# Patient Record
Sex: Male | Born: 1935
Health system: Southern US, Community
[De-identification: ages and names within clinical notes are randomized; demographics above are authoritative.]

## PROBLEM LIST (undated history)

## (undated) DIAGNOSIS — C801 Malignant (primary) neoplasm, unspecified: Secondary | ICD-10-CM

## (undated) DIAGNOSIS — E785 Hyperlipidemia, unspecified: Secondary | ICD-10-CM

## (undated) DIAGNOSIS — K4090 Unilateral inguinal hernia, without obstruction or gangrene, not specified as recurrent: Secondary | ICD-10-CM

## (undated) DIAGNOSIS — IMO0001 Reserved for inherently not codable concepts without codable children: Secondary | ICD-10-CM

## (undated) DIAGNOSIS — C61 Malignant neoplasm of prostate: Secondary | ICD-10-CM

## (undated) DIAGNOSIS — M1990 Unspecified osteoarthritis, unspecified site: Secondary | ICD-10-CM

## (undated) DIAGNOSIS — G9340 Encephalopathy, unspecified: Secondary | ICD-10-CM

## (undated) DIAGNOSIS — D649 Anemia, unspecified: Secondary | ICD-10-CM

## (undated) DIAGNOSIS — I10 Essential (primary) hypertension: Secondary | ICD-10-CM

## (undated) HISTORY — DX: Malignant (primary) neoplasm, unspecified: C80.1

## (undated) HISTORY — DX: Encephalopathy, unspecified: G93.40

## (undated) HISTORY — DX: Hyperlipidemia, unspecified: E78.5

## (undated) HISTORY — DX: Unilateral inguinal hernia, without obstruction or gangrene, not specified as recurrent: K40.90

## (undated) HISTORY — PX: PROSTATE SURGERY: SHX751

## (undated) HISTORY — PX: HERNIA REPAIR: SHX51

## (undated) HISTORY — DX: Anemia, unspecified: D64.9

---

## 1999-10-14 DIAGNOSIS — C801 Malignant (primary) neoplasm, unspecified: Secondary | ICD-10-CM

## 1999-10-14 HISTORY — DX: Malignant (primary) neoplasm, unspecified: C80.1

## 1999-10-14 HISTORY — PX: COLON SURGERY: SHX602

## 2008-05-01 ENCOUNTER — Encounter: Admission: RE | Admit: 2008-05-01 | Discharge: 2008-07-04 | Payer: Self-pay | Admitting: Family Medicine

## 2010-11-26 ENCOUNTER — Inpatient Hospital Stay (HOSPITAL_COMMUNITY)
Admission: EM | Admit: 2010-11-26 | Discharge: 2010-11-29 | DRG: 378 | Disposition: A | Discharge status: Home / Self Care | Payer: Medicare Other | Attending: Internal Medicine | Admitting: Internal Medicine

## 2010-11-26 ENCOUNTER — Emergency Department (HOSPITAL_COMMUNITY): Payer: Medicare Other

## 2010-11-26 DIAGNOSIS — E119 Type 2 diabetes mellitus without complications: Secondary | ICD-10-CM | POA: Diagnosis present

## 2010-11-26 DIAGNOSIS — Z9049 Acquired absence of other specified parts of digestive tract: Secondary | ICD-10-CM

## 2010-11-26 DIAGNOSIS — E876 Hypokalemia: Secondary | ICD-10-CM | POA: Diagnosis not present

## 2010-11-26 DIAGNOSIS — D62 Acute posthemorrhagic anemia: Secondary | ICD-10-CM | POA: Diagnosis present

## 2010-11-26 DIAGNOSIS — D126 Benign neoplasm of colon, unspecified: Secondary | ICD-10-CM | POA: Diagnosis present

## 2010-11-26 DIAGNOSIS — R55 Syncope and collapse: Secondary | ICD-10-CM | POA: Diagnosis present

## 2010-11-26 DIAGNOSIS — K644 Residual hemorrhoidal skin tags: Secondary | ICD-10-CM | POA: Diagnosis present

## 2010-11-26 DIAGNOSIS — Z79899 Other long term (current) drug therapy: Secondary | ICD-10-CM

## 2010-11-26 DIAGNOSIS — Z7982 Long term (current) use of aspirin: Secondary | ICD-10-CM

## 2010-11-26 DIAGNOSIS — Z8546 Personal history of malignant neoplasm of prostate: Secondary | ICD-10-CM

## 2010-11-26 DIAGNOSIS — K5731 Diverticulosis of large intestine without perforation or abscess with bleeding: Principal | ICD-10-CM | POA: Diagnosis present

## 2010-11-26 DIAGNOSIS — I10 Essential (primary) hypertension: Secondary | ICD-10-CM | POA: Diagnosis present

## 2010-11-26 DIAGNOSIS — K648 Other hemorrhoids: Secondary | ICD-10-CM | POA: Diagnosis present

## 2010-11-26 DIAGNOSIS — D72829 Elevated white blood cell count, unspecified: Secondary | ICD-10-CM | POA: Diagnosis present

## 2010-11-26 DIAGNOSIS — E860 Dehydration: Secondary | ICD-10-CM | POA: Diagnosis present

## 2010-11-26 DIAGNOSIS — Z85038 Personal history of other malignant neoplasm of large intestine: Secondary | ICD-10-CM

## 2010-11-26 LAB — APTT: aPTT: 29 seconds (ref 24–37)

## 2010-11-26 LAB — BASIC METABOLIC PANEL
BUN: 15 mg/dL (ref 6–23)
CO2: 28 mEq/L (ref 19–32)
Calcium: 10.2 mg/dL (ref 8.4–10.5)
Chloride: 108 mEq/L (ref 96–112)
Creatinine, Ser: 1.16 mg/dL (ref 0.4–1.5)
GFR calc Af Amer: >60 mL/min (ref >60)
GFR calc non Af Amer: >60 mL/min (ref >60)
Glucose, Bld: 252 mg/dL — ABNORMAL HIGH (ref 70–99)
Potassium: 3.5 mEq/L (ref 3.5–5.1)
Sodium: 141 mEq/L (ref 135–145)

## 2010-11-26 LAB — CK TOTAL AND CKMB (NOT AT ARMC)
CK, MB: 2.1 ng/mL (ref 0.3–4.0)
Relative Index: INVALID (ref 0.0–2.5)
Total CK: 75 U/L (ref 7–232)

## 2010-11-26 LAB — DIFFERENTIAL
Basophils Absolute: 0 10*3/uL (ref 0.0–0.1)
Basophils Relative: 0 % (ref 0–1)
Eosinophils Absolute: 0.2 10*3/uL (ref 0.0–0.7)
Eosinophils Relative: 1 % (ref 0–5)
Lymphocytes Relative: 24 % (ref 12–46)
Lymphs Abs: 3 10*3/uL (ref 0.7–4.0)
Monocytes Absolute: 0.4 10*3/uL (ref 0.1–1.0)
Monocytes Relative: 4 % (ref 3–12)
Neutro Abs: 8.7 10*3/uL — ABNORMAL HIGH (ref 1.7–7.7)
Neutrophils Relative %: 71 % (ref 43–77)

## 2010-11-26 LAB — CBC
HCT: 35.4 % — ABNORMAL LOW (ref 39.0–52.0)
Hemoglobin: 11.9 g/dL — ABNORMAL LOW (ref 13.0–17.0)
MCH: 30.4 pg (ref 26.0–34.0)
MCHC: 33.6 g/dL (ref 30.0–36.0)
MCV: 90.5 fL (ref 78.0–100.0)
Platelets: 210 10*3/uL (ref 150–400)
RBC: 3.91 MIL/uL — ABNORMAL LOW (ref 4.22–5.81)
RDW: 12.4 % (ref 11.5–15.5)
WBC: 12.3 10*3/uL — ABNORMAL HIGH (ref 4.0–10.5)

## 2010-11-26 LAB — TROPONIN I: Troponin I: 0.01 ng/mL (ref 0.00–0.06)

## 2010-11-26 LAB — CEA: CEA: 1.2 ng/mL (ref 0.0–5.0)

## 2010-11-26 LAB — GLUCOSE, CAPILLARY: Glucose-Capillary: 141 mg/dL — ABNORMAL HIGH (ref 70–99)

## 2010-11-26 LAB — PROTIME-INR
INR: 1.18 (ref 0.00–1.49)
Prothrombin Time: 15.2 seconds (ref 11.6–15.2)

## 2010-11-26 LAB — ABO/RH: ABO/RH(D): A POS

## 2010-11-27 DIAGNOSIS — R55 Syncope and collapse: Secondary | ICD-10-CM

## 2010-11-27 DIAGNOSIS — I517 Cardiomegaly: Secondary | ICD-10-CM

## 2010-11-27 LAB — HEMOGLOBIN A1C
Hgb A1c MFr Bld: 5.9 % — ABNORMAL HIGH (ref <5.7)
Mean Plasma Glucose: 123 mg/dL — ABNORMAL HIGH (ref <117)

## 2010-11-27 LAB — COMPREHENSIVE METABOLIC PANEL
ALT: 15 U/L (ref 0–53)
AST: 19 U/L (ref 0–37)
Albumin: 2.9 g/dL — ABNORMAL LOW (ref 3.5–5.2)
Alkaline Phosphatase: 38 U/L — ABNORMAL LOW (ref 39–117)
BUN: 12 mg/dL (ref 6–23)
CO2: 25 mEq/L (ref 19–32)
Calcium: 9.3 mg/dL (ref 8.4–10.5)
Chloride: 112 mEq/L (ref 96–112)
Creatinine, Ser: 0.94 mg/dL (ref 0.4–1.5)
GFR calc Af Amer: >60 mL/min (ref >60)
GFR calc non Af Amer: >60 mL/min (ref >60)
Glucose, Bld: 146 mg/dL — ABNORMAL HIGH (ref 70–99)
Potassium: 3.5 mEq/L (ref 3.5–5.1)
Sodium: 140 mEq/L (ref 135–145)
Total Bilirubin: 0.5 mg/dL (ref 0.3–1.2)
Total Protein: 5.4 g/dL — ABNORMAL LOW (ref 6.0–8.3)

## 2010-11-27 LAB — DIFFERENTIAL
Basophils Absolute: 0 10*3/uL (ref 0.0–0.1)
Basophils Relative: 0 % (ref 0–1)
Eosinophils Absolute: 0.1 10*3/uL (ref 0.0–0.7)
Eosinophils Relative: 2 % (ref 0–5)
Lymphocytes Relative: 25 % (ref 12–46)
Lymphs Abs: 1.7 10*3/uL (ref 0.7–4.0)
Monocytes Absolute: 0.5 10*3/uL (ref 0.1–1.0)
Monocytes Relative: 7 % (ref 3–12)
Neutro Abs: 4.5 10*3/uL (ref 1.7–7.7)
Neutrophils Relative %: 66 % (ref 43–77)

## 2010-11-27 LAB — CARDIAC PANEL(CRET KIN+CKTOT+MB+TROPI)
CK, MB: 1.9 ng/mL (ref 0.3–4.0)
CK, MB: 1.5 ng/mL (ref 0.3–4.0)
Relative Index: INVALID (ref 0.0–2.5)
Relative Index: INVALID (ref 0.0–2.5)
Total CK: 70 U/L (ref 7–232)
Total CK: 67 U/L (ref 7–232)
Troponin I: 0.01 ng/mL (ref 0.00–0.06)
Troponin I: 0.01 ng/mL (ref 0.00–0.06)

## 2010-11-27 LAB — GLUCOSE, CAPILLARY
Glucose-Capillary: 139 mg/dL — ABNORMAL HIGH (ref 70–99)
Glucose-Capillary: 149 mg/dL — ABNORMAL HIGH (ref 70–99)
Glucose-Capillary: 165 mg/dL — ABNORMAL HIGH (ref 70–99)
Glucose-Capillary: 111 mg/dL — ABNORMAL HIGH (ref 70–99)

## 2010-11-27 LAB — CBC
HCT: 27.6 % — ABNORMAL LOW (ref 39.0–52.0)
Hemoglobin: 9.3 g/dL — ABNORMAL LOW (ref 13.0–17.0)
MCH: 30.4 pg (ref 26.0–34.0)
MCHC: 33.7 g/dL (ref 30.0–36.0)
MCV: 90.2 fL (ref 78.0–100.0)
Platelets: 145 10*3/uL — ABNORMAL LOW (ref 150–400)
RBC: 3.06 MIL/uL — ABNORMAL LOW (ref 4.22–5.81)
RDW: 12.6 % (ref 11.5–15.5)
WBC: 6.8 10*3/uL (ref 4.0–10.5)

## 2010-11-27 LAB — MAGNESIUM: Magnesium: 1.7 mg/dL (ref 1.5–2.5)

## 2010-11-28 ENCOUNTER — Other Ambulatory Visit: Payer: Self-pay | Admitting: Gastroenterology

## 2010-11-28 LAB — GLUCOSE, CAPILLARY
Glucose-Capillary: 135 mg/dL — ABNORMAL HIGH (ref 70–99)
Glucose-Capillary: 130 mg/dL — ABNORMAL HIGH (ref 70–99)
Glucose-Capillary: 109 mg/dL — ABNORMAL HIGH (ref 70–99)
Glucose-Capillary: 151 mg/dL — ABNORMAL HIGH (ref 70–99)

## 2010-11-28 LAB — BASIC METABOLIC PANEL
BUN: 6 mg/dL (ref 6–23)
CO2: 25 mEq/L (ref 19–32)
Calcium: 9.1 mg/dL (ref 8.4–10.5)
Chloride: 113 mEq/L — ABNORMAL HIGH (ref 96–112)
Creatinine, Ser: 1 mg/dL (ref 0.4–1.5)
GFR calc Af Amer: >60 mL/min (ref >60)
GFR calc non Af Amer: >60 mL/min (ref >60)
Glucose, Bld: 141 mg/dL — ABNORMAL HIGH (ref 70–99)
Potassium: 3.4 mEq/L — ABNORMAL LOW (ref 3.5–5.1)
Sodium: 144 mEq/L (ref 135–145)

## 2010-11-28 LAB — CBC
HCT: 26.8 % — ABNORMAL LOW (ref 39.0–52.0)
Hemoglobin: 9 g/dL — ABNORMAL LOW (ref 13.0–17.0)
MCH: 30.2 pg (ref 26.0–34.0)
MCHC: 33.6 g/dL (ref 30.0–36.0)
MCV: 89.9 fL (ref 78.0–100.0)
Platelets: 144 10*3/uL — ABNORMAL LOW (ref 150–400)
RBC: 2.98 MIL/uL — ABNORMAL LOW (ref 4.22–5.81)
RDW: 12.5 % (ref 11.5–15.5)
WBC: 5.9 10*3/uL (ref 4.0–10.5)

## 2010-11-29 LAB — CROSSMATCH
ABO/RH(D): A POS
Antibody Screen: NEGATIVE
Unit division: 0
Unit division: 0

## 2010-11-29 LAB — CBC
HCT: 31.4 % — ABNORMAL LOW (ref 39.0–52.0)
Hemoglobin: 10.5 g/dL — ABNORMAL LOW (ref 13.0–17.0)
MCH: 30.2 pg (ref 26.0–34.0)
MCHC: 33.4 g/dL (ref 30.0–36.0)
MCV: 90.2 fL (ref 78.0–100.0)
Platelets: 150 10*3/uL (ref 150–400)
RBC: 3.48 MIL/uL — ABNORMAL LOW (ref 4.22–5.81)
RDW: 13 % (ref 11.5–15.5)
WBC: 5.9 10*3/uL (ref 4.0–10.5)

## 2010-11-29 LAB — GLUCOSE, CAPILLARY: Glucose-Capillary: 135 mg/dL — ABNORMAL HIGH (ref 70–99)

## 2011-11-03 DIAGNOSIS — E119 Type 2 diabetes mellitus without complications: Secondary | ICD-10-CM | POA: Diagnosis not present

## 2011-11-03 DIAGNOSIS — E785 Hyperlipidemia, unspecified: Secondary | ICD-10-CM | POA: Diagnosis not present

## 2011-11-03 DIAGNOSIS — I10 Essential (primary) hypertension: Secondary | ICD-10-CM | POA: Diagnosis not present

## 2012-01-30 ENCOUNTER — Ambulatory Visit (INDEPENDENT_AMBULATORY_CARE_PROVIDER_SITE_OTHER): Payer: Medicare Other | Admitting: Emergency Medicine

## 2012-01-30 VITALS — BP 132/75 | HR 62 | Temp 98.4°F | Resp 16 | Ht 72.5 in | Wt 186.2 lb

## 2012-01-30 DIAGNOSIS — E119 Type 2 diabetes mellitus without complications: Secondary | ICD-10-CM

## 2012-01-30 DIAGNOSIS — E785 Hyperlipidemia, unspecified: Secondary | ICD-10-CM

## 2012-01-30 DIAGNOSIS — I10 Essential (primary) hypertension: Secondary | ICD-10-CM | POA: Diagnosis not present

## 2012-01-30 DIAGNOSIS — E0590 Thyrotoxicosis, unspecified without thyrotoxic crisis or storm: Secondary | ICD-10-CM | POA: Insufficient documentation

## 2012-01-30 DIAGNOSIS — L700 Acne vulgaris: Secondary | ICD-10-CM

## 2012-01-30 DIAGNOSIS — K469 Unspecified abdominal hernia without obstruction or gangrene: Secondary | ICD-10-CM | POA: Diagnosis not present

## 2012-01-30 DIAGNOSIS — E118 Type 2 diabetes mellitus with unspecified complications: Secondary | ICD-10-CM | POA: Insufficient documentation

## 2012-01-30 LAB — COMPREHENSIVE METABOLIC PANEL
ALT: 11 U/L (ref 0–53)
AST: 17 U/L (ref 0–37)
Albumin: 4.3 g/dL (ref 3.5–5.2)
Alkaline Phosphatase: 54 U/L (ref 39–117)
BUN: 17 mg/dL (ref 6–23)
CO2: 28 mEq/L (ref 19–32)
Calcium: 10.3 mg/dL (ref 8.4–10.5)
Chloride: 102 mEq/L (ref 96–112)
Creat: 1.27 mg/dL (ref 0.50–1.35)
Glucose, Bld: 146 mg/dL — ABNORMAL HIGH (ref 70–99)
Potassium: 4.1 mEq/L (ref 3.5–5.3)
Sodium: 138 mEq/L (ref 135–145)
Total Bilirubin: 0.7 mg/dL (ref 0.3–1.2)
Total Protein: 7.5 g/dL (ref 6.0–8.3)

## 2012-01-30 LAB — POCT GLYCOSYLATED HEMOGLOBIN (HGB A1C): Hemoglobin A1C: 7.3

## 2012-01-30 LAB — POCT CBC
Granulocyte percent: 65.9 %G (ref 37–80)
HCT, POC: 42.2 % — AB (ref 43.5–53.7)
Hemoglobin: 13.6 g/dL — AB (ref 14.1–18.1)
Lymph, poc: 1.9 (ref 0.6–3.4)
MCH, POC: 27.6 pg (ref 27–31.2)
MCHC: 32.2 g/dL (ref 31.8–35.4)
MCV: 85.6 fL (ref 80–97)
MID (cbc): 0.4 (ref 0–0.9)
MPV: 9.2 fL (ref 0–99.8)
POC Granulocyte: 4.4 (ref 2–6.9)
POC LYMPH PERCENT: 28.5 %L (ref 10–50)
POC MID %: 5.6 %M (ref 0–12)
Platelet Count, POC: 234 10*3/uL (ref 142–424)
RBC: 4.93 M/uL (ref 4.69–6.13)
RDW, POC: 14.7 %
WBC: 6.7 10*3/uL (ref 4.6–10.2)

## 2012-01-30 LAB — LIPID PANEL
Cholesterol: 152 mg/dL (ref 0–200)
HDL: 49 mg/dL (ref >39)
LDL Cholesterol: 85 mg/dL (ref 0–99)
Total CHOL/HDL Ratio: 3.1 Ratio
Triglycerides: 90 mg/dL (ref <150)
VLDL: 18 mg/dL (ref 0–40)

## 2012-01-30 LAB — GLUCOSE, POCT (MANUAL RESULT ENTRY): POC Glucose: 148

## 2012-01-30 MED ORDER — AMLODIPINE BESYLATE 10 MG PO TABS: 10.0000 mg | ORAL_TABLET | Freq: Every day | ORAL | Status: DC

## 2012-01-30 MED ORDER — ATORVASTATIN CALCIUM 10 MG PO TABS: 10.0000 mg | ORAL_TABLET | Freq: Every day | ORAL | Status: DC

## 2012-01-30 MED ORDER — METFORMIN HCL 500 MG PO TABS: 500.0000 mg | ORAL_TABLET | Freq: Two times a day (BID) | ORAL | Status: DC

## 2012-01-30 MED ORDER — METHIMAZOLE 10 MG PO TABS: 10.0000 mg | ORAL_TABLET | ORAL | Status: DC

## 2012-01-30 MED ORDER — LISINOPRIL-HYDROCHLOROTHIAZIDE 20-12.5 MG PO TABS: 1.0000 | ORAL_TABLET | Freq: Every day | ORAL | Status: DC

## 2012-01-30 NOTE — Progress Notes (Signed)
  Subjective:    Patient ID: Theodore Frank, male    DOB: 1936-09-08, 76 y.o.   MRN: [id number]  HPI patient has a history high cholesterol high blood pressure hyperlipidemia and hypothyroidism. He is a patient [facility] is however has been unable to get his medications since [facility] closed in today for recheck of his blood pressure and refill of his medications.    Review of Systems he states he feels well he is not having chest pain shortness of breath or any complaints at the present time.     Objective:   Physical Exam Physical exam reveals an alert gentleman who is in no distress. His neck is supple thyroid is not enlarged. Chest is clear to auscultation and percussion. Heart regular rate no murmurs. Abdomen soft no tenderness. There is a probable right inguinal hernia present. There is a large comedone over his back . He does have a midline scar lower abdomen where he says he's had surgery for colon cancer.       Assessment & Plan:  Meds refills for one year. I placed him on a 10 mg dose of Tapazole until I get his records. I did do thyroid studies. He is a history of colon: Cancer and is followed by Dr. Loreta Ave.

## 2012-02-23 DIAGNOSIS — L723 Sebaceous cyst: Secondary | ICD-10-CM | POA: Diagnosis not present

## 2012-02-25 ENCOUNTER — Encounter (INDEPENDENT_AMBULATORY_CARE_PROVIDER_SITE_OTHER): Payer: Self-pay | Admitting: General Surgery

## 2012-02-25 ENCOUNTER — Telehealth (INDEPENDENT_AMBULATORY_CARE_PROVIDER_SITE_OTHER): Payer: Self-pay | Admitting: General Surgery

## 2012-02-25 ENCOUNTER — Ambulatory Visit (INDEPENDENT_AMBULATORY_CARE_PROVIDER_SITE_OTHER): Payer: Medicare Other | Admitting: General Surgery

## 2012-02-25 VITALS — BP 110/78 | HR 72 | Temp 97.7°F | Resp 18 | Ht 74.0 in | Wt 180.5 lb

## 2012-02-25 DIAGNOSIS — K4090 Unilateral inguinal hernia, without obstruction or gangrene, not specified as recurrent: Secondary | ICD-10-CM | POA: Diagnosis not present

## 2012-02-25 HISTORY — DX: Unilateral inguinal hernia, without obstruction or gangrene, not specified as recurrent: K40.90

## 2012-02-25 NOTE — Progress Notes (Signed)
Patient ID: Theodore Frank, male   DOB: 12/29/1935, 76 y.o.   MRN: [id number]  Chief Complaint  Patient presents with  . Inguinal Hernia    HPI Vijay Lederman is a 76 y.o. male.   [facility] little over a year ago patient developed some pain in his right groin. He was evaluated and found to have a small right inguinal hernia. He saw a physician regarding repair at that time. It was felt to be small. The surgery was scheduled. More recently, he developed an upper respiratory infection which involve a lot of coughing. The hernia became larger. He was seen by Dr. Daub and referred for consultation regarding right inguinal hernia.Localized pain but no change in bowel or bladder habits.  Past Medical History  Diagnosis Date  . Cancer     colon  . Diabetes mellitus   . Hyperlipidemia     No past surgical history on file.  No family history on file.  Social History History  Substance Use Topics  . Smoking status: Former Smoker    Quit date: 10/13/1965  . Smokeless tobacco: Not on file  . Alcohol Use: No    No Known Allergies  Current Outpatient Prescriptions  Medication Sig Dispense Refill  . amLODipine (NORVASC) 10 MG tablet Take 1 tablet (10 mg total) by mouth daily.  90 tablet  3  . atorvastatin (LIPITOR) 10 MG tablet Take 1 tablet (10 mg total) by mouth daily.  90 tablet  3  . lisinopril-hydrochlorothiazide (PRINZIDE,ZESTORETIC) 20-12.5 MG per tablet Take 1 tablet by mouth daily.  90 tablet  3  . metFORMIN (GLUCOPHAGE) 500 MG tablet Take 1 tablet (500 mg total) by mouth 2 (two) times daily with a meal.  180 tablet  3  . methimazole (TAPAZOLE) 10 MG tablet Take 1 tablet (10 mg total) by mouth 1 day or 1 dose.  90 tablet  3    Review of Systems Review of Systems  Constitutional: Negative for fever, chills and unexpected weight change.  HENT: Negative for hearing loss, congestion, sore throat, trouble swallowing and voice change.   Eyes: Negative for visual disturbance.  Respiratory:  Negative for cough and wheezing.   Cardiovascular: Negative for chest pain, palpitations and leg swelling.  Gastrointestinal: Negative for nausea, vomiting, abdominal pain, diarrhea, constipation, blood in stool, abdominal distention, anal bleeding and rectal pain.  Genitourinary: Negative for hematuria and difficulty urinating.       Erectile dysfunction, right inguinal hernia  Musculoskeletal: Negative for arthralgias.  Skin: Negative for rash and wound.  Neurological: Negative for seizures, syncope, weakness and headaches.  Hematological: Negative for adenopathy. Does not bruise/bleed easily.  Psychiatric/Behavioral: Negative for confusion.    Blood pressure 110/78, pulse 72, temperature 97.7 F (36.5 C), temperature source Temporal, resp. rate 18, height 6' 2" (1.88 m), weight 180 lb 8 oz (81.874 kg).  Physical Exam Physical Exam  Constitutional: He is oriented to person, place, and time. He appears well-developed and well-nourished. No distress.  HENT:  Head: Normocephalic and atraumatic.  Eyes: EOM are normal. Pupils are equal, round, and reactive to light. No scleral icterus.  Neck: Normal range of motion. Neck supple. No tracheal deviation present.  Cardiovascular: Normal rate, regular rhythm and normal heart sounds.   No murmur heard. Pulmonary/Chest: Effort normal and breath sounds normal. No stridor. No respiratory distress. He has no wheezes. He has no rales.  Abdominal: Soft. Bowel sounds are normal. He exhibits no distension. There is no tenderness. There is no rebound and   no guarding.       Inguinal exam reveals small right inguinal hernia which reduces easily but recurs spontaneously, left inguinal exam reveals no hernia, Testes are both descended and nonedematous  Genitourinary: Penis normal.  Musculoskeletal: Normal range of motion. He exhibits no edema.  Neurological: He is alert and oriented to person, place, and time. Coordination normal.  Skin: Skin is warm and  dry.    Data Reviewed Office notes  Assessment    Symptomatic right inguinal hernia    Plan    I have offered right inguinal hernia repair with mesh. Procedure, risks, benefits were discussed in detail with the patient. He was given literature on hernias and the procedure for repair. He is aware he will need to hold off on heavy lifting for 6 weeks after surgery. I answered his questions. He is agreeable.       Mahathi Pokorney E 02/25/2012, 9:48 AM    

## 2012-02-26 ENCOUNTER — Encounter (INDEPENDENT_AMBULATORY_CARE_PROVIDER_SITE_OTHER): Payer: Self-pay

## 2012-03-02 ENCOUNTER — Encounter (HOSPITAL_BASED_OUTPATIENT_CLINIC_OR_DEPARTMENT_OTHER)
Admission: RE | Admit: 2012-03-02 | Discharge: 2012-03-02 | Disposition: A | Discharge status: Home / Self Care | Payer: Medicare Other | Source: Ambulatory Visit | Attending: General Surgery | Admitting: General Surgery

## 2012-03-02 ENCOUNTER — Encounter (HOSPITAL_BASED_OUTPATIENT_CLINIC_OR_DEPARTMENT_OTHER): Payer: Self-pay | Admitting: *Deleted

## 2012-03-02 DIAGNOSIS — E119 Type 2 diabetes mellitus without complications: Secondary | ICD-10-CM | POA: Diagnosis not present

## 2012-03-02 DIAGNOSIS — E0590 Thyrotoxicosis, unspecified without thyrotoxic crisis or storm: Secondary | ICD-10-CM | POA: Diagnosis not present

## 2012-03-02 DIAGNOSIS — Z01810 Encounter for preprocedural cardiovascular examination: Secondary | ICD-10-CM | POA: Diagnosis not present

## 2012-03-02 DIAGNOSIS — I10 Essential (primary) hypertension: Secondary | ICD-10-CM | POA: Diagnosis not present

## 2012-03-02 DIAGNOSIS — E785 Hyperlipidemia, unspecified: Secondary | ICD-10-CM | POA: Diagnosis not present

## 2012-03-02 DIAGNOSIS — K4090 Unilateral inguinal hernia, without obstruction or gangrene, not specified as recurrent: Secondary | ICD-10-CM | POA: Diagnosis not present

## 2012-03-02 DIAGNOSIS — Z85038 Personal history of other malignant neoplasm of large intestine: Secondary | ICD-10-CM | POA: Diagnosis not present

## 2012-03-02 LAB — BASIC METABOLIC PANEL
BUN: 21 mg/dL (ref 6–23)
CO2: 27 mEq/L (ref 19–32)
Calcium: 10.8 mg/dL — ABNORMAL HIGH (ref 8.4–10.5)
Chloride: 103 mEq/L (ref 96–112)
Creatinine, Ser: 1.15 mg/dL (ref 0.50–1.35)
GFR calc Af Amer: 71 mL/min — ABNORMAL LOW (ref >90)
GFR calc non Af Amer: 61 mL/min — ABNORMAL LOW (ref >90)
Glucose, Bld: 133 mg/dL — ABNORMAL HIGH (ref 70–99)
Potassium: 3.7 mEq/L (ref 3.5–5.1)
Sodium: 140 mEq/L (ref 135–145)

## 2012-03-02 NOTE — Progress Notes (Signed)
Pt here ekg and bmet done

## 2012-03-03 NOTE — Progress Notes (Signed)
Dr. Gelene Mink reviewed EKG yesterday--- OK for surgery

## 2012-03-05 ENCOUNTER — Encounter (HOSPITAL_BASED_OUTPATIENT_CLINIC_OR_DEPARTMENT_OTHER): Payer: Self-pay | Admitting: Anesthesiology

## 2012-03-05 ENCOUNTER — Ambulatory Visit (HOSPITAL_BASED_OUTPATIENT_CLINIC_OR_DEPARTMENT_OTHER)
Admission: RE | Admit: 2012-03-05 | Discharge: 2012-03-05 | Disposition: A | Discharge status: Home / Self Care | Payer: Medicare Other | Source: Ambulatory Visit | Attending: General Surgery | Admitting: General Surgery

## 2012-03-05 ENCOUNTER — Ambulatory Visit (HOSPITAL_BASED_OUTPATIENT_CLINIC_OR_DEPARTMENT_OTHER): Payer: Medicare Other | Admitting: Anesthesiology

## 2012-03-05 ENCOUNTER — Encounter (HOSPITAL_BASED_OUTPATIENT_CLINIC_OR_DEPARTMENT_OTHER)
Admission: RE | Disposition: A | Discharge status: Home / Self Care | Payer: Self-pay | Source: Ambulatory Visit | Attending: General Surgery

## 2012-03-05 ENCOUNTER — Encounter (HOSPITAL_BASED_OUTPATIENT_CLINIC_OR_DEPARTMENT_OTHER): Payer: Self-pay | Admitting: *Deleted

## 2012-03-05 DIAGNOSIS — G8918 Other acute postprocedural pain: Secondary | ICD-10-CM | POA: Diagnosis not present

## 2012-03-05 DIAGNOSIS — I10 Essential (primary) hypertension: Secondary | ICD-10-CM | POA: Diagnosis not present

## 2012-03-05 DIAGNOSIS — Z85038 Personal history of other malignant neoplasm of large intestine: Secondary | ICD-10-CM | POA: Insufficient documentation

## 2012-03-05 DIAGNOSIS — E119 Type 2 diabetes mellitus without complications: Secondary | ICD-10-CM | POA: Diagnosis not present

## 2012-03-05 DIAGNOSIS — Z01810 Encounter for preprocedural cardiovascular examination: Secondary | ICD-10-CM | POA: Insufficient documentation

## 2012-03-05 DIAGNOSIS — K4090 Unilateral inguinal hernia, without obstruction or gangrene, not specified as recurrent: Secondary | ICD-10-CM

## 2012-03-05 DIAGNOSIS — E0590 Thyrotoxicosis, unspecified without thyrotoxic crisis or storm: Secondary | ICD-10-CM | POA: Insufficient documentation

## 2012-03-05 DIAGNOSIS — R109 Unspecified abdominal pain: Secondary | ICD-10-CM | POA: Diagnosis not present

## 2012-03-05 DIAGNOSIS — E785 Hyperlipidemia, unspecified: Secondary | ICD-10-CM | POA: Insufficient documentation

## 2012-03-05 HISTORY — DX: Malignant neoplasm of prostate: C61

## 2012-03-05 HISTORY — DX: Essential (primary) hypertension: I10

## 2012-03-05 HISTORY — PX: INGUINAL HERNIA REPAIR: SHX194

## 2012-03-05 LAB — GLUCOSE, CAPILLARY
Glucose-Capillary: 155 mg/dL — ABNORMAL HIGH (ref 70–99)
Glucose-Capillary: 141 mg/dL — ABNORMAL HIGH (ref 70–99)

## 2012-03-05 LAB — POCT HEMOGLOBIN-HEMACUE: Hemoglobin: 11.7 g/dL — ABNORMAL LOW (ref 13.0–17.0)

## 2012-03-05 SURGERY — REPAIR, HERNIA, INGUINAL, ADULT
Anesthesia: General | Site: Groin | Laterality: Right | Wound class: Clean

## 2012-03-05 MED ORDER — EPHEDRINE SULFATE 50 MG/ML IJ SOLN
INTRAMUSCULAR | Status: DC | PRN
  Administered 2012-03-05: 10 mg via INTRAVENOUS

## 2012-03-05 MED ORDER — OXYCODONE-ACETAMINOPHEN 5-325 MG PO TABS: 1.0000 | ORAL_TABLET | ORAL | Status: AC | PRN

## 2012-03-05 MED ORDER — CEFAZOLIN SODIUM-DEXTROSE 2-3 GM-% IV SOLR
2.0000 g | INTRAVENOUS | Status: AC
  Administered 2012-03-05: 2 g via INTRAVENOUS

## 2012-03-05 MED ORDER — CHLORHEXIDINE GLUCONATE 4 % EX LIQD: 1.0000 "application " | Freq: Once | CUTANEOUS | Status: DC

## 2012-03-05 MED ORDER — 0.9 % SODIUM CHLORIDE (POUR BTL) OPTIME
TOPICAL | Status: DC | PRN
  Administered 2012-03-05: 200 mL

## 2012-03-05 MED ORDER — ONDANSETRON HCL 4 MG/2ML IJ SOLN
INTRAMUSCULAR | Status: DC | PRN
  Administered 2012-03-05: 4 mg via INTRAVENOUS

## 2012-03-05 MED ORDER — DEXAMETHASONE SODIUM PHOSPHATE 4 MG/ML IJ SOLN
INTRAMUSCULAR | Status: DC | PRN
  Administered 2012-03-05: 10 mg via INTRAVENOUS

## 2012-03-05 MED ORDER — LACTATED RINGERS IV SOLN
INTRAVENOUS | Status: DC
  Administered 2012-03-05 (×3): via INTRAVENOUS

## 2012-03-05 MED ORDER — FENTANYL CITRATE 0.05 MG/ML IJ SOLN
50.0000 ug | INTRAMUSCULAR | Status: DC | PRN
  Administered 2012-03-05: 50 ug via INTRAVENOUS

## 2012-03-05 MED ORDER — MIDAZOLAM HCL 2 MG/2ML IJ SOLN
0.5000 mg | INTRAMUSCULAR | Status: DC | PRN
  Administered 2012-03-05: 2 mg via INTRAVENOUS

## 2012-03-05 MED ORDER — LIDOCAINE HCL (CARDIAC) 20 MG/ML IV SOLN
INTRAVENOUS | Status: DC | PRN
  Administered 2012-03-05: 50 mg via INTRAVENOUS

## 2012-03-05 MED ORDER — ACETAMINOPHEN 10 MG/ML IV SOLN
1000.0000 mg | Freq: Once | INTRAVENOUS | Status: AC
  Administered 2012-03-05: 1000 mg via INTRAVENOUS

## 2012-03-05 MED ORDER — PROPOFOL 10 MG/ML IV EMUL
INTRAVENOUS | Status: DC | PRN
  Administered 2012-03-05: 200 mg via INTRAVENOUS

## 2012-03-05 MED ORDER — KETOROLAC TROMETHAMINE 30 MG/ML IJ SOLN
INTRAMUSCULAR | Status: DC | PRN
  Administered 2012-03-05: 30 mg via INTRAVENOUS

## 2012-03-05 MED ORDER — BUPIVACAINE-EPINEPHRINE 0.5% -1:200000 IJ SOLN
INTRAMUSCULAR | Status: DC | PRN
  Administered 2012-03-05: 10 mL

## 2012-03-05 MED ORDER — BUPIVACAINE HCL (PF) 0.5 % IJ SOLN
INTRAMUSCULAR | Status: DC | PRN
  Administered 2012-03-05: 20 mL

## 2012-03-05 SURGICAL SUPPLY — 47 items
BLADE HEX COATED 2.75 (ELECTRODE) ×2 IMPLANT
BLADE SURG 10 STRL SS (BLADE) IMPLANT
BLADE SURG 15 STRL LF DISP TIS (BLADE) ×1 IMPLANT
BLADE SURG 15 STRL SS (BLADE) ×1
BLADE SURG ROTATE 9660 (MISCELLANEOUS) ×2 IMPLANT
CANISTER SUCTION 1200CC (MISCELLANEOUS) ×2 IMPLANT
CHLORAPREP W/TINT 26ML (MISCELLANEOUS) ×2 IMPLANT
CLOTH BEACON ORANGE TIMEOUT ST (SAFETY) ×2 IMPLANT
COVER MAYO STAND STRL (DRAPES) ×2 IMPLANT
COVER TABLE BACK 60X90 (DRAPES) ×2 IMPLANT
DECANTER SPIKE VIAL GLASS SM (MISCELLANEOUS) ×2 IMPLANT
DERMABOND ADVANCED (GAUZE/BANDAGES/DRESSINGS) ×1
DERMABOND ADVANCED .7 DNX12 (GAUZE/BANDAGES/DRESSINGS) ×1 IMPLANT
DRAIN PENROSE 1/2X12 LTX STRL (WOUND CARE) ×2 IMPLANT
DRAPE PED LAPAROTOMY (DRAPES) ×2 IMPLANT
DRAPE UTILITY XL STRL (DRAPES) ×2 IMPLANT
ELECT REM PT RETURN 9FT ADLT (ELECTROSURGICAL) ×2
ELECTRODE REM PT RTRN 9FT ADLT (ELECTROSURGICAL) ×1 IMPLANT
GLOVE BIO SURGEON STRL SZ 6.5 (GLOVE) ×4 IMPLANT
GLOVE BIO SURGEON STRL SZ8 (GLOVE) ×2 IMPLANT
GLOVE BIOGEL PI IND STRL 8 (GLOVE) ×1 IMPLANT
GLOVE BIOGEL PI INDICATOR 8 (GLOVE) ×1
GLOVE INDICATOR 7.0 STRL GRN (GLOVE) ×4 IMPLANT
GOWN PREVENTION PLUS XLARGE (GOWN DISPOSABLE) ×2 IMPLANT
GOWN PREVENTION PLUS XXLARGE (GOWN DISPOSABLE) ×2 IMPLANT
MESH HERNIA 3X6 (Mesh General) ×2 IMPLANT
NEEDLE HYPO 25X1 1.5 SAFETY (NEEDLE) ×2 IMPLANT
NS IRRIG 1000ML POUR BTL (IV SOLUTION) IMPLANT
PACK BASIN DAY SURGERY FS (CUSTOM PROCEDURE TRAY) ×2 IMPLANT
PENCIL BUTTON HOLSTER BLD 10FT (ELECTRODE) ×2 IMPLANT
SLEEVE SCD COMPRESS KNEE MED (MISCELLANEOUS) ×2 IMPLANT
SPONGE LAP 18X18 X RAY DECT (DISPOSABLE) IMPLANT
SPONGE LAP 4X18 X RAY DECT (DISPOSABLE) ×2 IMPLANT
SUT MON AB 4-0 PC3 18 (SUTURE) ×2 IMPLANT
SUT PROLENE 0 CT 2 (SUTURE) ×6 IMPLANT
SUT VIC AB 2-0 SH 27 (SUTURE) ×2
SUT VIC AB 2-0 SH 27XBRD (SUTURE) ×2 IMPLANT
SUT VIC AB 3-0 SH 27 (SUTURE) ×1
SUT VIC AB 3-0 SH 27X BRD (SUTURE) ×1 IMPLANT
SUT VICRYL AB 2 0 TIE (SUTURE) IMPLANT
SUT VICRYL AB 2 0 TIES (SUTURE)
SYR CONTROL 10ML LL (SYRINGE) ×2 IMPLANT
TOWEL OR 17X24 6PK STRL BLUE (TOWEL DISPOSABLE) ×2 IMPLANT
TOWEL OR NON WOVEN STRL DISP B (DISPOSABLE) ×2 IMPLANT
TUBE CONNECTING 20X1/4 (TUBING) ×2 IMPLANT
WATER STERILE IRR 1000ML POUR (IV SOLUTION) IMPLANT
YANKAUER SUCT BULB TIP NO VENT (SUCTIONS) ×2 IMPLANT

## 2012-03-05 NOTE — Transfer of Care (Signed)
Immediate Anesthesia Transfer of Care Note  Patient: Theodore Frank  Procedure(s) Performed: Procedure(s) (LRB): HERNIA REPAIR INGUINAL ADULT (Right) INSERTION OF MESH (Right)  Patient Location: PACU  Anesthesia Type: GA combined with regional for post-op pain  Level of Consciousness: sedated  Airway & Oxygen Therapy: Patient Spontanous Breathing and Patient connected to face mask oxygen  Post-op Assessment: Report given to PACU RN and Post -op Vital signs reviewed and stable  Post vital signs: Reviewed and stable  Complications: No apparent anesthesia complications

## 2012-03-05 NOTE — Discharge Instructions (Addendum)
Post Anesthesia Home Care Instructions  Activity: Get plenty of rest for the remainder of the day. A responsible adult should stay with you for 24 hours following the procedure.  For the next 24 hours, DO NOT: -Drive a car -Advertising copywriter -Drink alcoholic beverages -Take any medication unless instructed by your physician -Make any legal decisions or sign important papers.  Meals: Start with liquid foods such as gelatin or soup. Progress to regular foods as tolerated. Avoid greasy, spicy, heavy foods. If nausea and/or vomiting occur, drink only clear liquids until the nausea and/or vomiting subsides. Call your physician if vomiting continues.  Special Instructions/Symptoms: Your throat may feel dry or sore from the anesthesia or the breathing tube placed in your throat during surgery. If this causes discomfort, gargle with warm salt water. The discomfort should disappear within 24 hours.  Hernia Repair Care After These instructions give you information on caring for yourself after your procedure. Your doctor may also give you more specific instructions. Call your doctor if you have any problems or questions after your procedure. HOME CARE   You may have changes in your poops (bowel movements).   You may have loose or watery poop (diarrhea).   You may be not able to poop.   Your bowels will slowly get back to normal.   Do not eat any food that makes you sick to your stomach (nauseous). Eat small meals 4 to 6 times a day instead of 3 large ones.   Do not drink pop. It will give you gas.   Do not drink alcohol.   Do not lift anything heavier than 10 pounds. This is about the weight of a gallon of milk.   Do not do anything that makes you very tired for at least 6 weeks.   Do not get your wound wet for 2 days.   You may take a sponge bath during this time.   After 2 days you may take a shower. Gently pat your surgical cut (incision) dry with a towel. Do not rub it.   For  men: You may have been given an athletic supporter (scrotal support) before you left the hospital. It holds your scrotum and testicles closer to your body so there is no strain on your wound. Wear the supporter until your doctor tells you that you do not need it anymore.  GET HELP RIGHT AWAY IF:  You have watery poop, or cannot poop for more than 3 days.   You feel sick to your stomach or throw up (vomit) more than 2 or 3 times.   You have temperature by mouth above 102 F (38.9 C).   You see redness or puffiness (swelling) around your wound.   You see yellowish white fluid (pus) coming from your wound.   You see a bulge or bump in your lower belly (abdomen) or near your groin.   You develop a rash, trouble breathing, or any other symptoms from medicines taken.  MAKE SURE YOU:  Understand these instructions.   Will watch your condition.   Will get help right away if your are not doing well or get worse.  Document Released: 09/11/2008 Document Revised: 09/18/2011 Document Reviewed: 09/11/2008 [facility] Patient Information 2012 [place], [place].Hernia Repair Care After These instructions give you information on caring for yourself after your procedure. Your doctor may also give you more specific instructions. Call your doctor if you have any problems or questions after your procedure. HOME CARE   You may have  changes in your poops (bowel movements).   You may have loose or watery poop (diarrhea).   You may be not able to poop.   Your bowels will slowly get back to normal.   Do not eat any food that makes you sick to your stomach (nauseous). Eat small meals 4 to 6 times a day instead of 3 large ones.   Do not drink pop. It will give you gas.   Do not drink alcohol.   Do not lift anything heavier than 10 pounds. This is about the weight of a gallon of milk.   Do not do anything that makes you very tired for at least 6 weeks.   Do not get your wound wet for 2 days.   You  may take a sponge bath during this time.   After 2 days you may take a shower. Gently pat your surgical cut (incision) dry with a towel. Do not rub it.   For men: You may have been given an athletic supporter (scrotal support) before you left the hospital. It holds your scrotum and testicles closer to your body so there is no strain on your wound. Wear the supporter until your doctor tells you that you do not need it anymore.  GET HELP RIGHT AWAY IF:  You have watery poop, or cannot poop for more than 3 days.   You feel sick to your stomach or throw up (vomit) more than 2 or 3 times.   You have temperature by mouth above 102 F (38.9 C).   You see redness or puffiness (swelling) around your wound.   You see yellowish white fluid (pus) coming from your wound.   You see a bulge or bump in your lower belly (abdomen) or near your groin.   You develop a rash, trouble breathing, or any other symptoms from medicines taken.  MAKE SURE YOU:  Understand these instructions.   Will watch your condition.   Will get help right away if your are not doing well or get worse.  Document Released: 09/11/2008 Document Revised: 09/18/2011 Document Reviewed: 09/11/2008 [facility] Patient Information 2012 [place], [place].Hernia, Surgical Repair A hernia occurs when an internal organ pushes out through a weak spot in the belly (abdominal) wall muscles. Hernias commonly occur in the groin and around the navel. Hernias often can be pushed back into place (reduced). Most hernias tend to get worse over time. Problems occur when abdominal contents get stuck in the opening (incarcerated hernia). The blood supply gets cut off (strangulated hernia). This is an emergency and needs surgery. Otherwise, hernia repair can be an elective procedure. This means you can schedule this at your convenience when an emergency is not present. Because complications can occur, if you decide to repair the hernia, it is best to do it soon.  When it becomes an emergency procedure, there is increased risk of complications after surgery. CAUSES   Heavy lifting.   Obesity.   Prolonged coughing.   Straining to move your bowels.   Hernias can also occur through a cut (incision) by a surgeonafter an abdominal operation.  HOME CARE INSTRUCTIONS Before the repair:  Bed rest is not required. You may continue your normal activities, but avoid heavy lifting (more than 10 pounds) or straining. Cough gently. If you are a smoker, it is best to stop. Even the best hernia repair can break down with the continual strain of coughing.   Do not wear anything tight over your hernia. Do not try  to keep it in with an outside bandage or truss. These can damage abdominal contents if they are trapped in the hernia sac.   Eat a normal diet. Avoid constipation. Straining over long periods of time to have a bowel movement will increase hernia size. It also can breakdown repairs. If you cannot do this with diet alone, laxatives or stool softeners may be used.  PRIOR TO SURGERY, SEEK IMMEDIATE MEDICAL CARE IF: You have problems (symptoms) of a trapped (incarcerated) hernia. Symptoms include:  An oral temperature above 102 F (38.9 C) develops, or as your caregiver suggests.   Increasing abdominal pain.   Feeling sick to your stomach(nausea) and vomiting.   You stop passing gas or stool.   The hernia is stuck outside the abdomen, looks discolored, feels hard, or is tender.   You have any changes in your bowel habits or in the hernia that is unusual for you.  LET YOUR CAREGIVERS KNOW ABOUT THE FOLLOWING:  Allergies.   Medications taken including herbs, eye drops, over the counter medications, and creams.   Use of steroids (by mouth or creams).   Family or personal history of problems with anesthetics or Novocaine.   Possibility of pregnancy, if this applies.   Personal history of blood clots (thrombophlebitis).   Family or personal  history of bleeding or blood problems.   Previous surgery.   Other health problems.  BEFORE THE PROCEDURE You should be present 1 hour prior to your procedure, or as directed by your caregiver.  AFTER THE PROCEDURE After surgery, you will be taken to the recovery area. A nurse will watch and check your progress there. Once you are awake, stable, and taking fluids well, you will be allowed to go home as long as there are no problems. Once home, an ice pack (wrapped in a light towel) applied to your operative site may help with discomfort. It may also keep the swelling down. Do not lift anything heavier than 10 pounds (4.55 kilograms). Take showers not baths. Do not drive while taking narcotics. Follow instructions as suggested by your caregiver.  SEEK IMMEDIATE MEDICAL CARE IF: After surgery:  There is redness, swelling, or increasing pain in the wound.   There is pus coming from the wound.   There is drainage from a wound lasting longer than 1 day.   An unexplained oral temperature above 102 F (38.9 C) develops.   You notice a foul smell coming from the wound or dressing.   There is a breaking open of a wound (edged not staying together) after the sutures have been removed.   You notice increasing pain in the shoulders (shoulder strap areas).   You develop dizzy episodes or fainting while standing.   You develop persistent nausea or vomiting.   You develop a rash.   You have difficulty breathing.   You develop any reaction or side effects to medications given.  MAKE SURE YOU:   Understand these instructions.   Will watch your condition.   Will get help right away if you are not doing well or get worse.  Document Released: 03/25/2001 Document Revised: 09/18/2011 Document Reviewed: 02/15/2008 [facility] Patient Information 2012 [place], [place].Hernia Repair Care After These instructions give you information on caring for yourself after your procedure. Your doctor may also  give you more specific instructions. Call your doctor if you have any problems or questions after your procedure. HOME CARE   You may have changes in your poops (bowel movements).   You  may have loose or watery poop (diarrhea).   You may be not able to poop.   Your bowels will slowly get back to normal.   Do not eat any food that makes you sick to your stomach (nauseous). Eat small meals 4 to 6 times a day instead of 3 large ones.   Do not drink pop. It will give you gas.   Do not drink alcohol.   Do not lift anything heavier than 10 pounds. This is about the weight of a gallon of milk.   Do not do anything that makes you very tired for at least 6 weeks.   Do not get your wound wet for 2 days.   You may take a sponge bath during this time.   After 2 days you may take a shower. Gently pat your surgical cut (incision) dry with a towel. Do not rub it.   For men: You may have been given an athletic supporter (scrotal support) before you left the hospital. It holds your scrotum and testicles closer to your body so there is no strain on your wound. Wear the supporter until your doctor tells you that you do not need it anymore.  GET HELP RIGHT AWAY IF:  You have watery poop, or cannot poop for more than 3 days.   You feel sick to your stomach or throw up (vomit) more than 2 or 3 times.   You have temperature by mouth above 102 F (38.9 C).   You see redness or puffiness (swelling) around your wound.   You see yellowish white fluid (pus) coming from your wound.   You see a bulge or bump in your lower belly (abdomen) or near your groin.   You develop a rash, trouble breathing, or any other symptoms from medicines taken.  MAKE SURE YOU:  Understand these instructions.   Will watch your condition.   Will get help right away if your are not doing well or get worse.  Document Released: 09/11/2008 Document Revised: 09/18/2011 Document Reviewed: 09/11/2008 [facility] Patient  Information 2012 [place], [place].Hernia, Surgical Repair A hernia occurs when an internal organ pushes out through a weak spot in the belly (abdominal) wall muscles. Hernias commonly occur in the groin and around the navel. Hernias often can be pushed back into place (reduced). Most hernias tend to get worse over time. Problems occur when abdominal contents get stuck in the opening (incarcerated hernia). The blood supply gets cut off (strangulated hernia). This is an emergency and needs surgery. Otherwise, hernia repair can be an elective procedure. This means you can schedule this at your convenience when an emergency is not present. Because complications can occur, if you decide to repair the hernia, it is best to do it soon. When it becomes an emergency procedure, there is increased risk of complications after surgery. CAUSES   Heavy lifting.   Obesity.   Prolonged coughing.   Straining to move your bowels.   Hernias can also occur through a cut (incision) by a surgeonafter an abdominal operation.  HOME CARE INSTRUCTIONS Before the repair:  Bed rest is not required. You may continue your normal activities, but avoid heavy lifting (more than 10 pounds) or straining. Cough gently. If you are a smoker, it is best to stop. Even the best hernia repair can break down with the continual strain of coughing.   Do not wear anything tight over your hernia. Do not try to keep it in with an outside bandage or truss.  These can damage abdominal contents if they are trapped in the hernia sac.   Eat a normal diet. Avoid constipation. Straining over long periods of time to have a bowel movement will increase hernia size. It also can breakdown repairs. If you cannot do this with diet alone, laxatives or stool softeners may be used.  PRIOR TO SURGERY, SEEK IMMEDIATE MEDICAL CARE IF: You have problems (symptoms) of a trapped (incarcerated) hernia. Symptoms include:  An oral temperature above 102 F (38.9 C)  develops, or as your caregiver suggests.   Increasing abdominal pain.   Feeling sick to your stomach(nausea) and vomiting.   You stop passing gas or stool.   The hernia is stuck outside the abdomen, looks discolored, feels hard, or is tender.   You have any changes in your bowel habits or in the hernia that is unusual for you.  LET YOUR CAREGIVERS KNOW ABOUT THE FOLLOWING:  Allergies.   Medications taken including herbs, eye drops, over the counter medications, and creams.   Use of steroids (by mouth or creams).   Family or personal history of problems with anesthetics or Novocaine.   Possibility of pregnancy, if this applies.   Personal history of blood clots (thrombophlebitis).   Family or personal history of bleeding or blood problems.   Previous surgery.   Other health problems.  BEFORE THE PROCEDURE You should be present 1 hour prior to your procedure, or as directed by your caregiver.  AFTER THE PROCEDURE After surgery, you will be taken to the recovery area. A nurse will watch and check your progress there. Once you are awake, stable, and taking fluids well, you will be allowed to go home as long as there are no problems. Once home, an ice pack (wrapped in a light towel) applied to your operative site may help with discomfort. It may also keep the swelling down. Do not lift anything heavier than 10 pounds (4.55 kilograms). Take showers not baths. Do not drive while taking narcotics. Follow instructions as suggested by your caregiver.  SEEK IMMEDIATE MEDICAL CARE IF: After surgery:  There is redness, swelling, or increasing pain in the wound.   There is pus coming from the wound.   There is drainage from a wound lasting longer than 1 day.   An unexplained oral temperature above 102 F (38.9 C) develops.   You notice a foul smell coming from the wound or dressing.   There is a breaking open of a wound (edged not staying together) after the sutures have been  removed.   You notice increasing pain in the shoulders (shoulder strap areas).   You develop dizzy episodes or fainting while standing.   You develop persistent nausea or vomiting.   You develop a rash.   You have difficulty breathing.   You develop any reaction or side effects to medications given.  MAKE SURE YOU:   Understand these instructions.   Will watch your condition.   Will get help right away if you are not doing well or get worse.  Document Released: 03/25/2001 Document Revised: 09/18/2011 Document Reviewed: 02/15/2008 [facility] Patient Information 2012 [place], [place].Hernia, Surgical Repair A hernia occurs when an internal organ pushes out through a weak spot in the belly (abdominal) wall muscles. Hernias commonly occur in the groin and around the navel. Hernias often can be pushed back into place (reduced). Most hernias tend to get worse over time. Problems occur when abdominal contents get stuck in the opening (incarcerated hernia). The blood supply  gets cut off (strangulated hernia). This is an emergency and needs surgery. Otherwise, hernia repair can be an elective procedure. This means you can schedule this at your convenience when an emergency is not present. Because complications can occur, if you decide to repair the hernia, it is best to do it soon. When it becomes an emergency procedure, there is increased risk of complications after surgery. CAUSES   Heavy lifting.   Obesity.   Prolonged coughing.   Straining to move your bowels.   Hernias can also occur through a cut (incision) by a surgeonafter an abdominal operation.  HOME CARE INSTRUCTIONS Before the repair:  Bed rest is not required. You may continue your normal activities, but avoid heavy lifting (more than 10 pounds) or straining. Cough gently. If you are a smoker, it is best to stop. Even the best hernia repair can break down with the continual strain of coughing.   Do not wear anything tight over  your hernia. Do not try to keep it in with an outside bandage or truss. These can damage abdominal contents if they are trapped in the hernia sac.   Eat a normal diet. Avoid constipation. Straining over long periods of time to have a bowel movement will increase hernia size. It also can breakdown repairs. If you cannot do this with diet alone, laxatives or stool softeners may be used.  PRIOR TO SURGERY, SEEK IMMEDIATE MEDICAL CARE IF: You have problems (symptoms) of a trapped (incarcerated) hernia. Symptoms include:  An oral temperature above 102 F (38.9 C) develops, or as your caregiver suggests.   Increasing abdominal pain.   Feeling sick to your stomach(nausea) and vomiting.   You stop passing gas or stool.   The hernia is stuck outside the abdomen, looks discolored, feels hard, or is tender.   You have any changes in your bowel habits or in the hernia that is unusual for you.  LET YOUR CAREGIVERS KNOW ABOUT THE FOLLOWING:  Allergies.   Medications taken including herbs, eye drops, over the counter medications, and creams.   Use of steroids (by mouth or creams).   Family or personal history of problems with anesthetics or Novocaine.   Possibility of pregnancy, if this applies.   Personal history of blood clots (thrombophlebitis).   Family or personal history of bleeding or blood problems.   Previous surgery.   Other health problems.  BEFORE THE PROCEDURE You should be present 1 hour prior to your procedure, or as directed by your caregiver.  AFTER THE PROCEDURE After surgery, you will be taken to the recovery area. A nurse will watch and check your progress there. Once you are awake, stable, and taking fluids well, you will be allowed to go home as long as there are no problems. Once home, an ice pack (wrapped in a light towel) applied to your operative site may help with discomfort. It may also keep the swelling down. Do not lift anything heavier than 10 pounds (4.55  kilograms). Take showers not baths. Do not drive while taking narcotics. Follow instructions as suggested by your caregiver.  SEEK IMMEDIATE MEDICAL CARE IF: After surgery:  There is redness, swelling, or increasing pain in the wound.   There is pus coming from the wound.   There is drainage from a wound lasting longer than 1 day.   An unexplained oral temperature above 102 F (38.9 C) develops.   You notice a foul smell coming from the wound or dressing.   There is  a breaking open of a wound (edged not staying together) after the sutures have been removed.   You notice increasing pain in the shoulders (shoulder strap areas).   You develop dizzy episodes or fainting while standing.   You develop persistent nausea or vomiting.   You develop a rash.   You have difficulty breathing.   You develop any reaction or side effects to medications given.  MAKE SURE YOU:   Understand these instructions.   Will watch your condition.   Will get help right away if you are not doing well or get worse.  Document Released: 03/25/2001 Document Revised: 09/18/2011 Document Reviewed: 02/15/2008 [employer] Patient Information 2012 [employer] Post Anesthesia Home Care Instructions  Activity: Get plenty of rest for the remainder of the day. A responsible adult should stay with you for 24 hours following the procedure.  For the next 24 hours, DO NOT: -Drive a car -Advertising copywriter -Drink alcoholic beverages -Take any medication unless instructed by your physician -Make any legal decisions or sign important papers.  Meals: Start with liquid foods such as gelatin or soup. Progress to regular foods as tolerated. Avoid greasy, spicy, heavy foods. If nausea and/or vomiting occur, drink only clear liquids until the nausea and/or vomiting subsides. Call your physician if vomiting continues.  Special Instructions/Symptoms: Your throat may feel dry or sore from the anesthesia or the breathing  tube placed in your throat during surgery. If this causes discomfort, gargle with warm salt water. The discomfort should disappear within 24 hours.   Post Anesthesia Home Care Instructions  Activity: Get plenty of rest for the remainder of the day. A responsible adult should stay with you for 24 hours following the procedure.  For the next 24 hours, DO NOT: -Drive a car -Advertising copywriter -Drink alcoholic beverages -Take any medication unless instructed by your physician -Make any legal decisions or sign important papers.  Meals: Start with liquid foods such as gelatin or soup. Progress to regular foods as tolerated. Avoid greasy, spicy, heavy foods. If nausea and/or vomiting occur, drink only clear liquids until the nausea and/or vomiting subsides. Call your physician if vomiting continues.  Special Instructions/Symptoms: Your throat may feel dry or sore from the anesthesia or the breathing tube placed in your throat during surgery. If this causes discomfort, gargle with warm salt water. The discomfort should disappear within 24 hours.    Call your surgeon if you experience:   1.  Fever over 101.0. 2.  Inability to urinate. 3.  Nausea and/or vomiting. 4.  Extreme swelling or bruising at the surgical site. 5.  Continued bleeding from the incision. 6.  Increased pain, redness or drainage from the incision. 7.  Problems related to your pain medication. LC.

## 2012-03-05 NOTE — H&P (View-Only) (Signed)
Patient ID: Theodore Frank, male   DOB: [birth year], 76 y.o.   MRN: [id number]  Chief Complaint  Patient presents with  . Inguinal Hernia    HPI Theodore Frank is a 76 y.o. male.   HPIA little over a year ago patient developed some pain in his right groin. He was evaluated and found to have a small right inguinal hernia. He saw a physician regarding repair at that time. It was felt to be small. The surgery was scheduled. More recently, he developed an upper respiratory infection which involve a lot of coughing. The hernia became larger. He was seen by Dr. Cleta Alberts and referred for consultation regarding right inguinal hernia.Localized pain but no change in bowel or bladder habits.  Past Medical History  Diagnosis Date  . Cancer     colon  . Diabetes mellitus   . Hyperlipidemia     No past surgical history on file.  No family history on file.  Social History History  Substance Use Topics  . Smoking status: Former Smoker    Quit date: 10/13/1965  . Smokeless tobacco: Not on file  . Alcohol Use: No    No Known Allergies  Current Outpatient Prescriptions  Medication Sig Dispense Refill  . amLODipine (NORVASC) 10 MG tablet Take 1 tablet (10 mg total) by mouth daily.  90 tablet  3  . atorvastatin (LIPITOR) 10 MG tablet Take 1 tablet (10 mg total) by mouth daily.  90 tablet  3  . lisinopril-hydrochlorothiazide (PRINZIDE,ZESTORETIC) 20-12.5 MG per tablet Take 1 tablet by mouth daily.  90 tablet  3  . metFORMIN (GLUCOPHAGE) 500 MG tablet Take 1 tablet (500 mg total) by mouth 2 (two) times daily with a meal.  180 tablet  3  . methimazole (TAPAZOLE) 10 MG tablet Take 1 tablet (10 mg total) by mouth 1 day or 1 dose.  90 tablet  3    Review of Systems Review of Systems  Constitutional: Negative for fever, chills and unexpected weight change.  HENT: Negative for hearing loss, congestion, sore throat, trouble swallowing and voice change.   Eyes: Negative for visual disturbance.  Respiratory:  Negative for cough and wheezing.   Cardiovascular: Negative for chest pain, palpitations and leg swelling.  Gastrointestinal: Negative for nausea, vomiting, abdominal pain, diarrhea, constipation, blood in stool, abdominal distention, anal bleeding and rectal pain.  Genitourinary: Negative for hematuria and difficulty urinating.       Erectile dysfunction, right inguinal hernia  Musculoskeletal: Negative for arthralgias.  Skin: Negative for rash and wound.  Neurological: Negative for seizures, syncope, weakness and headaches.  Hematological: Negative for adenopathy. Does not bruise/bleed easily.  Psychiatric/Behavioral: Negative for confusion.    Blood pressure 110/78, pulse 72, temperature 97.7 F (36.5 C), temperature source Temporal, resp. rate 18, height 6\' 2"  (1.88 m), weight 180 lb 8 oz (81.874 kg).  Physical Exam Physical Exam  Constitutional: He is oriented to person, place, and time. He appears well-developed and well-nourished. No distress.  HENT:  Head: Normocephalic and atraumatic.  Eyes: EOM are normal. Pupils are equal, round, and reactive to light. No scleral icterus.  Neck: Normal range of motion. Neck supple. No tracheal deviation present.  Cardiovascular: Normal rate, regular rhythm and normal heart sounds.   No murmur heard. Pulmonary/Chest: Effort normal and breath sounds normal. No stridor. No respiratory distress. He has no wheezes. He has no rales.  Abdominal: Soft. Bowel sounds are normal. He exhibits no distension. There is no tenderness. There is no rebound and  no guarding.       Inguinal exam reveals small right inguinal hernia which reduces easily but recurs spontaneously, left inguinal exam reveals no hernia, Testes are both descended and nonedematous  Genitourinary: Penis normal.  Musculoskeletal: Normal range of motion. He exhibits no edema.  Neurological: He is alert and oriented to person, place, and time. Coordination normal.  Skin: Skin is warm and  dry.    Data Reviewed Office notes  Assessment    Symptomatic right inguinal hernia    Plan    I have offered right inguinal hernia repair with mesh. Procedure, risks, benefits were discussed in detail with the patient. He was given literature on hernias and the procedure for repair. He is aware he will need to hold off on heavy lifting for 6 weeks after surgery. I answered his questions. He is agreeable.       Koichi Platte E 02/25/2012, 9:48 AM

## 2012-03-05 NOTE — Op Note (Signed)
03/05/2012  10:10 AM  PATIENT:  Theodore Frank  76 y.o. male  PRE-OPERATIVE DIAGNOSIS:  Right inguinal hernia  POST-OPERATIVE DIAGNOSIS:  Right inguinal hernia  PROCEDURE:  Procedure(s): HERNIA REPAIR INGUINAL ADULT INSERTION OF MESH  SURGEON:  Surgeon(s): Liz Malady, MD  PHYSICIAN ASSISTANT:   ASSISTANTS: none   ANESTHESIA:   general and TAP  EBL:  Total I/O In: 1000 [I.V.:1000] Out: -   BLOOD ADMINISTERED:none  DRAINS: none   SPECIMEN:  No Specimen  DISPOSITION OF SPECIMEN:  N/A  COUNTS:  YES  DICTATION: .Dragon Dictation patient presents for repair of right inguinal hernia. He was identified in the preop holding area. His site was marked. He received a TAP block by anesthesia. He received intravenous antibiotics. He is brought to the operating room and general endotracheal anesthesia was a Optician, dispensing by the anesthesia staff. Right groin was prepped and draped in sterile fashion along with the left groin lower abdomen. Local anesthetic was injected along the planned line of incision. Right groin incision was made subcutaneous tissues were then dissected down through Scarpa's fascia. External oblique fascia was identified. This was divided out laterally. The division was continued down through the external ring. The superior leaflet was dissected free off the transversalis and the inferior leaflet was dissected down revealing the shelving edge of inguinal ligament. Cord structures were encircled with a Penrose drain. Hernia was revealed to be a direct hernia which was moderately large. The cord was inspected and there was no indirect component. The direct hernia was held with used with a figure-of-eight 2-0 Vicryl suture from the shelving edge of inguinal ligament up to the transversalis. This allowed formal repair with a keyhole polypropylene mesh. This was cut to custom-shaped in size. It was tacked to the tissues over the pubic tubercle medially with 0 Prolene. It was  tacked a running fashion to the shelving edge of inguinal ligament inferiorly. Superiorly the mesh was tacked to the tissues over the pubic tubercle with 0 Prolene interrupted. Further interrupted Prolenes were used to tack the mesh along the transversalis. The 2 leaflets of the mesh were rejoined behind the cord and tacked together and down to the underlying musculature with 0 Prolene. The leaflets were left long and tucked out laterally beneath the external fascia. Meticulous hemostasis was obtained. Area was irrigated. The aperture in the mesh was adjusted to admit the tip of the fifth digit next to the cord structures. Cord structures remained viable and nonedematous. External oblique fascia was closed with running 2-0 Vicryl. Scarpa's fascia was closed with interrupted 3-0 Vicryl. Skin was irrigated and closed with running 4-0 Monocryl followed by Dermabond. Sponge needle and instrument counts were all correct. Right testicle was returned to anatomic position in the scrotum at the completion of the procedure. There were no complications and the patient was taken to recovery in stable condition.  PATIENT DISPOSITION:  PACU - hemodynamically stable.   Delay start of Pharmacological VTE agent (>24hrs) due to surgical blood loss or risk of bleeding:  no  Violeta Gelinas, MD, MPH, FACS Pager: [phone]  5/24/201310:10 AM

## 2012-03-05 NOTE — Anesthesia Preprocedure Evaluation (Signed)
Anesthesia Evaluation  Patient identified by MRN, date of birth, ID band Patient awake    Reviewed: Allergy & Precautions, H&P , NPO status , Patient's Chart, lab work & pertinent test results, reviewed documented beta blocker date and time   Airway Mallampati: II TM Distance: >3 FB Neck ROM: full    Dental   Pulmonary neg pulmonary ROS,          Cardiovascular hypertension, On Medications     Neuro/Psych negative neurological ROS  negative psych ROS   GI/Hepatic negative GI ROS, Neg liver ROS,   Endo/Other  Diabetes mellitus-, Oral Hypoglycemic AgentsHyperthyroidism   Renal/GU negative Renal ROS  negative genitourinary   Musculoskeletal   Abdominal   Peds  Hematology negative hematology ROS (+)   Anesthesia Other Findings See surgeon's H&P   Reproductive/Obstetrics negative OB ROS                           Anesthesia Physical Anesthesia Plan  ASA: II  Anesthesia Plan: General   Post-op Pain Management:    Induction: Intravenous  Airway Management Planned: LMA  Additional Equipment:   Intra-op Plan:   Post-operative Plan: Extubation in OR  Informed Consent: I have reviewed the patients History and Physical, chart, labs and discussed the procedure including the risks, benefits and alternatives for the proposed anesthesia with the patient or authorized representative who has indicated his/her understanding and acceptance.   Dental Advisory Given  Plan Discussed with: CRNA and Surgeon  Anesthesia Plan Comments:         Anesthesia Quick Evaluation

## 2012-03-05 NOTE — Interval H&P Note (Signed)
History and Physical Interval Note:  03/05/2012 8:49 AM  Theodore Frank  has presented today for surgery, with the diagnosis of right inguinal hernia  The various methods of treatment have been discussed with the patient and family. After consideration of risks, benefits and other options for treatment, the patient has consented to  Procedure(s) (LRB): HERNIA REPAIR INGUINAL ADULT (Right) INSERTION OF MESH (Right) as a surgical intervention .  The patients' history has been reviewed, patient re-examined and his site was marked, no change in status, stable for surgery.  I have reviewed the patients' chart and labs.  Questions were answered to the patient's satisfaction.     Emogene Muratalla E

## 2012-03-05 NOTE — Anesthesia Postprocedure Evaluation (Signed)
Anesthesia Post Note  Patient: Theodore Frank  Procedure(s) Performed: Procedure(s) (LRB): HERNIA REPAIR INGUINAL ADULT (Right) INSERTION OF MESH (Right)  Anesthesia type: General  Patient location: PACU  Post pain: Pain level controlled  Post assessment: Patient's Cardiovascular Status Stable  Last Vitals:  Filed Vitals:   03/05/12 1100  BP: 148/79  Pulse: 51  Temp:   Resp: 15    Post vital signs: Reviewed and stable  Level of consciousness: alert  Complications: No apparent anesthesia complications

## 2012-03-05 NOTE — Progress Notes (Signed)
Denture/glasses returned

## 2012-03-05 NOTE — Progress Notes (Signed)
Assisted Dr. Frederick with right, ultrasound guided, transabdominal plane block. Side rails up, monitors on throughout procedure. See vital signs in flow sheet. Tolerated Procedure well. 

## 2012-03-05 NOTE — Anesthesia Procedure Notes (Addendum)
Anesthesia Regional Block:  TAP block  Pre-Anesthetic Checklist: ,, timeout performed, Correct Patient, Correct Site, Correct Laterality, Correct Procedure, Correct Position, site marked, Risks and benefits discussed,  Surgical consent,  Pre-op evaluation,  At surgeon's request and post-op pain management  Laterality: Right  Prep: chloraprep       Needles:  Injection technique: Single-shot  Needle Type: Echogenic Needle     Needle Length: 9cm  Needle Gauge: 21    Additional Needles:  Procedures: ultrasound guided TAP block Narrative:  Start time: 03/05/2012 8:51 AM End time: 03/05/2012 8:58 AM Injection made incrementally with aspirations every 5 mL.  Performed by: Personally  Anesthesiologist: Aldona Lento, MD  Additional Notes: Ultrasound guidance used to: id relevant anatomy, confirm needle position, local anesthetic spread, avoidance of vascular puncture. Picture saved. No complications. Block performed personally by Janetta Hora. Gelene Mink, MD    TAP block Procedure Name: LMA Insertion Date/Time: 03/05/2012 9:20 AM Performed by: Gar Gibbon Pre-anesthesia Checklist: Patient identified, Emergency Drugs available, Suction available and Patient being monitored Patient Re-evaluated:Patient Re-evaluated prior to inductionOxygen Delivery Method: Circle System Utilized Preoxygenation: Pre-oxygenation with 100% oxygen Intubation Type: IV induction Ventilation: Mask ventilation without difficulty LMA: LMA inserted LMA Size: 5.0 Number of attempts: 1 Airway Equipment and Method: bite block Placement Confirmation: positive ETCO2 Tube secured with: Tape Dental Injury: Teeth and Oropharynx as per pre-operative assessment

## 2012-03-09 ENCOUNTER — Encounter (HOSPITAL_BASED_OUTPATIENT_CLINIC_OR_DEPARTMENT_OTHER): Payer: Self-pay | Admitting: General Surgery

## 2012-04-02 ENCOUNTER — Ambulatory Visit (INDEPENDENT_AMBULATORY_CARE_PROVIDER_SITE_OTHER): Payer: Medicare Other | Admitting: General Surgery

## 2012-04-02 ENCOUNTER — Encounter (INDEPENDENT_AMBULATORY_CARE_PROVIDER_SITE_OTHER): Payer: Self-pay | Admitting: General Surgery

## 2012-04-02 VITALS — BP 120/72 | HR 72 | Temp 97.8°F | Resp 16 | Ht 74.0 in | Wt 182.1 lb

## 2012-04-02 DIAGNOSIS — Z9889 Other specified postprocedural states: Secondary | ICD-10-CM

## 2012-04-02 DIAGNOSIS — Z8719 Personal history of other diseases of the digestive system: Secondary | ICD-10-CM | POA: Insufficient documentation

## 2012-04-02 NOTE — Progress Notes (Signed)
Subjective:     Patient ID: Theodore Frank, male   DOB: 01-30-1936, 76 y.o.   MRN: [id number]  HPI The patient presents for followup of right inguinal hernia repair with mesh. He is doing well.patient never required PO pain medication.  Review of Systems     Objective:   Physical Exam Abdomen is soft and flat, right inguinal incision is well-healed. There is mild resolving edema along the incision and in the right scrotum. The hernia repair is intact.    Assessment:     Doing very well status post repair of right inguinal hernia with mesh    Plan:     Avoid heavy lifting for a total of 6 weeks after surgery. Return p.r.n.

## 2012-08-13 ENCOUNTER — Ambulatory Visit (INDEPENDENT_AMBULATORY_CARE_PROVIDER_SITE_OTHER): Payer: Medicare Other | Admitting: Emergency Medicine

## 2012-08-13 VITALS — BP 123/76 | HR 67 | Temp 98.2°F | Resp 17 | Ht 72.5 in | Wt 185.0 lb

## 2012-08-13 DIAGNOSIS — T753XXA Motion sickness, initial encounter: Secondary | ICD-10-CM

## 2012-08-13 MED ORDER — MECLIZINE HCL 50 MG PO TABS: 50.0000 mg | ORAL_TABLET | Freq: Three times a day (TID) | ORAL | Status: DC | PRN

## 2012-08-13 NOTE — Progress Notes (Signed)
[facility] [address] [place] [address] [phone]  Date:  08/13/2012   Name:  Theodore Frank   DOB:  05/26/1936   MRN:  [id number]  PCP:  Milana Obey, MD    Chief Complaint: Headache   History of Present Illness:  Theodore Frank is a 76 y.o. 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  And experienced motion sickness and had nausea and a headache and a sensation of vertigo.  Over the week, the vertigo and nausea have largely passed but he still has a low level headache in the top of his head.  Denies any head injury even minor and remote and no history of antecedent illness, fever or chills.  No cough or stool change, nausea or vomiting. No neuro of visual symptoms.  Sugar is running 136 this morning.  Patient Active Problem List  Diagnosis  . Hypertension  . Hyperthyroidism  . Diabetes mellitus  . Hyperlipidemia  . Right inguinal hernia  . S/P right inguinal hernia repair    Past Medical History  Diagnosis Date  . Diabetes mellitus   . Hyperlipidemia   . Hypertension   . Cancer     colon  . Prostate cancer     radiation    Past Surgical History  Procedure Date  . Colon surgery 2001    colon resection  . Inguinal hernia repair 03/05/2012    Procedure: HERNIA REPAIR INGUINAL ADULT;  Surgeon: Liz Malady, MD;  Location: [facility];  Service: General;  Laterality: Right;  Repair right inguinal hernia with mesh    History  Substance Use Topics  . Smoking status: Former Smoker    Quit date: 10/13/1965  . Smokeless tobacco: Not on file  . Alcohol Use: No    History reviewed. No pertinent family history.  No Known Allergies  Medication list has been reviewed and updated.  Current Outpatient Prescriptions on File Prior to Visit  Medication Sig Dispense Refill  . amLODipine (NORVASC) 10 MG tablet Take 1  tablet (10 mg total) by mouth daily.  90 tablet  3  . atorvastatin (LIPITOR) 10 MG tablet Take 1 tablet (10 mg total) by mouth daily.  90 tablet  3  . lisinopril-hydrochlorothiazide (PRINZIDE,ZESTORETIC) 20-12.5 MG per tablet Take 1 tablet by mouth daily.  90 tablet  3  . metFORMIN (GLUCOPHAGE) 500 MG tablet Take 1 tablet (500 mg total) by mouth 2 (two) times daily with a meal.  180 tablet  3  . methimazole (TAPAZOLE) 10 MG tablet Take 1 tablet (10 mg total) by mouth 1 day or 1 dose.  90 tablet  3    Review of Systems:  As per HPI, otherwise negative.    Physical Examination: Filed Vitals:   08/13/12 1112  BP: 123/76  Pulse: 67  Temp: 98.2 F (36.8 C)  Resp: 17   Filed Vitals:   08/13/12 1112  Height: 6' 0.5" (1.842 m)  Weight: 185 lb (83.915 kg)   Body mass index is 24.75 kg/(m^2). Ideal Body Weight: Weight in (lb) to have BMI = 25: 186.5   GEN: WDWN, NAD, Non-toxic, A & O x 3 HEENT: Atraumatic, Normocephalic. Neck supple. No masses, No LAD.  PRRERLA EOMI  CN 2-12 intact.  Oropharynx negative Ears and Nose: No external deformity. TM negative CV: RRR, No M/G/R. No JVD.  No thrill. No extra heart sounds. PULM: CTA B, no wheezes, crackles, rhonchi. No retractions. No resp. distress. No accessory muscle use. ABD: S, NT, ND, +BS. No rebound. No HSM. EXTR: No c/c/e NEURO Normal gait balance or coordination.  PSYCH: Normally interactive. Conversant. Not depressed or anxious appearing.  Calm demeanor.    Assessment and Plan: Vertigo Motion sickness antivert Follow up as needed  Carmelina Dane, MD

## 2012-08-16 NOTE — Progress Notes (Signed)
Reviewed and agree.

## 2012-09-03 ENCOUNTER — Ambulatory Visit (INDEPENDENT_AMBULATORY_CARE_PROVIDER_SITE_OTHER): Payer: Medicare Other | Admitting: Emergency Medicine

## 2012-09-03 VITALS — BP 118/70 | HR 68 | Temp 97.3°F | Resp 16 | Ht 72.0 in | Wt 184.0 lb

## 2012-09-03 DIAGNOSIS — J018 Other acute sinusitis: Secondary | ICD-10-CM | POA: Diagnosis not present

## 2012-09-03 MED ORDER — PSEUDOEPHEDRINE-GUAIFENESIN ER 60-600 MG PO TB12: 1.0000 | ORAL_TABLET | Freq: Two times a day (BID) | ORAL | Status: DC

## 2012-09-03 MED ORDER — AMOXICILLIN-POT CLAVULANATE 875-125 MG PO TABS: 1.0000 | ORAL_TABLET | Freq: Two times a day (BID) | ORAL | Status: DC

## 2012-09-03 NOTE — Progress Notes (Signed)
[facility] [address], [place] [address] [phone]  Date:  09/03/2012   Name:  Theodore Frank   DOB:  [birth year]   MRN:  [id number]  PCP:  Milana Obey, MD    Chief Complaint: Cough   History of Present Illness:  Theodore Frank is a 76 y.o. very pleasant male patient who presents with the following:  One week of a "cold" with nasal congestion and post nasal drip.  Cough productive scant purulent sputum.  No fever or chills.  No wheezing or shortness of breath.  No nausea or vomiting.  Pressure in cheeks and forehead worse when bends over, sneezes or coughs.  Patient Active Problem List  Diagnosis  . Hypertension  . Hyperthyroidism  . Diabetes mellitus  . Hyperlipidemia  . Right inguinal hernia  . S/P right inguinal hernia repair    Past Medical History  Diagnosis Date  . Diabetes mellitus   . Hyperlipidemia   . Hypertension   . Cancer     colon  . Prostate cancer     radiation    Past Surgical History  Procedure Date  . Colon surgery 2001    colon resection  . Inguinal hernia repair 03/05/2012    Procedure: HERNIA REPAIR INGUINAL ADULT;  Surgeon: Liz Malady, MD;  Location: [facility];  Service: General;  Laterality: Right;  Repair right inguinal hernia with mesh    History  Substance Use Topics  . Smoking status: Former Smoker    Quit date: 10/13/1965  . Smokeless tobacco: Not on file  . Alcohol Use: No    No family history on file.  No Known Allergies  Medication list has been reviewed and updated.  Current Outpatient Prescriptions on File Prior to Visit  Medication Sig Dispense Refill  . amLODipine (NORVASC) 10 MG tablet Take 1 tablet (10 mg total) by mouth daily.  90 tablet  3  . atorvastatin (LIPITOR) 10 MG tablet Take 1 tablet (10 mg total) by mouth daily.  90 tablet  3  . lisinopril-hydrochlorothiazide (PRINZIDE,ZESTORETIC) 20-12.5 MG per tablet Take 1 tablet by mouth daily.  90 tablet  3  .  metFORMIN (GLUCOPHAGE) 500 MG tablet Take 1 tablet (500 mg total) by mouth 2 (two) times daily with a meal.  180 tablet  3  . methimazole (TAPAZOLE) 10 MG tablet Take 1 tablet (10 mg total) by mouth 1 day or 1 dose.  90 tablet  3  . meclizine (ANTIVERT) 50 MG tablet Take 1 tablet (50 mg total) by mouth 3 (three) times daily as needed.  30 tablet  0    Review of Systems:  As per HPI, otherwise negative.    Physical Examination: Filed Vitals:   09/03/12 1017  BP: 118/70  Pulse: 68  Temp: 97.3 F (36.3 C)  Resp: 16   Filed Vitals:   09/03/12 1017  Height: 6' (1.829 m)  Weight: 184 lb (83.462 kg)   Body mass index is 24.95 kg/(m^2). Ideal Body Weight: Weight in (lb) to have BMI = 25: 183.9   GEN: WDWN, NAD, Non-toxic, A & O x 3  No rash or sepsis or shortness of breath HEENT: Atraumatic, Normocephalic. Neck supple. No masses, No LAD.  Oropharynx negative Ears and Nose: No external deformity.  TM negative.  Tender over maxillary sinuses.  Green nasal drainage CV: RRR, No M/G/R. No JVD. No thrill. No extra heart sounds. PULM: CTA B, no wheezes, crackles, rhonchi. No retractions.  No resp. distress. No accessory muscle use. ABD: S, NT, ND, +BS. No rebound. No HSM. EXTR: No c/c/e NEURO Normal gait.  PSYCH: Normally interactive. Conversant. Not depressed or anxious appearing.  Calm demeanor.    Assessment and Plan: Sinusitis augmentin mucinex d Follow up as needed  Carmelina Dane, MD

## 2012-09-13 DIAGNOSIS — E119 Type 2 diabetes mellitus without complications: Secondary | ICD-10-CM | POA: Diagnosis not present

## 2012-09-13 DIAGNOSIS — H2510 Age-related nuclear cataract, unspecified eye: Secondary | ICD-10-CM | POA: Diagnosis not present

## 2013-03-25 ENCOUNTER — Ambulatory Visit (INDEPENDENT_AMBULATORY_CARE_PROVIDER_SITE_OTHER): Payer: Medicare Other | Admitting: Internal Medicine

## 2013-03-25 VITALS — BP 110/80 | HR 72 | Temp 97.9°F | Resp 18 | Wt 185.0 lb

## 2013-03-25 DIAGNOSIS — I10 Essential (primary) hypertension: Secondary | ICD-10-CM

## 2013-03-25 DIAGNOSIS — E785 Hyperlipidemia, unspecified: Secondary | ICD-10-CM

## 2013-03-25 DIAGNOSIS — E0590 Thyrotoxicosis, unspecified without thyrotoxic crisis or storm: Secondary | ICD-10-CM

## 2013-03-25 DIAGNOSIS — E119 Type 2 diabetes mellitus without complications: Secondary | ICD-10-CM

## 2013-03-25 DIAGNOSIS — Z79899 Other long term (current) drug therapy: Secondary | ICD-10-CM

## 2013-03-25 DIAGNOSIS — Z125 Encounter for screening for malignant neoplasm of prostate: Secondary | ICD-10-CM

## 2013-03-25 LAB — POCT CBC
Granulocyte percent: 66.1 %G (ref 37–80)
HCT, POC: 42.3 % — AB (ref 43.5–53.7)
Hemoglobin: 13.2 g/dL — AB (ref 14.1–18.1)
Lymph, poc: 1.4 (ref 0.6–3.4)
MCH, POC: 30.3 pg (ref 27–31.2)
MCHC: 31.2 g/dL — AB (ref 31.8–35.4)
MCV: 94.2 fL (ref 80–97)
MID (cbc): 0.3 (ref 0–0.9)
MPV: 8.4 fL (ref 0–99.8)
POC Granulocyte: 3.3 (ref 2–6.9)
POC LYMPH PERCENT: 28.6 %L (ref 10–50)
POC MID %: 5.3 %M (ref 0–12)
Platelet Count, POC: 180 10*3/uL (ref 142–424)
RBC: 4.35 M/uL — AB (ref 4.69–6.13)
RDW, POC: 13.3 %
WBC: 5 10*3/uL (ref 4.6–10.2)

## 2013-03-25 LAB — POCT GLYCOSYLATED HEMOGLOBIN (HGB A1C): Hemoglobin A1C: 6.5

## 2013-03-25 LAB — GLUCOSE, POCT (MANUAL RESULT ENTRY): POC Glucose: 129 mg/dl — AB (ref 70–99)

## 2013-03-25 LAB — POCT SEDIMENTATION RATE: POCT SED RATE: 14 mm/hr (ref 0–22)

## 2013-03-25 MED ORDER — METFORMIN HCL 500 MG PO TABS: 500.0000 mg | ORAL_TABLET | Freq: Two times a day (BID) | ORAL | Status: DC

## 2013-03-25 MED ORDER — ATORVASTATIN CALCIUM 10 MG PO TABS: 10.0000 mg | ORAL_TABLET | Freq: Every day | ORAL | Status: DC

## 2013-03-25 MED ORDER — LISINOPRIL-HYDROCHLOROTHIAZIDE 20-12.5 MG PO TABS: 1.0000 | ORAL_TABLET | Freq: Every day | ORAL | Status: DC

## 2013-03-25 MED ORDER — AMLODIPINE BESYLATE 10 MG PO TABS: 10.0000 mg | ORAL_TABLET | Freq: Every day | ORAL | Status: DC

## 2013-03-25 NOTE — Patient Instructions (Addendum)
Hyperthyroidism The thyroid is a large gland located in the lower front part of your neck. The thyroid helps control metabolism. Metabolism is how your body uses food. It controls metabolism with the hormone thyroxine. When the thyroid is overactive, it produces too much hormone. When this happens, these following problems may occur:   Nervousness  Heat intolerance  Weight loss (in spite of increase food intake)  Diarrhea  Change in hair or skin texture  Palpitations (heart skipping or having extra beats)  Tachycardia (rapid heart rate)  Loss of menstruation (amenorrhea)  Shaking of the hands CAUSES  Grave's Disease (the immune system attacks the thyroid gland). This is the most common cause.  Inflammation of the thyroid gland.  Tumor (usually benign) in the thyroid gland or elsewhere.  Excessive use of thyroid medications (both prescription and 'natural').  Excessive ingestion of Iodine. DIAGNOSIS  To prove hyperthyroidism, your caregiver may do blood tests and ultrasound tests. Sometimes the signs are hidden. It may be necessary for your caregiver to watch this illness with blood tests, either before or after diagnosis and treatment. TREATMENT Short-term treatment There are several treatments to control symptoms. Drugs called beta blockers may give some relief. Drugs that decrease hormone production will provide temporary relief in many people. These measures will usually not give permanent relief. Definitive therapy There are treatments available which can be discussed between you and your caregiver which will permanently treat the problem. These treatments range from surgery (removal of the thyroid), to the use of radioactive iodine (destroys the thyroid by radiation), to the use of antithyroid drugs (interfere with hormone synthesis). The first two treatments are permanent and usually successful. They most often require hormone replacement therapy for life. This is because  it is impossible to remove or destroy the exact amount of thyroid required to make a person euthyroid (normal). HOME CARE INSTRUCTIONS  See your caregiver if the problems you are being treated for get worse. Examples of this would be the problems listed above. SEEK MEDICAL CARE IF: Your general condition worsens. MAKE SURE YOU:   Understand these instructions.  Will watch your condition.  Will get help right away if you are not doing well or get worse. Document Released: 09/29/2005 Document Revised: 12/22/2011 Document Reviewed: 02/10/2007 [facility] Patient Information 2014 [place], [place]. Hypertension As your heart beats, it forces blood through your arteries. This force is your blood pressure. If the pressure is too high, it is called hypertension (HTN) or high blood pressure. HTN is dangerous because you may have it and not know it. High blood pressure may mean that your heart has to work harder to pump blood. Your arteries may be narrow or stiff. The extra work puts you at risk for heart disease, stroke, and other problems.  Blood pressure consists of two numbers, a higher number over a lower, 110/72, for example. It is stated as "110 over 72." The ideal is below 120 for the top number (systolic) and under 80 for the bottom (diastolic). Write down your blood pressure today. You should pay close attention to your blood pressure if you have certain conditions such as:  Heart failure.  Prior heart attack.  Diabetes  Chronic kidney disease.  Prior stroke.  Multiple risk factors for heart disease. To see if you have HTN, your blood pressure should be measured while you are seated with your arm held at the level of the heart. It should be measured at least twice. A one-time elevated blood pressure reading (especially  in the Emergency Department) does not mean that you need treatment. There may be conditions in which the blood pressure is different between your right and left arms. It is  important to see your caregiver soon for a recheck. Most people have essential hypertension which means that there is not a specific cause. This type of high blood pressure may be lowered by changing lifestyle factors such as:  Stress.  Smoking.  Lack of exercise.  Excessive weight.  Drug/tobacco/alcohol use.  Eating less salt. Most people do not have symptoms from high blood pressure until it has caused damage to the body. Effective treatment can often prevent, delay or reduce that damage. TREATMENT  When a cause has been identified, treatment for high blood pressure is directed at the cause. There are a large number of medications to treat HTN. These fall into several categories, and your caregiver will help you select the medicines that are best for you. Medications may have side effects. You should review side effects with your caregiver. If your blood pressure stays high after you have made lifestyle changes or started on medicines,   Your medication(s) may need to be changed.  Other problems may need to be addressed.  Be certain you understand your prescriptions, and know how and when to take your medicine.  Be sure to follow up with your caregiver within the time frame advised (usually within two weeks) to have your blood pressure rechecked and to review your medications.  If you are taking more than one medicine to lower your blood pressure, make sure you know how and at what times they should be taken. Taking two medicines at the same time can result in blood pressure that is too low. SEEK IMMEDIATE MEDICAL CARE IF:  You develop a severe headache, blurred or changing vision, or confusion.  You have unusual weakness or numbness, or a faint feeling.  You have severe chest or abdominal pain, vomiting, or breathing problems. MAKE SURE YOU:   Understand these instructions.  Will watch your condition.  Will get help right away if you are not doing well or get  worse. Document Released: 09/29/2005 Document Revised: 12/22/2011 Document Reviewed: 05/19/2008 [facility] Patient Information 2014 [place], [place].

## 2013-03-25 NOTE — Progress Notes (Signed)
  Subjective:    Patient ID: Theodore Frank, male    DOB: December 08, 1935, 77 y.o.   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 Tolerates all his meds. He has NIDDM, HTN, High cholesterol, and hyperthyroid hx on tapazole but he has no memory of thyroid disorder. He also does not know why he is on pantoprazole because no hx of gerd. On chart review last labs done in er showed high calcium and lfts and anemia. Had colectomy for colon cancer 2001, Dr. Loreta Ave did recent colonoscopy. Unclear about thyroid status, anemia, calcium, gerd  Review of Systems     Objective:   Physical Exam  Vitals reviewed. Constitutional: He is oriented to person, place, and time. He appears well-developed and well-nourished.  HENT:  Right Ear: External ear normal.  Left Ear: External ear normal.  Neck: Normal range of motion. Neck supple. No thyromegaly present.  Cardiovascular: Normal rate, regular rhythm and normal heart sounds.   Pulmonary/Chest: Effort normal and breath sounds normal.  Abdominal: Soft. Bowel sounds are normal. There is no tenderness.  Musculoskeletal: Normal range of motion.  Lymphadenopathy:    He has no cervical adenopathy.  Neurological: He is alert and oriented to person, place, and time. Coordination normal.  Psychiatric: He has a normal mood and affect.   BP 102/78 Results for orders placed in visit on 03/25/13  POCT CBC      Result Value Range   WBC 5.0  4.6 - 10.2 K/uL   Lymph, poc 1.4  0.6 - 3.4   POC LYMPH PERCENT 28.6  10 - 50 %L   MID (cbc) 0.3  0 - 0.9   POC MID % 5.3  0 - 12 %M   POC Granulocyte 3.3  2 - 6.9   Granulocyte percent 66.1  37 - 80 %G   RBC 4.35 (*) 4.69 - 6.13 M/uL   Hemoglobin 13.2 (*) 14.1 - 18.1 g/dL   HCT, POC 65.7 (*) 84.6 - 53.7 %   MCV 94.2  80 - 97 fL   MCH, POC 30.3  27 - 31.2 pg   MCHC 31.2 (*) 31.8 - 35.4 g/dL   RDW, POC 96.2     Platelet Count, POC 180  142 - 424 K/uL   MPV 8.4  0 - 99.8 fL   GLUCOSE, POCT (MANUAL RESULT ENTRY)      Result Value Range   POC Glucose 129 (*) 70 - 99 mg/dl  POCT GLYCOSYLATED HEMOGLOBIN (HGB A1C)      Result Value Range   Hemoglobin A1C 6.5           Assessment & Plan:  HTN bp too low will reduce meds/ Hold amlodipine and monitor NIDDM controlled ? Thyroid disorder labs pending Refill meds

## 2013-03-26 LAB — COMPREHENSIVE METABOLIC PANEL
ALT: 12 U/L (ref 0–53)
AST: 14 U/L (ref 0–37)
Albumin: 4.3 g/dL (ref 3.5–5.2)
Alkaline Phosphatase: 58 U/L (ref 39–117)
BUN: 19 mg/dL (ref 6–23)
CO2: 28 mEq/L (ref 19–32)
Calcium: 10.6 mg/dL — ABNORMAL HIGH (ref 8.4–10.5)
Chloride: 105 mEq/L (ref 96–112)
Creat: 1.23 mg/dL (ref 0.50–1.35)
Glucose, Bld: 138 mg/dL — ABNORMAL HIGH (ref 70–99)
Potassium: 4.2 mEq/L (ref 3.5–5.3)
Sodium: 140 mEq/L (ref 135–145)
Total Bilirubin: 0.9 mg/dL (ref 0.3–1.2)
Total Protein: 7.3 g/dL (ref 6.0–8.3)

## 2013-03-26 LAB — LIPID PANEL
Cholesterol: 154 mg/dL (ref 0–200)
HDL: 46 mg/dL (ref >39)
LDL Cholesterol: 87 mg/dL (ref 0–99)
Total CHOL/HDL Ratio: 3.3 Ratio
Triglycerides: 104 mg/dL (ref <150)
VLDL: 21 mg/dL (ref 0–40)

## 2013-03-26 LAB — T4, FREE: Free T4: 0.94 ng/dL (ref 0.80–1.80)

## 2013-03-26 LAB — T3, FREE: T3, Free: 2.6 pg/mL (ref 2.3–4.2)

## 2013-03-26 LAB — TSH: TSH: 5.939 u[IU]/mL — ABNORMAL HIGH (ref 0.350–4.500)

## 2013-03-26 LAB — PSA, MEDICARE: PSA: 1.67 ng/mL (ref <=4.00)

## 2013-04-05 ENCOUNTER — Ambulatory Visit (INDEPENDENT_AMBULATORY_CARE_PROVIDER_SITE_OTHER): Payer: Medicare Other | Admitting: Internal Medicine

## 2013-04-05 VITALS — BP 116/74 | HR 76 | Temp 98.0°F | Resp 16 | Ht 72.5 in | Wt 184.0 lb

## 2013-04-05 DIAGNOSIS — I10 Essential (primary) hypertension: Secondary | ICD-10-CM

## 2013-04-05 DIAGNOSIS — C61 Malignant neoplasm of prostate: Secondary | ICD-10-CM

## 2013-04-05 DIAGNOSIS — Z8546 Personal history of malignant neoplasm of prostate: Secondary | ICD-10-CM | POA: Insufficient documentation

## 2013-04-05 NOTE — Progress Notes (Signed)
  Subjective:    Patient ID: Theodore Frank, male    DOB: [birth year], 77 y.o.   MRN: [id number]  HPI  77 YO male patient here today follow up with Dr. Perrin Maltese in regards to his blood pressure dropping and lab work that was done last visit. He states stopped taking his thyroid medication. He is unsure if he stopped his Norvasc. Patient states he feels great and has noticed no difference. He states he has been very active with his church the past week. He has been a little fatigued only as a result of his schedule. He states he has been doing really good. He was drinking a lot of [employer] and a friend told him that it could raise his blood pressure. He switched his morning juice to Cranberry. He noticed his sugar was a little high this morning. It was 150 before breakfast. Home blood pressure was 135/72 before he took his medications.   Patient has a history of hernia repair 2013. Colon Cancer removed in 2001.  Calcium was high last visit at 10.6 and TSH was high at 5.9. We will continue to monitor these values. He does not remember if he took tapazole or amlodipine, he was supposed to hold amlodipine  Review of Systems     Objective:   Physical Exam  Vitals reviewed. Constitutional: He is oriented to person, place, and time. He appears well-developed and well-nourished. No distress.  Eyes: EOM are normal.  Cardiovascular: Normal rate, regular rhythm and normal heart sounds.   Pulmonary/Chest: Effort normal and breath sounds normal.  Neurological: He is alert and oriented to person, place, and time. He exhibits normal muscle tone. Coordination normal.  Psychiatric: He has a normal mood and affect.   BP 118/74       Assessment & Plan:  Hold amlodipine DC tapazole Reck bp Friday at 2pm

## 2013-04-05 NOTE — Progress Notes (Signed)
  Subjective:    Patient ID: Theodore Frank, male    DOB: November 25, 1935, 77 y.o.   MRN: [id number]  HPI    Review of Systems     Objective:   Physical Exam        Assessment & Plan:

## 2013-04-08 ENCOUNTER — Ambulatory Visit (INDEPENDENT_AMBULATORY_CARE_PROVIDER_SITE_OTHER): Payer: Medicare Other | Admitting: Internal Medicine

## 2013-04-08 VITALS — BP 130/82 | HR 79 | Temp 98.2°F | Resp 16 | Ht 73.5 in | Wt 186.4 lb

## 2013-04-08 DIAGNOSIS — E119 Type 2 diabetes mellitus without complications: Secondary | ICD-10-CM

## 2013-04-08 DIAGNOSIS — Z7189 Other specified counseling: Secondary | ICD-10-CM | POA: Diagnosis not present

## 2013-04-08 DIAGNOSIS — I10 Essential (primary) hypertension: Secondary | ICD-10-CM | POA: Diagnosis not present

## 2013-04-08 NOTE — Patient Instructions (Signed)
DASH Diet  The DASH diet stands for "Dietary Approaches to Stop Hypertension." It is a healthy eating plan that has been shown to reduce high blood pressure (hypertension) in as little as 14 days, while also possibly providing other significant health benefits. These other health benefits include reducing the risk of breast cancer after menopause and reducing the risk of type 2 diabetes, heart disease, colon cancer, and stroke. Health benefits also include weight loss and slowing kidney failure in patients with chronic kidney disease.   DIET GUIDELINES  · Limit salt (sodium). Your diet should contain less than 1500 mg of sodium daily.  · Limit refined or processed carbohydrates. Your diet should include mostly whole grains. Desserts and added sugars should be used sparingly.  · Include small amounts of heart-healthy fats. These types of fats include nuts, oils, and tub margarine. Limit saturated and trans fats. These fats have been shown to be harmful in the body.  CHOOSING FOODS   The following food groups are based on a 2000 calorie diet. See your Registered Dietitian for individual calorie needs.  Grains and Grain Products (6 to 8 servings daily)  · Eat More Often: Whole-wheat bread, brown rice, whole-grain or wheat pasta, quinoa, popcorn without added fat or salt (air popped).  · Eat Less Often: White bread, white pasta, white rice, cornbread.  Vegetables (4 to 5 servings daily)  · Eat More Often: Fresh, frozen, and canned vegetables. Vegetables may be raw, steamed, roasted, or grilled with a minimal amount of fat.  · Eat Less Often/Avoid: Creamed or fried vegetables. Vegetables in a cheese sauce.  Fruit (4 to 5 servings daily)  · Eat More Often: All fresh, canned (in natural juice), or frozen fruits. Dried fruits without added sugar. One hundred percent fruit juice (½ cup [237 mL] daily).  · Eat Less Often: Dried fruits with added sugar. Canned fruit in light or heavy syrup.  Lean Meats, Fish, and Poultry (2  servings or less daily. One serving is 3 to 4 oz [85-114 g]).  · Eat More Often: Ninety percent or leaner ground beef, tenderloin, sirloin. Round cuts of beef, chicken breast, turkey breast. All fish. Grill, bake, or broil your meat. Nothing should be fried.  · Eat Less Often/Avoid: Fatty cuts of meat, turkey, or chicken leg, thigh, or wing. Fried cuts of meat or fish.  Dairy (2 to 3 servings)  · Eat More Often: Low-fat or fat-free milk, low-fat plain or light yogurt, reduced-fat or part-skim cheese.  · Eat Less Often/Avoid: Milk (whole, 2%). Whole milk yogurt. Full-fat cheeses.  Nuts, Seeds, and Legumes (4 to 5 servings per week)  · Eat More Often: All without added salt.  · Eat Less Often/Avoid: Salted nuts and seeds, canned beans with added salt.  Fats and Sweets (limited)  · Eat More Often: Vegetable oils, tub margarines without trans fats, sugar-free gelatin. Mayonnaise and salad dressings.  · Eat Less Often/Avoid: Coconut oils, palm oils, butter, stick margarine, cream, half and half, cookies, candy, pie.  FOR MORE INFORMATION  The Dash Diet Eating Plan: www.dashdiet.org  Document Released: 09/18/2011 Document Revised: 12/22/2011 Document Reviewed: 09/18/2011  ExitCare® Patient Information ©2014 [employer].

## 2013-04-08 NOTE — Progress Notes (Signed)
  Subjective:    Patient ID: Theodore Frank, male    DOB: January 10, 1936, 77 y.o.   MRN: [id number]  HPI  Patient here for follow up with Dr. Perrin Maltese Patient was told to stop Tapazole and hold Amlodipine. Brought meds with him. He was still confused about meds.    Review of Systems     Objective:   Physical Exam  Vitals reviewed. Constitutional: He is oriented to person, place, and time. He appears well-developed and well-nourished.  HENT:  Right Ear: External ear normal.  Left Ear: External ear normal.  Eyes: EOM are normal.  Neck: Neck supple.  Cardiovascular: Normal rate, regular rhythm and normal heart sounds.   Pulmonary/Chest: Effort normal and breath sounds normal.  Neurological: He is alert and oriented to person, place, and time. He exhibits normal muscle tone. Coordination normal.  Psychiatric: He has a normal mood and affect.   128/74       Assessment & Plan:  DC  Tapazole/Hold amlodipine Monitor home BP RTC 3 months

## 2013-10-01 ENCOUNTER — Ambulatory Visit (INDEPENDENT_AMBULATORY_CARE_PROVIDER_SITE_OTHER): Payer: Medicare Other | Admitting: Internal Medicine

## 2013-10-01 VITALS — BP 118/70 | HR 55 | Temp 98.4°F | Resp 18 | Ht 73.0 in | Wt 184.8 lb

## 2013-10-01 DIAGNOSIS — I10 Essential (primary) hypertension: Secondary | ICD-10-CM

## 2013-10-01 DIAGNOSIS — Z113 Encounter for screening for infections with a predominantly sexual mode of transmission: Secondary | ICD-10-CM | POA: Diagnosis not present

## 2013-10-01 DIAGNOSIS — E039 Hypothyroidism, unspecified: Secondary | ICD-10-CM

## 2013-10-01 DIAGNOSIS — R7309 Other abnormal glucose: Secondary | ICD-10-CM

## 2013-10-01 DIAGNOSIS — C61 Malignant neoplasm of prostate: Secondary | ICD-10-CM

## 2013-10-01 DIAGNOSIS — R7302 Impaired glucose tolerance (oral): Secondary | ICD-10-CM

## 2013-10-01 DIAGNOSIS — Z708 Other sex counseling: Secondary | ICD-10-CM

## 2013-10-01 DIAGNOSIS — Z1159 Encounter for screening for other viral diseases: Secondary | ICD-10-CM | POA: Diagnosis not present

## 2013-10-01 DIAGNOSIS — Z114 Encounter for screening for human immunodeficiency virus [HIV]: Secondary | ICD-10-CM

## 2013-10-01 DIAGNOSIS — E785 Hyperlipidemia, unspecified: Secondary | ICD-10-CM | POA: Diagnosis not present

## 2013-10-01 DIAGNOSIS — Z118 Encounter for screening for other infectious and parasitic diseases: Secondary | ICD-10-CM

## 2013-10-01 LAB — POCT UA - MICROSCOPIC ONLY
Casts, Ur, LPF, POC: NEGATIVE
Crystals, Ur, HPF, POC: NEGATIVE
Mucus, UA: NEGATIVE
Yeast, UA: NEGATIVE

## 2013-10-01 LAB — COMPREHENSIVE METABOLIC PANEL
ALT: 18 U/L (ref 0–53)
AST: 18 U/L (ref 0–37)
Albumin: 4.3 g/dL (ref 3.5–5.2)
Alkaline Phosphatase: 63 U/L (ref 39–117)
BUN: 12 mg/dL (ref 6–23)
CO2: 28 mEq/L (ref 19–32)
Calcium: 10.5 mg/dL (ref 8.4–10.5)
Chloride: 105 mEq/L (ref 96–112)
Creat: 1.14 mg/dL (ref 0.50–1.35)
Glucose, Bld: 139 mg/dL — ABNORMAL HIGH (ref 70–99)
Potassium: 4.2 mEq/L (ref 3.5–5.3)
Sodium: 141 mEq/L (ref 135–145)
Total Bilirubin: 0.8 mg/dL (ref 0.3–1.2)
Total Protein: 7.4 g/dL (ref 6.0–8.3)

## 2013-10-01 LAB — POCT URINALYSIS DIPSTICK
Bilirubin, UA: NEGATIVE
Blood, UA: NEGATIVE
Glucose, UA: NEGATIVE
Ketones, UA: NEGATIVE
Leukocytes, UA: NEGATIVE
Nitrite, UA: NEGATIVE
Protein, UA: NEGATIVE
Spec Grav, UA: 1.015
Urobilinogen, UA: 1
pH, UA: 6

## 2013-10-01 LAB — TSH: TSH: 0.592 u[IU]/mL (ref 0.350–4.500)

## 2013-10-01 LAB — HIV ANTIBODY (ROUTINE TESTING W REFLEX): HIV: NONREACTIVE

## 2013-10-01 LAB — RPR

## 2013-10-01 LAB — GLUCOSE, POCT (MANUAL RESULT ENTRY): POC Glucose: 139 mg/dl — AB (ref 70–99)

## 2013-10-01 LAB — PSA, MEDICARE: PSA: 1.94 ng/mL (ref <=4.00)

## 2013-10-01 LAB — POCT GLYCOSYLATED HEMOGLOBIN (HGB A1C): Hemoglobin A1C: 6.4

## 2013-10-01 NOTE — Progress Notes (Signed)
   Subjective:    Patient ID: Theodore Frank, male    DOB: 01-12-1936, 77 y.o.   MRN: [id number]  HPI Doing well with htn, glucose intolerance, lipids disorder. Counseled on STDs, no sxs. No problems with meds.   Review of Systems    unchanged Objective:   Physical Exam  Constitutional: He is oriented to person, place, and time. He appears well-developed and well-nourished.  HENT:  Head: Normocephalic.  Eyes: EOM are normal.  Neck: Normal range of motion. No tracheal deviation present. No thyromegaly present.  Cardiovascular: Normal rate, regular rhythm and normal heart sounds.   Pulmonary/Chest: Effort normal.  Abdominal: Soft. There is no tenderness.  Genitourinary: Penis normal.  Musculoskeletal: Normal range of motion.  Lymphadenopathy:    He has no cervical adenopathy.  Neurological: He is alert and oriented to person, place, and time. Coordination normal.  Psychiatric: He has a normal mood and affect.   Results for orders placed in visit on 10/01/13  GLUCOSE, POCT (MANUAL RESULT ENTRY)      Result Value Range   POC Glucose 139 (*) 70 - 99 mg/dl  POCT GLYCOSYLATED HEMOGLOBIN (HGB A1C)      Result Value Range   Hemoglobin A1C 6.4    POCT UA - MICROSCOPIC ONLY      Result Value Range   WBC, Ur, HPF, POC 0-1     RBC, urine, microscopic 0-2     Bacteria, U Microscopic trace     Mucus, UA neg     Epithelial cells, urine per micros 0-1     Crystals, Ur, HPF, POC neg     Casts, Ur, LPF, POC neg     Yeast, UA neg    POCT URINALYSIS DIPSTICK      Result Value Range   Color, UA yellow     Clarity, UA clear     Glucose, UA neg     Bilirubin, UA neg     Ketones, UA neg     Spec Grav, UA 1.015     Blood, UA neg     pH, UA 6.0     Protein, UA neg     Urobilinogen, UA 1.0     Nitrite, UA neg     Leukocytes, UA Negative            Assessment & Plan:  HTN/DM/Lipids stable CPE next visit RF meds 1 year

## 2013-10-01 NOTE — Progress Notes (Signed)
   Subjective:    Patient ID: Theodore Frank, male    DOB: [birth year], 77 y.o.   MRN: [id number]  HPI Patient presents wanting STD testing.  He's in a new relationship.     Review of Systems     Objective:   Physical Exam        Assessment & Plan:

## 2013-10-01 NOTE — Patient Instructions (Signed)
Sexually Transmitted Disease °Sexually transmitted disease (STD) refers to any infection that is passed from person to person during sexual activity. This may happen by way of saliva, semen, blood, vaginal mucus, or urine. Common STDs include: °· Gonorrhea. °· Chlamydia. °· Syphilis. °· HIV/AIDS. °· Genital herpes. °· Hepatitis B and C. °· Trichomonas. °· Human papillomavirus (HPV). °· Pubic lice. °CAUSES  °An STD may be spread by bacteria, virus, or parasite. A person can get an STD by: °· Sexual intercourse with an infected person. °· Sharing sex toys with an infected person. °· Sharing needles with an infected person. °· Having intimate contact with the genitals, mouth, or rectal areas of an infected person. °SYMPTOMS  °Some people may not have any symptoms, but they can still pass the infection to others. Different STDs have different symptoms. Symptoms include: °· Painful or bloody urination. °· Pain in the pelvis, abdomen, vagina, anus, throat, or eyes. °· Skin rash, itching, irritation, growths, or sores (lesions). These usually occur in the genital or anal area. °· Abnormal vaginal discharge. °· Penile discharge in men. °· Soft, flesh-colored skin growths in the genital or anal area. °· Fever. °· Pain or bleeding during sexual intercourse. °· Swollen glands in the groin area. °· Yellow skin and eyes (jaundice). This is seen with hepatitis. °DIAGNOSIS  °To make a diagnosis, your caregiver may: °· Take a medical history. °· Perform a physical exam. °· Take a specimen (culture) to be examined. °· Examine a sample of discharge under a microscope. °· Perform blood tests. °· Perform a Pap test, if this applies. °· Perform a colposcopy. °· Perform a laparoscopy. °TREATMENT  °· Chlamydia, gonorrhea, trichomonas, and syphilis can be cured with antibiotic medicine. °· Genital herpes, hepatitis, and HIV can be treated, but not cured, with prescribed medicines. The medicines will lessen the symptoms. °· Genital warts  from HPV can be treated with medicine or by freezing, burning (electrocautery), or surgery. Warts may come back. °· HPV is a virus and cannot be cured with medicine or surgery. However, abnormal areas may be followed very closely by your caregiver and may be removed from the cervix, vagina, or vulva through office procedures or surgery. °If your diagnosis is confirmed, your recent sexual partners need treatment. This is true even if they are symptom-free or have a negative culture or evaluation. They should not have sex until their caregiver says it is okay. °HOME CARE INSTRUCTIONS °· All sexual partners should be informed, tested, and treated for all STDs. °· Take your antibiotics as directed. Finish them even if you start to feel better. °· Only take over-the-counter or prescription medicines for pain, discomfort, or fever as directed by your caregiver. °· Rest. °· Eat a balanced diet and drink enough fluids to keep your urine clear or pale yellow. °· Do not have sex until treatment is completed and you have followed up with your caregiver. STDs should be checked after treatment. °· Keep all follow-up appointments, Pap tests, and blood tests as directed by your caregiver. °· Only use latex condoms and water-soluble lubricants during sexual activity. Do not use petroleum jelly or oils. °· Avoid alcohol and illegal drugs. °· Get vaccinated for HPV and hepatitis. If you have not received these vaccines in the past, talk to your caregiver about whether one or both might be right for you. °· Avoid risky sex practices that can break the skin. °The only way to avoid getting an STD is to avoid all sexual activity. Latex condoms and dental   dams (for oral sex) will help lessen the risk of getting an STD, but will not completely eliminate the risk. °SEEK MEDICAL CARE IF:  °· You have a fever. °· You have any new or worsening symptoms. °Document Released: 12/20/2002 Document Revised: 12/22/2011 Document Reviewed:  04/19/2013 °Exit[employer]® Patient Information ©2014 [employer]. ° °

## 2013-10-03 ENCOUNTER — Encounter: Payer: Self-pay | Admitting: Radiology

## 2013-10-03 LAB — GC/CHLAMYDIA PROBE AMP
CT Probe RNA: NEGATIVE
GC Probe RNA: NEGATIVE

## 2013-10-10 ENCOUNTER — Telehealth: Payer: Self-pay

## 2013-10-10 NOTE — Telephone Encounter (Signed)
PT STATES HE RECEIVED A COPY OF HIS LABS AND DOESN'T QUITE UNDERSTAND. PLEASE CALL [phone]

## 2013-10-10 NOTE — Telephone Encounter (Signed)
Left message for him to call me back, labs normal

## 2013-10-12 NOTE — Telephone Encounter (Signed)
Patient came in office with copy of labs for review. I reviewed and explained.

## 2014-03-21 ENCOUNTER — Ambulatory Visit (INDEPENDENT_AMBULATORY_CARE_PROVIDER_SITE_OTHER): Payer: Medicare Other | Admitting: Family Medicine

## 2014-03-21 ENCOUNTER — Ambulatory Visit: Payer: Medicare Other

## 2014-03-21 VITALS — BP 138/72 | HR 72 | Temp 97.8°F | Resp 16 | Ht 72.0 in | Wt 183.0 lb

## 2014-03-21 DIAGNOSIS — R946 Abnormal results of thyroid function studies: Secondary | ICD-10-CM | POA: Diagnosis not present

## 2014-03-21 DIAGNOSIS — R7989 Other specified abnormal findings of blood chemistry: Secondary | ICD-10-CM

## 2014-03-21 DIAGNOSIS — E785 Hyperlipidemia, unspecified: Secondary | ICD-10-CM | POA: Diagnosis not present

## 2014-03-21 DIAGNOSIS — I10 Essential (primary) hypertension: Secondary | ICD-10-CM

## 2014-03-21 DIAGNOSIS — E119 Type 2 diabetes mellitus without complications: Secondary | ICD-10-CM

## 2014-03-21 LAB — GLUCOSE, POCT (MANUAL RESULT ENTRY): POC Glucose: 157 mg/dL — AB (ref 70–99)

## 2014-03-21 LAB — POCT GLYCOSYLATED HEMOGLOBIN (HGB A1C): Hemoglobin A1C: 6.8

## 2014-03-21 MED ORDER — AMLODIPINE BESYLATE 10 MG PO TABS: 10.0000 mg | ORAL_TABLET | Freq: Every day | ORAL | Status: DC

## 2014-03-21 MED ORDER — METFORMIN HCL 500 MG PO TABS: 500.0000 mg | ORAL_TABLET | Freq: Two times a day (BID) | ORAL | Status: DC

## 2014-03-21 MED ORDER — ATORVASTATIN CALCIUM 10 MG PO TABS: 10.0000 mg | ORAL_TABLET | Freq: Every day | ORAL | Status: DC

## 2014-03-21 MED ORDER — LISINOPRIL-HYDROCHLOROTHIAZIDE 20-12.5 MG PO TABS: 1.0000 | ORAL_TABLET | Freq: Every day | ORAL | Status: DC

## 2014-03-21 NOTE — Progress Notes (Signed)
Subjective:    Patient ID: Theodore Frank, male    DOB: 04/01/1936, 78 y.o.   MRN: [id number]  HPI Theodore Frank is a 78 y.o. male Follow up for DM. HTN. PCP: Dr. Elder Cyphers.   DM2 - home blood sugar - lowest 79, high 159.  Takes metformin 500mg  - 2 po qd.  No new side effects. No symptomatic lows.  Last optho eval - last year, no known retinopathy. 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    HTN - home BP's in control.  No chest pains, dizziness, or side effects of meds. No hx. of cardiac dz known.    Chemistry      Component Value Date/Time   NA 141 10/01/2013 1131   K 4.2 10/01/2013 1131   CL 105 10/01/2013 1131   CO2 28 10/01/2013 1131   BUN 12 10/01/2013 1131   CREATININE 1.14 10/01/2013 1131   CREATININE 1.15 03/02/2012 1445      Component Value Date/Time   CALCIUM 10.5 10/01/2013 1131   ALKPHOS 63 10/01/2013 1131   AST 18 10/01/2013 1131   ALT 18 10/01/2013 1131   BILITOT 0.8 10/01/2013 1131     Lab Results  Component Value Date   CHOL 154 03/25/2013   HDL 46 03/25/2013   LDLCALC 87 03/25/2013   TRIG 104 03/25/2013   CHOLHDL 3.3 03/25/2013     Hyperthyroidism? Lat tsh normal, took methimazole in past, but not in past year.   Patient Active Problem List   Diagnosis Date Noted  . Malignant neoplasm of prostate 04/05/2013  . S/P right inguinal hernia repair 04/02/2012  . Right inguinal hernia 02/25/2012  . Hypertension 01/30/2012  . Hyperthyroidism 01/30/2012  . Diabetes mellitus 01/30/2012  . Hyperlipidemia 01/30/2012   Past Medical History  Diagnosis Date  . Diabetes mellitus   . Hyperlipidemia   . Hypertension   . Cancer     colon  . Prostate cancer     radiation   Past Surgical History  Procedure Laterality Date  . Colon surgery  2001    colon resection  . Inguinal hernia repair  03/05/2012    Procedure: HERNIA REPAIR INGUINAL ADULT;  Surgeon: [name], MD;  Location: [facility];  Service: General;  Laterality: Right;  Repair right inguinal  hernia with mesh  . Prostate surgery     No Known Allergies Prior to Admission medications   Medication Sig Start Date End Date Taking? Authorizing Provider  amLODipine (NORVASC) 10 MG tablet Take 1 tablet (10 mg total) by mouth daily. 03/25/13  Yes Orma Flaming, MD  atorvastatin (LIPITOR) 10 MG tablet Take 1 tablet (10 mg total) by mouth daily. 03/25/13  Yes Orma Flaming, MD  lisinopril-hydrochlorothiazide (PRINZIDE,ZESTORETIC) 20-12.5 MG per tablet Take 1 tablet by mouth daily. 03/25/13  Yes Orma Flaming, MD  metFORMIN (GLUCOPHAGE) 500 MG tablet Take 1 tablet (500 mg total) by mouth 2 (two) times daily with a meal. 03/25/13  Yes Orma Flaming, MD   History   Social History  . Marital Status: Divorced    Spouse Name: N/A    Number of Children: N/A  . Years of Education: N/A   Occupational History  . Not on file.   Social History Main Topics  . Smoking status: Former Smoker    Quit date: 10/13/1965  . Smokeless tobacco: Not on file  . Alcohol Use: No  . Drug Use: No  . Sexual Activity: Yes    Birth  Control/ Protection: None   Other Topics Concern  . Not on file   Social History Narrative  . No narrative on file       Review of Systems  Constitutional: Negative for activity change, fatigue and unexpected weight change.  Eyes: Negative for visual disturbance.  Respiratory: Negative for cough, chest tightness and shortness of breath.   Cardiovascular: Negative for chest pain, palpitations and leg swelling.  Gastrointestinal: Negative for abdominal pain and blood in stool.  Endocrine: Negative for cold intolerance, heat intolerance, polydipsia, polyphagia and polyuria.  Neurological: Negative for dizziness, light-headedness and headaches.       Objective:   Physical Exam  Vitals reviewed. Constitutional: He is oriented to person, place, and time. He appears well-developed and well-nourished.  HENT:  Head: Normocephalic and atraumatic.  Eyes: Pupils are equal,  round, and reactive to light.  Neck: Normal range of motion. Neck supple. No thyromegaly present.  Cardiovascular: Normal rate, regular rhythm, normal heart sounds and intact distal pulses.   Pulmonary/Chest: Effort normal and breath sounds normal.  Abdominal: Soft. There is no tenderness.  Neurological: He is alert and oriented to person, place, and time.  Microfilament testing of feet normal bilaterally.  Skin: Skin is warm, dry and intact. No rash noted.  Psychiatric: He has a normal mood and affect. His behavior is normal.   Filed Vitals:   03/21/14 1109  BP: 138/72  Pulse: 72  Temp: 97.8 F (36.6 C)  TempSrc: Oral  Resp: 16  Height: 6' (1.829 m)  Weight: 183 lb (83.008 kg)  SpO2: 96%    Results for orders placed in visit on 03/21/14  GLUCOSE, POCT (MANUAL RESULT ENTRY)      Result Value Ref Range   POC Glucose 157 (*) 70 - 99 mg/dl  POCT GLYCOSYLATED HEMOGLOBIN (HGB A1C)      Result Value Ref Range   Hemoglobin A1C 6.8           Assessment & Plan:   Theodore Frank is a 78 y.o. male Diabetes - Plan: POCT glucose (manual entry), POCT glycosylated hemoglobin (Hb A1C), Comprehensive metabolic panel  - controlled. No med changes. Recommended dentist eval, check urine microalbumin.   meds refilled, recheck 48months as controlled.   Hyperlipidemia - Plan: Comprehensive metabolic panel, Lipid panel, lisinopril-hydrochlorothiazide (PRINZIDE,ZESTORETIC) 20-12.5 MG per tablet, atorvastatin (LIPITOR) 10 MG tablet - labs pending.   Abnormal TSH - Plan: TSH - unsure if hx of hyperthyroidism, off meds, recheck tsh.   Hypertension - Plan: lisinopril-hydrochlorothiazide (PRINZIDE,ZESTORETIC) 20-12.5 MG per tablet, amLODipine (NORVASC) 10 MG tablet  - stable.  meds refilled.   Plan on CPE in 6 months - with new PCP.   Meds ordered this encounter  Medications  . metFORMIN (GLUCOPHAGE) 500 MG tablet    Sig: Take 1 tablet (500 mg total) by mouth 2 (two) times daily with a meal.     Dispense:  180 tablet    Refill:  1  . lisinopril-hydrochlorothiazide (PRINZIDE,ZESTORETIC) 20-12.5 MG per tablet    Sig: Take 1 tablet by mouth daily.    Dispense:  90 tablet    Refill:  1  . atorvastatin (LIPITOR) 10 MG tablet    Sig: Take 1 tablet (10 mg total) by mouth daily.    Dispense:  90 tablet    Refill:  1  . amLODipine (NORVASC) 10 MG tablet    Sig: Take 1 tablet (10 mg total) by mouth daily.    Dispense:  90 tablet  Refill:  1   Patient Instructions  You should receive a call or letter about your lab results within the next week to 10 days.  No changes in medicines for now.  Plan on physical in next 6 months.

## 2014-03-21 NOTE — Patient Instructions (Signed)
You should receive a call or letter about your lab results within the next week to 10 days.  No changes in medicines for now.  Plan on physical in next 6 months.

## 2014-03-22 LAB — COMPREHENSIVE METABOLIC PANEL
ALT: 17 U/L (ref 0–53)
AST: 17 U/L (ref 0–37)
Albumin: 3.9 g/dL (ref 3.5–5.2)
Alkaline Phosphatase: 52 U/L (ref 39–117)
BUN: 18 mg/dL (ref 6–23)
CO2: 23 mEq/L (ref 19–32)
Calcium: 10.2 mg/dL (ref 8.4–10.5)
Chloride: 104 mEq/L (ref 96–112)
Creat: 1.12 mg/dL (ref 0.50–1.35)
Glucose, Bld: 156 mg/dL — ABNORMAL HIGH (ref 70–99)
Potassium: 3.7 mEq/L (ref 3.5–5.3)
Sodium: 139 mEq/L (ref 135–145)
Total Bilirubin: 0.7 mg/dL (ref 0.2–1.2)
Total Protein: 6.9 g/dL (ref 6.0–8.3)

## 2014-03-22 LAB — MICROALBUMIN, URINE: Microalb, Ur: 0.72 mg/dL (ref 0.00–1.89)

## 2014-03-22 LAB — LIPID PANEL
Cholesterol: 135 mg/dL (ref 0–200)
HDL: 40 mg/dL (ref >39)
LDL Cholesterol: 73 mg/dL (ref 0–99)
Total CHOL/HDL Ratio: 3.4 Ratio
Triglycerides: 111 mg/dL (ref <150)
VLDL: 22 mg/dL (ref 0–40)

## 2014-03-22 LAB — TSH: TSH: 0.865 u[IU]/mL (ref 0.350–4.500)

## 2014-09-01 ENCOUNTER — Ambulatory Visit (INDEPENDENT_AMBULATORY_CARE_PROVIDER_SITE_OTHER): Payer: Medicare Other | Admitting: Emergency Medicine

## 2014-09-01 VITALS — BP 128/83 | HR 62 | Temp 97.7°F | Resp 18 | Wt 188.0 lb

## 2014-09-01 DIAGNOSIS — H2513 Age-related nuclear cataract, bilateral: Secondary | ICD-10-CM | POA: Diagnosis not present

## 2014-09-01 DIAGNOSIS — E119 Type 2 diabetes mellitus without complications: Secondary | ICD-10-CM | POA: Diagnosis not present

## 2014-09-01 DIAGNOSIS — M5432 Sciatica, left side: Secondary | ICD-10-CM

## 2014-09-01 DIAGNOSIS — S335XXA Sprain of ligaments of lumbar spine, initial encounter: Secondary | ICD-10-CM

## 2014-09-01 MED ORDER — CYCLOBENZAPRINE HCL 10 MG PO TABS: 10.0000 mg | ORAL_TABLET | Freq: Three times a day (TID) | ORAL | Status: DC | PRN

## 2014-09-01 MED ORDER — NAPROXEN SODIUM 550 MG PO TABS: 550.0000 mg | ORAL_TABLET | Freq: Two times a day (BID) | ORAL | Status: DC

## 2014-09-01 NOTE — Progress Notes (Signed)
[facility] [address] [place] [address] [phone]  Date:  09/01/2014   Name:  Theodore Frank   DOB:  1936/07/24   MRN:  [id number]  PCP:  [name], MD    Chief Complaint: Leg Pain   History of Present Illness:  Theodore Frank is a 78 y.o. very pleasant male patient who presents with the following:  Developed pain in left lumbar region Sunday afternoon.  By Monday had pain down left leg from sciatic notch to left lateral calf. No weakness or paresthesias No history of injury or overuse. Worse when stands or lays No improvement with over the counter medications or other home remedies.  Denies other complaint or health concern today.   Patient Active Problem List   Diagnosis Date Noted  . Malignant neoplasm of prostate 04/05/2013  . S/P right inguinal hernia repair 04/02/2012  . Right inguinal hernia 02/25/2012  . Hypertension 01/30/2012  . Hyperthyroidism 01/30/2012  . Diabetes mellitus 01/30/2012  . Hyperlipidemia 01/30/2012    Past Medical History  Diagnosis Date  . Diabetes mellitus   . Hyperlipidemia   . Hypertension   . Cancer     colon  . Prostate cancer     radiation    Past Surgical History  Procedure Laterality Date  . Colon surgery  2001    colon resection  . Inguinal hernia repair  03/05/2012    Procedure: HERNIA REPAIR INGUINAL ADULT;  Surgeon: [name], MD;  Location: [facility];  Service: General;  Laterality: Right;  Repair right inguinal hernia with mesh  . Prostate surgery      History  Substance Use Topics  . Smoking status: Former Smoker    Quit date: 10/13/1965  . Smokeless tobacco: Not on file  . Alcohol Use: No    No family history on file.  No Known Allergies  Medication list has been reviewed and updated.  Current Outpatient Prescriptions on File Prior to Visit  Medication Sig Dispense Refill  . amLODipine (NORVASC) 10 MG tablet Take 1 tablet (10 mg total) by mouth  daily. 90 tablet 1  . atorvastatin (LIPITOR) 10 MG tablet Take 1 tablet (10 mg total) by mouth daily. 90 tablet 1  . lisinopril-hydrochlorothiazide (PRINZIDE,ZESTORETIC) 20-12.5 MG per tablet Take 1 tablet by mouth daily. 90 tablet 1  . metFORMIN (GLUCOPHAGE) 500 MG tablet Take 1 tablet (500 mg total) by mouth 2 (two) times daily with a meal. 180 tablet 1   No current facility-administered medications on file prior to visit.    Review of Systems:  As per HPI, otherwise negative.    Physical Examination: Filed Vitals:   09/01/14 0806  BP: 128/83  Pulse: 62  Temp: 97.7 F (36.5 C)  Resp: 18   Filed Vitals:   09/01/14 0806  Weight: 188 lb (85.276 kg)   Body mass index is 25.49 kg/(m^2). Ideal Body Weight:    . GEN: WDWN, NAD, Non-toxic, Alert & Oriented x 3 HEENT: Atraumatic, Normocephalic.  Ears and Nose: No external deformity. EXTR: No clubbing/cyanosis/edema NEURO: Normal gait.  PSYCH: Normally interactive. Conversant. Not depressed or anxious appearing.  Calm demeanor.  BACK  Not tender LOWERS:  Gross motor and sensory intact  Assessment and Plan: Sciatic neuritis Anaprox  Flexeril Local heat Follow up in one week   Signed,  Ellison Carwin, MD

## 2014-09-01 NOTE — Patient Instructions (Signed)
Sciatica Sciatica is pain, weakness, numbness, or tingling along the path of the sciatic nerve. The nerve starts in the lower back and runs down the back of each leg. The nerve controls the muscles in the lower leg and in the back of the knee, while also providing sensation to the back of the thigh, lower leg, and the sole of your foot. Sciatica is a symptom of another medical condition. For instance, nerve damage or certain conditions, such as a herniated disk or bone spur on the spine, pinch or put pressure on the sciatic nerve. This causes the pain, weakness, or other sensations normally associated with sciatica. Generally, sciatica only affects one side of the body. CAUSES   Herniated or slipped disc.  Degenerative disk disease.  A pain disorder involving the narrow muscle in the buttocks (piriformis syndrome).  Pelvic injury or fracture.  Pregnancy.  Tumor (rare). SYMPTOMS  Symptoms can vary from mild to very severe. The symptoms usually travel from the low back to the buttocks and down the back of the leg. Symptoms can include:  Mild tingling or dull aches in the lower back, leg, or hip.  Numbness in the back of the calf or sole of the foot.  Burning sensations in the lower back, leg, or hip.  Sharp pains in the lower back, leg, or hip.  Leg weakness.  Severe back pain inhibiting movement. These symptoms may get worse with coughing, sneezing, laughing, or prolonged sitting or standing. Also, being overweight may worsen symptoms. DIAGNOSIS  Your caregiver will perform a physical exam to look for common symptoms of sciatica. He or she may ask you to do certain movements or activities that would trigger sciatic nerve pain. Other tests may be performed to find the cause of the sciatica. These may include:  Blood tests.  X-rays.  Imaging tests, such as an MRI or CT scan. TREATMENT  Treatment is directed at the cause of the sciatic pain. Sometimes, treatment is not necessary  and the pain and discomfort goes away on its own. If treatment is needed, your caregiver may suggest:  Over-the-counter medicines to relieve pain.  Prescription medicines, such as anti-inflammatory medicine, muscle relaxants, or narcotics.  Applying heat or ice to the painful area.  Steroid injections to lessen pain, irritation, and inflammation around the nerve.  Reducing activity during periods of pain.  Exercising and stretching to strengthen your abdomen and improve flexibility of your spine. Your caregiver may suggest losing weight if the extra weight makes the back pain worse.  Physical therapy.  Surgery to eliminate what is pressing or pinching the nerve, such as a bone spur or part of a herniated disk. HOME CARE INSTRUCTIONS   Only take over-the-counter or prescription medicines for pain or discomfort as directed by your caregiver.  Apply ice to the affected area for 20 minutes, 3-4 times a day for the first 48-72 hours. Then try heat in the same way.  Exercise, stretch, or perform your usual activities if these do not aggravate your pain.  Attend physical therapy sessions as directed by your caregiver.  Keep all follow-up appointments as directed by your caregiver.  Do not wear high heels or shoes that do not provide proper support.  Check your mattress to see if it is too soft. A firm mattress may lessen your pain and discomfort. SEEK IMMEDIATE MEDICAL CARE IF:   You lose control of your bowel or bladder (incontinence).  You have increasing weakness in the lower back, pelvis, buttocks,   or legs.  You have redness or swelling of your back.  You have a burning sensation when you urinate.  You have pain that gets worse when you lie down or awakens you at night.  Your pain is worse than you have experienced in the past.  Your pain is lasting longer than 4 weeks.  You are suddenly losing weight without reason. MAKE SURE YOU:  Understand these  instructions.  Will watch your condition.  Will get help right away if you are not doing well or get worse. Document Released: 09/23/2001 Document Revised: 03/30/2012 Document Reviewed: 02/08/2012 Exit[employer] Patient Information 2015 [employer]. This information is not intended to replace advice given to you by your health care provider. Make sure you discuss any questions you have with your health care provider.  

## 2014-10-11 ENCOUNTER — Ambulatory Visit (INDEPENDENT_AMBULATORY_CARE_PROVIDER_SITE_OTHER): Payer: Medicare Other

## 2014-10-11 ENCOUNTER — Ambulatory Visit (INDEPENDENT_AMBULATORY_CARE_PROVIDER_SITE_OTHER): Payer: Medicare Other | Admitting: Emergency Medicine

## 2014-10-11 VITALS — BP 148/82 | HR 68 | Temp 98.2°F | Resp 16 | Ht 73.0 in | Wt 187.8 lb

## 2014-10-11 DIAGNOSIS — E119 Type 2 diabetes mellitus without complications: Secondary | ICD-10-CM

## 2014-10-11 DIAGNOSIS — M25552 Pain in left hip: Secondary | ICD-10-CM

## 2014-10-11 DIAGNOSIS — M5442 Lumbago with sciatica, left side: Secondary | ICD-10-CM

## 2014-10-11 DIAGNOSIS — Z125 Encounter for screening for malignant neoplasm of prostate: Secondary | ICD-10-CM

## 2014-10-11 LAB — COMPREHENSIVE METABOLIC PANEL
ALT: 13 U/L (ref 0–53)
AST: 13 U/L (ref 0–37)
Albumin: 3.9 g/dL (ref 3.5–5.2)
Alkaline Phosphatase: 45 U/L (ref 39–117)
BUN: 16 mg/dL (ref 6–23)
CO2: 29 mEq/L (ref 19–32)
Calcium: 10.5 mg/dL (ref 8.4–10.5)
Chloride: 103 mEq/L (ref 96–112)
Creat: 1.13 mg/dL (ref 0.50–1.35)
Glucose, Bld: 155 mg/dL — ABNORMAL HIGH (ref 70–99)
Potassium: 3.9 mEq/L (ref 3.5–5.3)
Sodium: 140 mEq/L (ref 135–145)
Total Bilirubin: 0.7 mg/dL (ref 0.2–1.2)
Total Protein: 6.9 g/dL (ref 6.0–8.3)

## 2014-10-11 LAB — IFOBT (OCCULT BLOOD): IFOBT: NEGATIVE

## 2014-10-11 LAB — GLUCOSE, POCT (MANUAL RESULT ENTRY): POC Glucose: 162 mg/dl — AB (ref 70–99)

## 2014-10-11 MED ORDER — PREDNISONE 10 MG PO TABS: ORAL_TABLET | ORAL | Status: DC

## 2014-10-11 NOTE — Patient Instructions (Signed)

## 2014-10-11 NOTE — Progress Notes (Addendum)
Subjective:    Patient ID: Theodore Frank, male    DOB: 12/08/1935, 78 y.o.   MRN: [id number]  This chart was scribed for  by Stephania Fragmin, ED Scribe. This patient was seen in room 8 and the patient's care was started at 11:52 AM.   HPI  HPI Comments: Theodore Frank is a 78 y.o. male with a history of DM who presents to the [facility] complaining of constant pain in his lateral left thigh and left calf that began 1 week ago. He also notes some mild left lower back pain. He states he feels the most pain "deep inside" his left calf and left lateral upper thigh. He states he was last seen by Dr. Ouida Sills here about 6 weeks ago for back pain, which he was told was a spasm. He has tried Advil and a muscle relaxant, prescribed by Dr. Ouida Sills, for this pain. Patient states he has had DM for about 8 years. Last prostate check was 2 years ago.   Review of Systems  Musculoskeletal: Positive for myalgias and back pain.     Prior to Admission medications   Medication Sig Start Date End Date Taking? Authorizing Provider  amLODipine (NORVASC) 10 MG tablet Take 1 tablet (10 mg total) by mouth daily. 03/21/14  Yes Wendie Agreste, MD  atorvastatin (LIPITOR) 10 MG tablet Take 1 tablet (10 mg total) by mouth daily. 03/21/14  Yes Wendie Agreste, MD  cyclobenzaprine (FLEXERIL) 10 MG tablet Take 1 tablet (10 mg total) by mouth 3 (three) times daily as needed for muscle spasms. 09/01/14  Yes Roselee Culver, MD  lisinopril-hydrochlorothiazide (PRINZIDE,ZESTORETIC) 20-12.5 MG per tablet Take 1 tablet by mouth daily. 03/21/14  Yes Wendie Agreste, MD  metFORMIN (GLUCOPHAGE) 500 MG tablet Take 1 tablet (500 mg total) by mouth 2 (two) times daily with a meal. 03/21/14  Yes Wendie Agreste, MD  naproxen sodium (ANAPROX DS) 550 MG tablet Take 1 tablet (550 mg total) by mouth 2 (two) times daily with a meal. 09/01/14 09/01/15 Yes Roselee Culver, MD        Objective:   Physical Exam  Nursing  note and vitals reviewed.  CONSTITUTIONAL: Well developed/well nourished HEAD: Normocephalic/atraumatic EYES: EOMI/PERRL ENMT: Mucous membranes moist NECK: supple no meningeal signs SPINE/BACK:entire spine nontender. There is mild tenderness over the left lower back.  CV: S1/S2 noted, no murmurs/rubs/gallops noted LUNGS: Lungs are clear to auscultation bilaterally, no apparent distress ABDOMEN: soft, nontender, no rebound or guarding, bowel sounds noted throughout abdomen. No masses. GU:no cva tenderness. Rectal exam showed normal size prostate without nodules.  NEURO: Pt is awake/alert/appropriate, moves all extremitiesx4.  No facial droop. He has a 2+ right knee reflex and 1+ left knee reflex. Motor strength 4/5 and symmetric.  EXTREMITIES: pulses normal/equal, full ROM SKIN: warm, color normal PSYCH: no abnormalities of mood noted, alert and oriented to situation [facility] reading (PRIMARY) by  Dr. Everlene Farrier there is severe L5-S1 degenerative disc disease. In the sacral area there is a 1 1/2 cm irregular sclerotic area  And a questionable lytic area superior sacrum on the left   Results for orders placed or performed in visit on 10/11/14  IFOBT POC (occult bld, rslt in office)  Result Value Ref Range   IFOBT Negative   POCT glucose (manual entry)  Result Value Ref Range   POC Glucose 162 (A) 70 - 99 mg/dl      Assessment & Plan:  Patient to be  on a prednisone Dosepak. PSA was done to follow-up on his prostate cancer MRI of the lumbar spine was ordered.I personally performed the services described in this documentation, which was scribed in my presence. The recorded information has been reviewed and is accurate. Sugar was 162 and he has continued on his medication for this.

## 2014-10-12 LAB — PSA, MEDICARE: PSA: 1.99 ng/mL (ref <=4.00)

## 2014-10-19 ENCOUNTER — Other Ambulatory Visit: Payer: Self-pay | Admitting: Emergency Medicine

## 2014-10-19 ENCOUNTER — Ambulatory Visit
Admission: RE | Admit: 2014-10-19 | Discharge: 2014-10-19 | Disposition: A | Discharge status: Home / Self Care | Payer: Medicare Other | Source: Ambulatory Visit | Attending: Emergency Medicine | Admitting: Emergency Medicine

## 2014-10-19 DIAGNOSIS — M5137 Other intervertebral disc degeneration, lumbosacral region: Secondary | ICD-10-CM | POA: Diagnosis not present

## 2014-10-19 DIAGNOSIS — M47817 Spondylosis without myelopathy or radiculopathy, lumbosacral region: Secondary | ICD-10-CM | POA: Diagnosis not present

## 2014-10-19 DIAGNOSIS — M5116 Intervertebral disc disorders with radiculopathy, lumbar region: Secondary | ICD-10-CM

## 2014-10-19 DIAGNOSIS — M5442 Lumbago with sciatica, left side: Secondary | ICD-10-CM

## 2014-10-19 DIAGNOSIS — M4806 Spinal stenosis, lumbar region: Secondary | ICD-10-CM | POA: Diagnosis not present

## 2014-11-02 ENCOUNTER — Other Ambulatory Visit: Payer: Self-pay | Admitting: Neurosurgery

## 2014-11-02 DIAGNOSIS — M5126 Other intervertebral disc displacement, lumbar region: Secondary | ICD-10-CM | POA: Diagnosis not present

## 2014-11-02 DIAGNOSIS — M5416 Radiculopathy, lumbar region: Secondary | ICD-10-CM | POA: Diagnosis not present

## 2014-11-03 ENCOUNTER — Encounter (HOSPITAL_COMMUNITY): Payer: Self-pay | Admitting: *Deleted

## 2014-11-06 ENCOUNTER — Inpatient Hospital Stay (HOSPITAL_COMMUNITY): Payer: Medicare Other

## 2014-11-06 ENCOUNTER — Inpatient Hospital Stay (HOSPITAL_COMMUNITY): Payer: Medicare Other | Admitting: Anesthesiology

## 2014-11-06 ENCOUNTER — Encounter (HOSPITAL_COMMUNITY): Payer: Self-pay | Admitting: *Deleted

## 2014-11-06 ENCOUNTER — Ambulatory Visit (HOSPITAL_COMMUNITY)
Admission: RE | Admit: 2014-11-06 | Discharge: 2014-11-07 | Disposition: A | Discharge status: Home / Self Care | Payer: Medicare Other | Source: Ambulatory Visit | Attending: Neurosurgery | Admitting: Neurosurgery

## 2014-11-06 ENCOUNTER — Encounter (HOSPITAL_COMMUNITY)
Admission: RE | Disposition: A | Discharge status: Home / Self Care | Payer: Medicare Other | Source: Ambulatory Visit | Attending: Neurosurgery

## 2014-11-06 DIAGNOSIS — M1990 Unspecified osteoarthritis, unspecified site: Secondary | ICD-10-CM | POA: Diagnosis not present

## 2014-11-06 DIAGNOSIS — E785 Hyperlipidemia, unspecified: Secondary | ICD-10-CM | POA: Insufficient documentation

## 2014-11-06 DIAGNOSIS — Z87891 Personal history of nicotine dependence: Secondary | ICD-10-CM | POA: Insufficient documentation

## 2014-11-06 DIAGNOSIS — Z419 Encounter for procedure for purposes other than remedying health state, unspecified: Secondary | ICD-10-CM

## 2014-11-06 DIAGNOSIS — I10 Essential (primary) hypertension: Secondary | ICD-10-CM | POA: Insufficient documentation

## 2014-11-06 DIAGNOSIS — M5126 Other intervertebral disc displacement, lumbar region: Secondary | ICD-10-CM | POA: Diagnosis not present

## 2014-11-06 DIAGNOSIS — M5116 Intervertebral disc disorders with radiculopathy, lumbar region: Principal | ICD-10-CM | POA: Insufficient documentation

## 2014-11-06 DIAGNOSIS — Z8546 Personal history of malignant neoplasm of prostate: Secondary | ICD-10-CM | POA: Insufficient documentation

## 2014-11-06 DIAGNOSIS — E119 Type 2 diabetes mellitus without complications: Secondary | ICD-10-CM | POA: Diagnosis not present

## 2014-11-06 DIAGNOSIS — E0590 Thyrotoxicosis, unspecified without thyrotoxic crisis or storm: Secondary | ICD-10-CM | POA: Insufficient documentation

## 2014-11-06 DIAGNOSIS — Z85038 Personal history of other malignant neoplasm of large intestine: Secondary | ICD-10-CM | POA: Diagnosis not present

## 2014-11-06 DIAGNOSIS — Z9889 Other specified postprocedural states: Secondary | ICD-10-CM | POA: Diagnosis not present

## 2014-11-06 DIAGNOSIS — C61 Malignant neoplasm of prostate: Secondary | ICD-10-CM | POA: Diagnosis not present

## 2014-11-06 DIAGNOSIS — R0602 Shortness of breath: Secondary | ICD-10-CM | POA: Diagnosis not present

## 2014-11-06 HISTORY — DX: Unspecified osteoarthritis, unspecified site: M19.90

## 2014-11-06 HISTORY — PX: LUMBAR LAMINECTOMY/DECOMPRESSION MICRODISCECTOMY: SHX5026

## 2014-11-06 HISTORY — DX: Reserved for inherently not codable concepts without codable children: IMO0001

## 2014-11-06 LAB — SURGICAL PCR SCREEN
MRSA, PCR: NEGATIVE
Staphylococcus aureus: NEGATIVE

## 2014-11-06 LAB — BASIC METABOLIC PANEL
Anion gap: 11 (ref 5–15)
BUN: 22 mg/dL (ref 6–23)
CO2: 20 mmol/L (ref 19–32)
Calcium: 10.2 mg/dL (ref 8.4–10.5)
Chloride: 106 mmol/L (ref 96–112)
Creatinine, Ser: 1.43 mg/dL — ABNORMAL HIGH (ref 0.50–1.35)
GFR calc Af Amer: 53 mL/min — ABNORMAL LOW (ref >90)
GFR calc non Af Amer: 46 mL/min — ABNORMAL LOW (ref >90)
Glucose, Bld: 295 mg/dL — ABNORMAL HIGH (ref 70–99)
Potassium: 4.8 mmol/L (ref 3.5–5.1)
Sodium: 137 mmol/L (ref 135–145)

## 2014-11-06 LAB — GLUCOSE, CAPILLARY
Glucose-Capillary: 280 mg/dL — ABNORMAL HIGH (ref 70–99)
Glucose-Capillary: 215 mg/dL — ABNORMAL HIGH (ref 70–99)
Glucose-Capillary: 416 mg/dL — ABNORMAL HIGH (ref 70–99)

## 2014-11-06 LAB — CBC
HCT: 37.9 % — ABNORMAL LOW (ref 39.0–52.0)
Hemoglobin: 12.8 g/dL — ABNORMAL LOW (ref 13.0–17.0)
MCH: 29.8 pg (ref 26.0–34.0)
MCHC: 33.8 g/dL (ref 30.0–36.0)
MCV: 88.3 fL (ref 78.0–100.0)
Platelets: 267 10*3/uL (ref 150–400)
RBC: 4.29 MIL/uL (ref 4.22–5.81)
RDW: 12.6 % (ref 11.5–15.5)
WBC: 9 10*3/uL (ref 4.0–10.5)

## 2014-11-06 SURGERY — LUMBAR LAMINECTOMY/DECOMPRESSION MICRODISCECTOMY 1 LEVEL
Anesthesia: General | Site: Back | Laterality: Left

## 2014-11-06 MED ORDER — PHENOL 1.4 % MT LIQD: 1.0000 | OROMUCOSAL | Status: DC | PRN

## 2014-11-06 MED ORDER — NAPROXEN 500 MG PO TABS
500.0000 mg | ORAL_TABLET | Freq: Two times a day (BID) | ORAL | Status: DC
  Administered 2014-11-07: 500 mg via ORAL
  Filled 2014-11-06 (×3): qty 1

## 2014-11-06 MED ORDER — ARTIFICIAL TEARS OP OINT
TOPICAL_OINTMENT | OPHTHALMIC | Status: DC | PRN
  Administered 2014-11-06: 1 via OPHTHALMIC

## 2014-11-06 MED ORDER — SODIUM CHLORIDE 0.9 % IV SOLN
250.0000 mL | INTRAVENOUS | Status: DC
Start: 2014-11-06 — End: 2014-11-07

## 2014-11-06 MED ORDER — OXYCODONE-ACETAMINOPHEN 5-325 MG PO TABS: 1.0000 | ORAL_TABLET | ORAL | Status: DC | PRN

## 2014-11-06 MED ORDER — PHENYLEPHRINE 40 MCG/ML (10ML) SYRINGE FOR IV PUSH (FOR BLOOD PRESSURE SUPPORT)
PREFILLED_SYRINGE | INTRAVENOUS | Status: AC
  Filled 2014-11-06: qty 20

## 2014-11-06 MED ORDER — LISINOPRIL-HYDROCHLOROTHIAZIDE 20-12.5 MG PO TABS: 1.0000 | ORAL_TABLET | Freq: Every day | ORAL | Status: DC

## 2014-11-06 MED ORDER — MUPIROCIN 2 % EX OINT
1.0000 "application " | TOPICAL_OINTMENT | Freq: Once | CUTANEOUS | Status: AC
  Administered 2014-11-06: 1 via TOPICAL

## 2014-11-06 MED ORDER — MUPIROCIN 2 % EX OINT
TOPICAL_OINTMENT | CUTANEOUS | Status: AC
  Filled 2014-11-06: qty 22

## 2014-11-06 MED ORDER — CYCLOBENZAPRINE HCL 10 MG PO TABS: 10.0000 mg | ORAL_TABLET | Freq: Three times a day (TID) | ORAL | Status: DC | PRN

## 2014-11-06 MED ORDER — LACTATED RINGERS IV SOLN: INTRAVENOUS | Status: DC | PRN

## 2014-11-06 MED ORDER — SODIUM CHLORIDE 0.9 % IR SOLN
Status: DC | PRN
  Administered 2014-11-06: 1000 mL

## 2014-11-06 MED ORDER — HYDROMORPHONE HCL 1 MG/ML IJ SOLN: 0.5000 mg | INTRAMUSCULAR | Status: DC | PRN

## 2014-11-06 MED ORDER — EPHEDRINE SULFATE 50 MG/ML IJ SOLN
INTRAMUSCULAR | Status: DC | PRN
  Administered 2014-11-06 (×3): 10 mg via INTRAVENOUS

## 2014-11-06 MED ORDER — GLYCOPYRROLATE 0.2 MG/ML IJ SOLN
INTRAMUSCULAR | Status: AC
  Filled 2014-11-06: qty 3

## 2014-11-06 MED ORDER — SODIUM CHLORIDE 0.9 % IJ SOLN: 3.0000 mL | INTRAMUSCULAR | Status: DC | PRN

## 2014-11-06 MED ORDER — OXYCODONE HCL 5 MG PO TABS: 5.0000 mg | ORAL_TABLET | ORAL | Status: DC | PRN

## 2014-11-06 MED ORDER — FENTANYL CITRATE 0.05 MG/ML IJ SOLN
INTRAMUSCULAR | Status: AC
  Filled 2014-11-06: qty 5

## 2014-11-06 MED ORDER — INSULIN ASPART 100 UNIT/ML ~~LOC~~ SOLN
0.0000 [IU] | Freq: Every day | SUBCUTANEOUS | Status: DC
  Administered 2014-11-06: 5 [IU] via SUBCUTANEOUS

## 2014-11-06 MED ORDER — PHENYLEPHRINE HCL 10 MG/ML IJ SOLN
INTRAMUSCULAR | Status: DC | PRN
  Administered 2014-11-06 (×3): 80 ug via INTRAVENOUS
  Administered 2014-11-06 (×2): 120 ug via INTRAVENOUS

## 2014-11-06 MED ORDER — PROMETHAZINE HCL 25 MG/ML IJ SOLN: 6.2500 mg | INTRAMUSCULAR | Status: DC | PRN

## 2014-11-06 MED ORDER — ROCURONIUM BROMIDE 50 MG/5ML IV SOLN
INTRAVENOUS | Status: AC
  Filled 2014-11-06: qty 1

## 2014-11-06 MED ORDER — INSULIN ASPART 100 UNIT/ML ~~LOC~~ SOLN
0.0000 [IU] | Freq: Three times a day (TID) | SUBCUTANEOUS | Status: DC
  Administered 2014-11-07: 5 [IU] via SUBCUTANEOUS

## 2014-11-06 MED ORDER — ACETAMINOPHEN 325 MG PO TABS: 650.0000 mg | ORAL_TABLET | ORAL | Status: DC | PRN

## 2014-11-06 MED ORDER — LIDOCAINE HCL 4 % MT SOLN
OROMUCOSAL | Status: DC | PRN
  Administered 2014-11-06: 4 mL via TOPICAL

## 2014-11-06 MED ORDER — THROMBIN 5000 UNITS EX SOLR
CUTANEOUS | Status: DC | PRN
  Administered 2014-11-06 (×2): 5000 [IU] via TOPICAL

## 2014-11-06 MED ORDER — ONDANSETRON HCL 4 MG/2ML IJ SOLN: 4.0000 mg | INTRAMUSCULAR | Status: DC | PRN

## 2014-11-06 MED ORDER — HEMOSTATIC AGENTS (NO CHARGE) OPTIME
TOPICAL | Status: DC | PRN
  Administered 2014-11-06: 1 via TOPICAL

## 2014-11-06 MED ORDER — NEOSTIGMINE METHYLSULFATE 10 MG/10ML IV SOLN
INTRAVENOUS | Status: DC | PRN
  Administered 2014-11-06: 3 mg via INTRAVENOUS

## 2014-11-06 MED ORDER — ONDANSETRON HCL 4 MG/2ML IJ SOLN
INTRAMUSCULAR | Status: AC
  Filled 2014-11-06: qty 2

## 2014-11-06 MED ORDER — PROPOFOL 10 MG/ML IV BOLUS
INTRAVENOUS | Status: DC | PRN
  Administered 2014-11-06: 200 mg via INTRAVENOUS

## 2014-11-06 MED ORDER — FENTANYL CITRATE 0.05 MG/ML IJ SOLN
INTRAMUSCULAR | Status: DC | PRN
  Administered 2014-11-06: 50 ug via INTRAVENOUS
  Administered 2014-11-06: 100 ug via INTRAVENOUS

## 2014-11-06 MED ORDER — GLYCOPYRROLATE 0.2 MG/ML IJ SOLN
INTRAMUSCULAR | Status: DC | PRN
  Administered 2014-11-06: 0.6 mg via INTRAVENOUS

## 2014-11-06 MED ORDER — CEFAZOLIN SODIUM-DEXTROSE 2-3 GM-% IV SOLR
INTRAVENOUS | Status: DC | PRN
  Administered 2014-11-06: 2 g via INTRAVENOUS

## 2014-11-06 MED ORDER — ATORVASTATIN CALCIUM 10 MG PO TABS
10.0000 mg | ORAL_TABLET | Freq: Every day | ORAL | Status: DC
  Filled 2014-11-06: qty 1

## 2014-11-06 MED ORDER — LIDOCAINE-EPINEPHRINE 1 %-1:100000 IJ SOLN
INTRAMUSCULAR | Status: DC | PRN
  Administered 2014-11-06: 7 mL

## 2014-11-06 MED ORDER — NAPROXEN SODIUM 550 MG PO TABS
550.0000 mg | ORAL_TABLET | Freq: Two times a day (BID) | ORAL | Status: DC
  Filled 2014-11-06: qty 1

## 2014-11-06 MED ORDER — MENTHOL 3 MG MT LOZG: 1.0000 | LOZENGE | OROMUCOSAL | Status: DC | PRN

## 2014-11-06 MED ORDER — DOCUSATE SODIUM 100 MG PO CAPS
100.0000 mg | ORAL_CAPSULE | Freq: Two times a day (BID) | ORAL | Status: DC
  Administered 2014-11-06: 100 mg via ORAL
  Filled 2014-11-06: qty 1

## 2014-11-06 MED ORDER — ARTIFICIAL TEARS OP OINT
TOPICAL_OINTMENT | OPHTHALMIC | Status: AC
  Filled 2014-11-06: qty 3.5

## 2014-11-06 MED ORDER — SODIUM CHLORIDE 0.9 % IJ SOLN: 3.0000 mL | Freq: Two times a day (BID) | INTRAMUSCULAR | Status: DC

## 2014-11-06 MED ORDER — HYDROCHLOROTHIAZIDE 12.5 MG PO CAPS
12.5000 mg | ORAL_CAPSULE | Freq: Every day | ORAL | Status: DC
  Filled 2014-11-06: qty 1

## 2014-11-06 MED ORDER — ACETAMINOPHEN 650 MG RE SUPP: 650.0000 mg | RECTAL | Status: DC | PRN

## 2014-11-06 MED ORDER — LACTATED RINGERS IV SOLN
INTRAVENOUS | Status: DC
  Administered 2014-11-06 (×2): via INTRAVENOUS

## 2014-11-06 MED ORDER — METFORMIN HCL 500 MG PO TABS
500.0000 mg | ORAL_TABLET | Freq: Two times a day (BID) | ORAL | Status: DC
  Administered 2014-11-07: 500 mg via ORAL
  Filled 2014-11-06 (×3): qty 1

## 2014-11-06 MED ORDER — AMLODIPINE BESYLATE 10 MG PO TABS
10.0000 mg | ORAL_TABLET | Freq: Every day | ORAL | Status: DC
  Administered 2014-11-06: 10 mg via ORAL
  Filled 2014-11-06 (×2): qty 1

## 2014-11-06 MED ORDER — SODIUM CHLORIDE 0.9 % IJ SOLN
INTRAMUSCULAR | Status: AC
  Filled 2014-11-06: qty 3

## 2014-11-06 MED ORDER — CEFAZOLIN SODIUM 1-5 GM-% IV SOLN
1.0000 g | Freq: Three times a day (TID) | INTRAVENOUS | Status: DC
  Filled 2014-11-06 (×2): qty 50

## 2014-11-06 MED ORDER — BUPIVACAINE HCL (PF) 0.25 % IJ SOLN
INTRAMUSCULAR | Status: DC | PRN
  Administered 2014-11-06: 10 mL

## 2014-11-06 MED ORDER — ALUM & MAG HYDROXIDE-SIMETH 200-200-20 MG/5ML PO SUSP: 30.0000 mL | Freq: Four times a day (QID) | ORAL | Status: DC | PRN

## 2014-11-06 MED ORDER — CYCLOBENZAPRINE HCL 10 MG PO TABS
10.0000 mg | ORAL_TABLET | Freq: Three times a day (TID) | ORAL | Status: DC | PRN
Start: 2014-11-06 — End: 2014-11-06

## 2014-11-06 MED ORDER — LIDOCAINE HCL (CARDIAC) 20 MG/ML IV SOLN
INTRAVENOUS | Status: AC
  Filled 2014-11-06: qty 5

## 2014-11-06 MED ORDER — ROCURONIUM BROMIDE 100 MG/10ML IV SOLN
INTRAVENOUS | Status: DC | PRN
  Administered 2014-11-06: 20 mg via INTRAVENOUS
  Administered 2014-11-06: 10 mg via INTRAVENOUS

## 2014-11-06 MED ORDER — SODIUM CHLORIDE 0.9 % IR SOLN
Status: DC | PRN
  Administered 2014-11-06: 500 mL

## 2014-11-06 MED ORDER — ONDANSETRON HCL 4 MG/2ML IJ SOLN
INTRAMUSCULAR | Status: DC | PRN
  Administered 2014-11-06: 4 mg via INTRAVENOUS

## 2014-11-06 MED ORDER — SUCCINYLCHOLINE CHLORIDE 20 MG/ML IJ SOLN
INTRAMUSCULAR | Status: DC | PRN
  Administered 2014-11-06: 120 mg via INTRAVENOUS

## 2014-11-06 MED ORDER — HYDROMORPHONE HCL 1 MG/ML IJ SOLN: 0.2500 mg | INTRAMUSCULAR | Status: DC | PRN

## 2014-11-06 MED ORDER — LIDOCAINE HCL (CARDIAC) 20 MG/ML IV SOLN
INTRAVENOUS | Status: DC | PRN
Start: 2014-11-06 — End: 2014-11-06
  Administered 2014-11-06: 100 mg via INTRAVENOUS

## 2014-11-06 MED ORDER — CEFAZOLIN SODIUM 1-5 GM-% IV SOLN
1.0000 g | Freq: Three times a day (TID) | INTRAVENOUS | Status: AC
  Administered 2014-11-06 – 2014-11-07 (×2): 1 g via INTRAVENOUS
  Filled 2014-11-06 (×2): qty 50

## 2014-11-06 MED ORDER — OXYCODONE HCL 5 MG PO CAPS: 5.0000 mg | ORAL_CAPSULE | Freq: Four times a day (QID) | ORAL | Status: DC | PRN

## 2014-11-06 MED ORDER — LISINOPRIL 20 MG PO TABS
20.0000 mg | ORAL_TABLET | Freq: Every day | ORAL | Status: DC
  Filled 2014-11-06: qty 1

## 2014-11-06 SURGICAL SUPPLY — 54 items
BAG DECANTER FOR FLEXI CONT (MISCELLANEOUS) ×2 IMPLANT
BENZOIN TINCTURE PRP APPL 2/3 (GAUZE/BANDAGES/DRESSINGS) ×2 IMPLANT
BLADE CLIPPER SURG (BLADE) ×2 IMPLANT
BLADE SURG 11 STRL SS (BLADE) ×2 IMPLANT
BRUSH SCRUB EZ PLAIN DRY (MISCELLANEOUS) ×2 IMPLANT
BUR MATCHSTICK NEURO 3.0 LAGG (BURR) ×2 IMPLANT
BUR PRECISION FLUTE 6.0 (BURR) ×2 IMPLANT
CANISTER SUCT 3000ML (MISCELLANEOUS) ×2 IMPLANT
CONT SPEC 4OZ CLIKSEAL STRL BL (MISCELLANEOUS) ×2 IMPLANT
DECANTER SPIKE VIAL GLASS SM (MISCELLANEOUS) IMPLANT
DRAPE LAPAROTOMY 100X72X124 (DRAPES) ×2 IMPLANT
DRAPE MICROSCOPE LEICA (MISCELLANEOUS) ×2 IMPLANT
DRAPE POUCH INSTRU U-SHP 10X18 (DRAPES) ×2 IMPLANT
DRAPE PROXIMA HALF (DRAPES) IMPLANT
DRAPE SURG 17X23 STRL (DRAPES) IMPLANT
DRSG OPSITE 4X5.5 SM (GAUZE/BANDAGES/DRESSINGS) ×2 IMPLANT
DURAPREP 26ML APPLICATOR (WOUND CARE) ×2 IMPLANT
ELECT REM PT RETURN 9FT ADLT (ELECTROSURGICAL) ×2
ELECTRODE REM PT RTRN 9FT ADLT (ELECTROSURGICAL) ×1 IMPLANT
GAUZE SPONGE 4X4 12PLY STRL (GAUZE/BANDAGES/DRESSINGS) ×2 IMPLANT
GAUZE SPONGE 4X4 16PLY XRAY LF (GAUZE/BANDAGES/DRESSINGS) IMPLANT
GLOVE BIO SURGEON STRL SZ 6.5 (GLOVE) ×6 IMPLANT
GLOVE BIO SURGEON STRL SZ8 (GLOVE) ×2 IMPLANT
GLOVE BIOGEL PI IND STRL 7.0 (GLOVE) ×1 IMPLANT
GLOVE BIOGEL PI INDICATOR 7.0 (GLOVE) ×1
GLOVE ECLIPSE 6.5 STRL STRAW (GLOVE) ×2 IMPLANT
GLOVE EXAM NITRILE LRG STRL (GLOVE) IMPLANT
GLOVE EXAM NITRILE MD LF STRL (GLOVE) ×2 IMPLANT
GLOVE EXAM NITRILE XL STR (GLOVE) IMPLANT
GLOVE EXAM NITRILE XS STR PU (GLOVE) IMPLANT
GLOVE INDICATOR 8.5 STRL (GLOVE) ×2 IMPLANT
GOWN STRL REUS W/ TWL LRG LVL3 (GOWN DISPOSABLE) ×2 IMPLANT
GOWN STRL REUS W/ TWL XL LVL3 (GOWN DISPOSABLE) ×1 IMPLANT
GOWN STRL REUS W/TWL 2XL LVL3 (GOWN DISPOSABLE) IMPLANT
GOWN STRL REUS W/TWL LRG LVL3 (GOWN DISPOSABLE) ×2
GOWN STRL REUS W/TWL XL LVL3 (GOWN DISPOSABLE) ×1
KIT BASIN OR (CUSTOM PROCEDURE TRAY) ×2 IMPLANT
KIT ROOM TURNOVER OR (KITS) ×2 IMPLANT
LIQUID BAND (GAUZE/BANDAGES/DRESSINGS) ×2 IMPLANT
NEEDLE HYPO 22GX1.5 SAFETY (NEEDLE) ×2 IMPLANT
NEEDLE SPNL 22GX3.5 QUINCKE BK (NEEDLE) ×2 IMPLANT
NS IRRIG 1000ML POUR BTL (IV SOLUTION) ×2 IMPLANT
PACK LAMINECTOMY NEURO (CUSTOM PROCEDURE TRAY) ×2 IMPLANT
RUBBERBAND STERILE (MISCELLANEOUS) ×4 IMPLANT
SPONGE SURGIFOAM ABS GEL SZ50 (HEMOSTASIS) ×2 IMPLANT
STRIP CLOSURE SKIN 1/2X4 (GAUZE/BANDAGES/DRESSINGS) ×2 IMPLANT
SUT VIC AB 0 CT1 18XCR BRD8 (SUTURE) ×1 IMPLANT
SUT VIC AB 0 CT1 8-18 (SUTURE) ×1
SUT VIC AB 2-0 CT1 18 (SUTURE) ×2 IMPLANT
SUT VICRYL 4-0 PS2 18IN ABS (SUTURE) ×2 IMPLANT
SYR 20ML ECCENTRIC (SYRINGE) ×2 IMPLANT
TOWEL OR 17X24 6PK STRL BLUE (TOWEL DISPOSABLE) ×2 IMPLANT
TOWEL OR 17X26 10 PK STRL BLUE (TOWEL DISPOSABLE) ×2 IMPLANT
WATER STERILE IRR 1000ML POUR (IV SOLUTION) ×2 IMPLANT

## 2014-11-06 NOTE — Anesthesia Postprocedure Evaluation (Signed)
  Anesthesia Post-op Note  Patient: Theodore Frank  Procedure(s) Performed: Procedure(s): LEFT LUMBAR THREE-FOUR LAMINECTOMY/DECOMPRESSION MICRODISCECTOMY 1 LEVEL (Left)  Patient Location: PACU  Anesthesia Type:General  Level of Consciousness: awake, alert , oriented and patient cooperative  Airway and Oxygen Therapy: Patient Spontanous Breathing and Patient connected to nasal cannula oxygen  Post-op Pain: mild  Post-op Assessment: Post-op Vital signs reviewed, Patient's Cardiovascular Status Stable, Respiratory Function Stable, Patent Airway, No signs of Nausea or vomiting and Pain level controlled  Post-op Vital Signs: Reviewed and stable  Last Vitals:  Filed Vitals:   11/06/14 1926  BP: 140/70  Pulse: 59  Temp: 36.5 C  Resp:     Complications: No apparent anesthesia complications

## 2014-11-06 NOTE — Anesthesia Preprocedure Evaluation (Addendum)
Anesthesia Evaluation  Patient identified by MRN, date of birth, ID band Patient awake    Reviewed: Allergy & Precautions, NPO status , Patient's Chart, lab work & pertinent test results  Airway Mallampati: II  TM Distance: >3 FB Neck ROM: Full    Dental no notable dental hx.    Pulmonary neg pulmonary ROS, former smoker,  breath sounds clear to auscultation  Pulmonary exam normal       Cardiovascular hypertension, Pt. on medications Rhythm:Regular Rate:Normal     Neuro/Psych negative neurological ROS  negative psych ROS   GI/Hepatic negative GI ROS, Neg liver ROS,   Endo/Other  diabetesHyperthyroidism   Renal/GU negative Renal ROS  negative genitourinary   Musculoskeletal negative musculoskeletal ROS (+)   Abdominal   Peds negative pediatric ROS (+)  Hematology negative hematology ROS (+)   Anesthesia Other Findings   Reproductive/Obstetrics negative OB ROS                            Anesthesia Physical Anesthesia Plan  ASA: III  Anesthesia Plan: General   Post-op Pain Management:    Induction: Intravenous  Airway Management Planned: Oral ETT  Additional Equipment:   Intra-op Plan:   Post-operative Plan: Extubation in OR  Informed Consent: I have reviewed the patients History and Physical, chart, labs and discussed the procedure including the risks, benefits and alternatives for the proposed anesthesia with the patient or authorized representative who has indicated his/her understanding and acceptance.   Dental advisory given  Plan Discussed with: CRNA and Surgeon  Anesthesia Plan Comments:         Anesthesia Quick Evaluation

## 2014-11-06 NOTE — H&P (Signed)
Theodore Frank is an 79 y.o. male.   Chief Complaint: 70 year gentleman with back and left leg pain HPI: Patient is a very pleasant 78, and her last months of progressive worsening back and hip left hip and leg pain rating to his quad into the front of his shin with weakness in his left foot. Workup revealed a very large disc herniation with a free fragment migrating caudally displacing the left L4 nerve root against the left L4 pedicle. Due to patient's failed conservative treatment imaging findings clinical exam consistent with a foot drop and a large disc herniation recommended discectomy at L3-4 on the left. I extensively reviewed the risks and benefits of the operation with the patient as well as perioperative course expectations of outcome and alternatives of surgery and he understands and agrees to proceed forward.  Past Medical History  Diagnosis Date  . Diabetes mellitus   . Hyperlipidemia   . Hypertension   . Cancer     colon  . Prostate cancer     radiation  . Shortness of breath dyspnea     with exertion  . Arthritis     Past Surgical History  Procedure Laterality Date  . Colon surgery  2001    colon resection  . Inguinal hernia repair  03/05/2012    Procedure: HERNIA REPAIR INGUINAL ADULT;  Surgeon: [name], MD;  Location: [facility];  Service: General;  Laterality: Right;  Repair right inguinal hernia with mesh  . Prostate surgery    . Hernia repair Right     History reviewed. No pertinent family history. Social History:  reports that he quit smoking about 49 years ago. He does not have any smokeless tobacco history on file. He reports that he does not drink alcohol or use illicit drugs.  Allergies: No Known Allergies  Medications Prior to Admission  Medication Sig Dispense Refill  . amLODipine (NORVASC) 10 MG tablet Take 1 tablet (10 mg total) by mouth daily. 90 tablet 1  . atorvastatin (LIPITOR) 10 MG tablet Take 1 tablet (10 mg total) by  mouth daily. 90 tablet 1  . cyclobenzaprine (FLEXERIL) 10 MG tablet Take 1 tablet (10 mg total) by mouth 3 (three) times daily as needed for muscle spasms. 30 tablet 0  . lisinopril-hydrochlorothiazide (PRINZIDE,ZESTORETIC) 20-12.5 MG per tablet Take 1 tablet by mouth daily. 90 tablet 1  . metFORMIN (GLUCOPHAGE) 500 MG tablet Take 1 tablet (500 mg total) by mouth 2 (two) times daily with a meal. 180 tablet 1  . naproxen sodium (ANAPROX DS) 550 MG tablet Take 1 tablet (550 mg total) by mouth 2 (two) times daily with a meal. 40 tablet 0  . oxycodone (OXY-IR) 5 MG capsule Take 5 mg by mouth every 6 (six) hours as needed for pain.     . predniSONE (DELTASONE) 10 MG tablet Takes 6 a day for one day 5 a day for one day 4 a day for one day 3 a day for one day 2 a day for one day one a day for one day 30 tablet 0    Results for orders placed or performed during the hospital encounter of 11/06/14 (from the past 48 hour(s))  Basic metabolic panel     Status: Abnormal   Collection Time: 11/06/14  2:18 PM  Result Value Ref Range   Sodium 137 135 - 145 mmol/L   Potassium 4.8 3.5 - 5.1 mmol/L   Chloride 106 96 - 112 mmol/L  CO2 20 19 - 32 mmol/L   Glucose, Bld 295 (H) 70 - 99 mg/dL   BUN 22 6 - 23 mg/dL   Creatinine, Ser 1.43 (H) 0.50 - 1.35 mg/dL   Calcium 10.2 8.4 - 10.5 mg/dL   GFR calc non Af Amer 46 (L) >90 mL/min   GFR calc Af Amer 53 (L) >90 mL/min    Comment: (NOTE) The eGFR has been calculated using the CKD EPI equation. This calculation has not been validated in all clinical situations. eGFR's persistently <90 mL/min signify possible Chronic Kidney Disease.    Anion gap 11 5 - 15  CBC     Status: Abnormal   Collection Time: 11/06/14  2:18 PM  Result Value Ref Range   WBC 9.0 4.0 - 10.5 K/uL   RBC 4.29 4.22 - 5.81 MIL/uL   Hemoglobin 12.8 (L) 13.0 - 17.0 g/dL   HCT 37.9 (L) 39.0 - 52.0 %   MCV 88.3 78.0 - 100.0 fL   MCH 29.8 26.0 - 34.0 pg   MCHC 33.8 30.0 - 36.0 g/dL   RDW 12.6  11.5 - 15.5 %   Platelets 267 150 - 400 K/uL  Glucose, capillary     Status: Abnormal   Collection Time: 11/06/14  2:23 PM  Result Value Ref Range   Glucose-Capillary 280 (H) 70 - 99 mg/dL   No results found.  Review of Systems  HENT: Negative.   Respiratory: Negative.   Cardiovascular: Negative.   Gastrointestinal: Negative.   Genitourinary: Negative.   Musculoskeletal: Positive for myalgias and back pain.  Skin: Negative.   Neurological: Positive for tingling and sensory change.  Endo/Heme/Allergies: Negative.   Psychiatric/Behavioral: Negative.     Blood pressure 147/97, pulse 88, temperature 98.1 F (36.7 C), temperature source Oral, resp. rate 18, height 6' 2"  (1.88 m), weight 78.019 kg (172 lb), SpO2 100 %. Physical Exam  Constitutional: He is oriented to person, place, and time. He appears well-developed and well-nourished.  HENT:  Head: Normocephalic.  Eyes: Pupils are equal, round, and reactive to light.  Neck: Normal range of motion.  Respiratory: Effort normal.  GI: Soft. Bowel sounds are normal.  Neurological: He is alert and oriented to person, place, and time. GCS eye subscore is 4. GCS verbal subscore is 5. GCS motor subscore is 6.  Left-sided a complete foot drop at one to 2 out of 5 otherwise 5 out of 5  Skin: Skin is warm.     Assessment/Plan 88 showed gentleman presents with a few drop in the left L4 and a copy with a herniated disc presents for discectomy.  CRAM,GARY P 11/06/2014, 4:38 PM

## 2014-11-06 NOTE — Op Note (Signed)
Preoperative diagnosis: Left L4 radiculopathy from herniated nuclear pulposus L3-4 left  Postoperative diagnosis: Same  Procedure: Lumbar laminectomy microdiscectomy L3-4 and the left with microdissection of the left L5 for nerve root microscopic discectomy  Surgeon: Dominica Severin cram  Asst.: Ashok Pall  Anesthesia: Gen.  EBL: Minimal  History of present illness: 79 year old gentleman with a month of progressive worsening back and left leg pain rating to his hip down the anterolateral aspect of the left thigh into the front of his shin consistent with an L3 and L4 nerve root pattern. Patient also had weakness in his left foot with a virtual complete foot drop. Due to patient's progression of clinical syndrome imaging findings which showed a large disc herniation L3-4 on the left displaced the left L4 nerve root and takes her treatment I recommended lumbar laminectomy microdiscectomy. I extensively went over the risks and benefits of the operation with the patient as well as perioperative course expectations of outcome and alternatives surgery and he understands and agrees to proceed forward.  Operative procedure: Patient brought into the or was induced under general anesthesia positioned prone the Wilson frame his back was prepped and draped in routine sterile fashion preoperative x-ray localize the appropriate level so after infiltration of 10 mL lidocaine with epi a midline incision was made and Bovie light cautery was used to take down the subcutaneous tissues and subperiosteal dissections care lamina of L3 and L4 on the left. Interoperative x-ray confirmed identification appropriate level so laminotomy was begun with a high-speed drill on the inferior aspect lamina of L3 medial facet complex and superior aspect of the lamina of L5. Limb of plate was identified and removed piecemeal fashion exposing the thecal sac. Under microscopic illumination the thecal sac was identified the left L4 pedicle was  identified and the left L3 nerve root was identified densely adherent and stuck to a large free fragment disc herniation. Using a nerve hook a 4 Penfield the plane underneath the nerve root was dissected free and several very large fragments of disc removed underneath the L4 nerve root and the thecal sac. Using a coronary dilator and exploring inferior to the L4 pedicle I removed several large free fragments of disc that migrated inferiorly from the disc space. Then a inspected the disc space it was bulging and still there was a large annular rent and this was markedly degenerated so I incised the disc space and cleaned out with pituitary rongeurs and Epstein curettes. The disc was completely degenerated. At the end of the discectomy there is no further stenosis I was easily able to pass a coronary dilator medially laterally inferiorly and superiorly. There was a copious irrigated meticulous in space was maintained Gelfoam was opened up the dura the muscle fascia proximal in layers with interrupted Vicryl the skin was closed running 4 subcuticular benzoin and Steri-Strips are applied patient recovered in stable condition. At the end the case all needle counts and sponge counts were correct.

## 2014-11-06 NOTE — Transfer of Care (Signed)
Immediate Anesthesia Transfer of Care Note  Patient: Theodore Frank  Procedure(s) Performed: Procedure(s): LEFT LUMBAR THREE-FOUR LAMINECTOMY/DECOMPRESSION MICRODISCECTOMY 1 LEVEL (Left)  Patient Location: PACU  Anesthesia Type:General  Level of Consciousness: awake, alert , oriented and patient cooperative  Airway & Oxygen Therapy: Patient Spontanous Breathing and Patient connected to nasal cannula oxygen  Post-op Assessment: Report given to PACU RN, Post -op Vital signs reviewed and stable and Patient moving all extremities X 4  Post vital signs: Reviewed and stable  Complications: No apparent anesthesia complications

## 2014-11-06 NOTE — Anesthesia Procedure Notes (Signed)
Procedure Name: Intubation Date/Time: 11/06/2014 5:05 PM Performed by: Ned Grace Pre-anesthesia Checklist: Patient identified, Patient being monitored, Emergency Drugs available, Timeout performed and Suction available Patient Re-evaluated:Patient Re-evaluated prior to inductionOxygen Delivery Method: Circle system utilized Preoxygenation: Pre-oxygenation with 100% oxygen Intubation Type: IV induction Ventilation: Mask ventilation without difficulty Laryngoscope Size: Mac and 4 Grade View: Grade I Tube type: Oral Tube size: 7.5 mm Number of attempts: 1 Airway Equipment and Method: Stylet Placement Confirmation: ETT inserted through vocal cords under direct vision,  breath sounds checked- equal and bilateral and positive ETCO2 Secured at: 22 cm Tube secured with: Tape Dental Injury: Teeth and Oropharynx as per pre-operative assessment

## 2014-11-07 ENCOUNTER — Encounter (HOSPITAL_COMMUNITY): Payer: Self-pay | Admitting: Neurosurgery

## 2014-11-07 DIAGNOSIS — E119 Type 2 diabetes mellitus without complications: Secondary | ICD-10-CM | POA: Diagnosis not present

## 2014-11-07 DIAGNOSIS — M5116 Intervertebral disc disorders with radiculopathy, lumbar region: Secondary | ICD-10-CM | POA: Diagnosis not present

## 2014-11-07 DIAGNOSIS — M1990 Unspecified osteoarthritis, unspecified site: Secondary | ICD-10-CM | POA: Diagnosis not present

## 2014-11-07 DIAGNOSIS — I10 Essential (primary) hypertension: Secondary | ICD-10-CM | POA: Diagnosis not present

## 2014-11-07 DIAGNOSIS — E785 Hyperlipidemia, unspecified: Secondary | ICD-10-CM | POA: Diagnosis not present

## 2014-11-07 DIAGNOSIS — E0590 Thyrotoxicosis, unspecified without thyrotoxic crisis or storm: Secondary | ICD-10-CM | POA: Diagnosis not present

## 2014-11-07 LAB — GLUCOSE, CAPILLARY
Glucose-Capillary: 256 mg/dL — ABNORMAL HIGH (ref 70–99)
Glucose-Capillary: 301 mg/dL — ABNORMAL HIGH (ref 70–99)
Glucose-Capillary: 211 mg/dL — ABNORMAL HIGH (ref 70–99)

## 2014-11-07 MED ORDER — OXYCODONE HCL 5 MG PO TABS: 5.0000 mg | ORAL_TABLET | ORAL | Status: DC | PRN

## 2014-11-07 NOTE — Progress Notes (Signed)
Patient ID: Theodore Frank, male   DOB: 1936/04/23, 79 y.o.   MRN: [id number] Doing well significant proven leg pain minimal back pain discharged home

## 2014-11-07 NOTE — Progress Notes (Signed)
Pt doing well. Pt given D/C instructions with Rx, verbal understanding was provided. Pt's incision is clean and dry with no sign of infection. Pt's IV was removed prior to D/C. AFO was fitted and given to Pt for home use. Pt D/C'd home via wheelchair @ 1225 per MD order. Pt is stable @ D/C and has no other needs. Holli Humbles, RN

## 2014-11-07 NOTE — Discharge Instructions (Signed)
No lifting no bending no twisting no driving a riding a car unless he is coming back and forth to see me. Keep the incision clean dry and intact.    Wound Care Keep incision covered and dry for one week.  If you shower prior to then, cover incision with plastic wrap.  You may remove outer bandage after one week and shower.  Do not put any creams, lotions, or ointments on incision. Leave steri-strips on neck.  They will fall off by themselves. Activity Walk each and every day, increasing distance each day. No lifting greater than 5 lbs.  Avoid bending, arching, or twisting. No driving for 2 weeks; may ride as a passenger locally. If provided with back brace, wear when out of bed.  It is not necessary to wear in bed. Diet Resume your normal diet.  Return to Work Will be discussed at you follow up appointment. Call Your Doctor If Any of These Occur Redness, drainage, or swelling at the wound.  Temperature greater than 101 degrees. Severe pain not relieved by pain medication. Incision starts to come apart. Follow Up Appt Call today for appointment in 1-2 weeks ([phone]) or for problems.  If you have any hardware placed in your spine, you will need an x-ray before your appointment.

## 2014-11-07 NOTE — Discharge Summary (Signed)
  Physician Discharge Summary  Patient ID: Theodore Frank MRN: [id number] DOB/AGE: September 10, 1936 79 y.o.  Admit date: 11/06/2014 Discharge date: 11/07/2014  Admission Diagnoses: Left L4 radiculopathy from herniated nucleus process L3-4 left  Discharge Diagnoses: Same Active Problems:   HNP (herniated nucleus pulposus), lumbar   Discharged Condition: good  Hospital Course: Patient admitted hospital underwent a left L4-5 laminotomy microdiscectomy postoperative patient did very well with recovered in the floor on the floor he was ambulating and voiding spontaneously tolerating regular diet was stable for discharge home. We will fit him for an AFO and scheduled follow-up in one 2 weeks.  Consults: Significant Diagnostic Studies: Treatments: L4-5 laminectomy microdiscectomy Discharge Exam: Blood pressure 124/73, pulse 80, temperature 99.6 F (37.6 C), temperature source Oral, resp. rate 16, height 6\' 2"  (1.88 m), weight 78.019 kg (172 lb), SpO2 97 %. Complete left footdrop otherwise neurologically intact wound clean dry and intact footdrop is stable from preop  Disposition: Home     Medication List    TAKE these medications        amLODipine 10 MG tablet  Commonly known as:  NORVASC  Take 1 tablet (10 mg total) by mouth daily.     atorvastatin 10 MG tablet  Commonly known as:  LIPITOR  Take 1 tablet (10 mg total) by mouth daily.     cyclobenzaprine 10 MG tablet  Commonly known as:  FLEXERIL  Take 1 tablet (10 mg total) by mouth 3 (three) times daily as needed for muscle spasms.     lisinopril-hydrochlorothiazide 20-12.5 MG per tablet  Commonly known as:  PRINZIDE,ZESTORETIC  Take 1 tablet by mouth daily.     metFORMIN 500 MG tablet  Commonly known as:  GLUCOPHAGE  Take 1 tablet (500 mg total) by mouth 2 (two) times daily with a meal.     naproxen sodium 550 MG tablet  Commonly known as:  ANAPROX DS  Take 1 tablet (550 mg total) by mouth 2 (two) times daily with a meal.     oxycodone 5 MG capsule  Commonly known as:  OXY-IR  Take 5 mg by mouth every 6 (six) hours as needed for pain.     oxyCODONE 5 MG immediate release tablet  Commonly known as:  Oxy IR/ROXICODONE  Take 1 tablet (5 mg total) by mouth every 4 (four) hours as needed for moderate pain.     predniSONE 10 MG tablet  Commonly known as:  DELTASONE  Takes 6 a day for one day 5 a day for one day 4 a day for one day 3 a day for one day 2 a day for one day one a day for one day         Signed: CRAM,GARY P 11/07/2014, 7:42 AM

## 2014-11-16 ENCOUNTER — Ambulatory Visit (INDEPENDENT_AMBULATORY_CARE_PROVIDER_SITE_OTHER): Payer: Medicare Other

## 2014-11-16 ENCOUNTER — Ambulatory Visit (INDEPENDENT_AMBULATORY_CARE_PROVIDER_SITE_OTHER): Payer: Medicare Other | Admitting: Family Medicine

## 2014-11-16 VITALS — BP 122/82 | HR 88 | Temp 97.7°F | Resp 16 | Ht 73.0 in | Wt 171.0 lb

## 2014-11-16 DIAGNOSIS — D649 Anemia, unspecified: Secondary | ICD-10-CM

## 2014-11-16 DIAGNOSIS — R630 Anorexia: Secondary | ICD-10-CM

## 2014-11-16 DIAGNOSIS — R5383 Other fatigue: Secondary | ICD-10-CM

## 2014-11-16 DIAGNOSIS — I10 Essential (primary) hypertension: Secondary | ICD-10-CM

## 2014-11-16 DIAGNOSIS — E119 Type 2 diabetes mellitus without complications: Secondary | ICD-10-CM | POA: Diagnosis not present

## 2014-11-16 DIAGNOSIS — C61 Malignant neoplasm of prostate: Secondary | ICD-10-CM | POA: Diagnosis not present

## 2014-11-16 DIAGNOSIS — R634 Abnormal weight loss: Secondary | ICD-10-CM | POA: Diagnosis not present

## 2014-11-16 LAB — POCT CBC
Granulocyte percent: 76.1 %G (ref 37–80)
HCT, POC: 40 % — AB (ref 43.5–53.7)
Hemoglobin: 12 g/dL — AB (ref 14.1–18.1)
Lymph, poc: 1.7 (ref 0.6–3.4)
MCH, POC: 28.7 pg (ref 27–31.2)
MCHC: 29.9 g/dL — AB (ref 31.8–35.4)
MCV: 96.1 fL (ref 80–97)
MID (cbc): 0.3 (ref 0–0.9)
MPV: 7.2 fL (ref 0–99.8)
POC Granulocyte: 6.3 (ref 2–6.9)
POC LYMPH PERCENT: 20 %L (ref 10–50)
POC MID %: 3.9 %M (ref 0–12)
Platelet Count, POC: 293 10*3/uL (ref 142–424)
RBC: 4.17 M/uL — AB (ref 4.69–6.13)
RDW, POC: 16.5 %
WBC: 8.3 10*3/uL (ref 4.6–10.2)

## 2014-11-16 LAB — POCT URINALYSIS DIPSTICK
Bilirubin, UA: NEGATIVE
Glucose, UA: 250
Ketones, UA: NEGATIVE
Nitrite, UA: NEGATIVE
Spec Grav, UA: 1.015
Urobilinogen, UA: 0.2
pH, UA: 7

## 2014-11-16 LAB — COMPLETE METABOLIC PANEL WITH GFR
ALT: 11 U/L (ref 0–53)
AST: 13 U/L (ref 0–37)
Albumin: 3.6 g/dL (ref 3.5–5.2)
Alkaline Phosphatase: 49 U/L (ref 39–117)
BUN: 12 mg/dL (ref 6–23)
CO2: 27 mEq/L (ref 19–32)
Calcium: 10.6 mg/dL — ABNORMAL HIGH (ref 8.4–10.5)
Chloride: 101 mEq/L (ref 96–112)
Creat: 1.26 mg/dL (ref 0.50–1.35)
GFR, Est African American: 64 mL/min
GFR, Est Non African American: 55 mL/min — ABNORMAL LOW
Glucose, Bld: 271 mg/dL — ABNORMAL HIGH (ref 70–99)
Potassium: 4.1 mEq/L (ref 3.5–5.3)
Sodium: 137 mEq/L (ref 135–145)
Total Bilirubin: 0.8 mg/dL (ref 0.2–1.2)
Total Protein: 7.2 g/dL (ref 6.0–8.3)

## 2014-11-16 LAB — POCT UA - MICROSCOPIC ONLY
Bacteria, U Microscopic: NEGATIVE
Casts, Ur, LPF, POC: NEGATIVE
Crystals, Ur, HPF, POC: NEGATIVE
Mucus, UA: NEGATIVE
Yeast, UA: NEGATIVE

## 2014-11-16 LAB — POCT GLYCOSYLATED HEMOGLOBIN (HGB A1C): Hemoglobin A1C: 9.2

## 2014-11-16 LAB — GLUCOSE, POCT (MANUAL RESULT ENTRY): POC Glucose: 287 mg/dl — AB (ref 70–99)

## 2014-11-16 NOTE — Patient Instructions (Signed)
Take the metformin twice daily 2 pills at breakfast and 2 pills at supper  Try to avoid eating too much bread, pasta, potatoes, and grain type food.  Try to take a walk or get some daily exercise  Monitor your blood sugar and keep a record, usually checking it when fasting or 2 hours after you may medial  Return in 2-3 weeks for a follow-up visit, sooner if worse  I will let you know the results of the remainder of your labs.

## 2014-11-16 NOTE — Progress Notes (Signed)
Subjective: 79 year old man who has been here a number of times in the past. Was last here in December. He had a disc operated on in January, and has recovered well from that. He says he has not had an appetite for the last month, just feels like he cannot eat anything. He complains of his mouth being dry and his uvula dangling down. No other major complaints. He just doesn't have an appetite and has to force himself to eat. He does not have any headache or dizziness. No ear problems. No throat problems other than the uvula. No cough or shortness of breath. No nausea, vomiting. He does have some constipation. This morning however his bowels move 3 times, the third time being on the loose side. That's not usual for him. He has no urinary symptoms, having had prostate surgery 14 years ago for cancer.  Objective: Pleasant alert gentleman. Documented weight loss is noted. He has gone from 40 late December down to 171 today. This represents 16 pounds weight loss unexplained. His TMs are normal. Throat clear except for his uvula is a little edematous and long. His neck supple without significant nodes. Chest clear to auscultation. Heart regular without murmurs. Abdomen soft without mass or tenderness. Has a long low abdominal midline scar from his old prostate surgery.  Assessment: Anorexia Fatigue Weight loss History of diabetes History of hypertension Plan: Labs and chest x-ray    Recent disc surgery  Plan: Check labs and a chest x-ray on him.  Results for orders placed or performed in visit on 11/16/14  POCT CBC  Result Value Ref Range   WBC 8.3 4.6 - 10.2 K/uL   Lymph, poc 1.7 0.6 - 3.4   POC LYMPH PERCENT 20.0 10 - 50 %L   MID (cbc) 0.3 0 - 0.9   POC MID % 3.9 0 - 12 %M   POC Granulocyte 6.3 2 - 6.9   Granulocyte percent 76.1 37 - 80 %G   RBC 4.17 (A) 4.69 - 6.13 M/uL   Hemoglobin 12.0 (A) 14.1 - 18.1 g/dL   HCT, POC 40.0 (A) 43.5 - 53.7 %   MCV 96.1 80 - 97 fL   MCH, POC 28.7 27  - 31.2 pg   MCHC 29.9 (A) 31.8 - 35.4 g/dL   RDW, POC 16.5 %   Platelet Count, POC 293 142 - 424 K/uL   MPV 7.2 0 - 99.8 fL  POCT glucose (manual entry)  Result Value Ref Range   POC Glucose 287 (A) 70 - 99 mg/dl  POCT glycosylated hemoglobin (Hb A1C)  Result Value Ref Range   Hemoglobin A1C 9.2   POCT urinalysis dipstick  Result Value Ref Range   Color, UA yellow    Clarity, UA clear    Glucose, UA 250    Bilirubin, UA neg    Ketones, UA neg    Spec Grav, UA 1.015    Blood, UA tr-intact    pH, UA 7.0    Protein, UA trace    Urobilinogen, UA 0.2    Nitrite, UA neg    Leukocytes, UA Trace   POCT UA - Microscopic Only  Result Value Ref Range   WBC, Ur, HPF, POC 0-2    RBC, urine, microscopic 0-3    Bacteria, U Microscopic neg    Mucus, UA neg    Epithelial cells, urine per micros 0-1    Crystals, Ur, HPF, POC neg    Casts, Ur, LPF, POC neg  Yeast, UA neg    [facility] reading (PRIMARY) by  Dr. Linna Darner Chest x-ray is normal. There appeared to be a little nodule on the left lower lung area, and repeat was taken with nipple marker. This appears to not be anything. We will see what the radiologist says.  Is my impression that the poorly controlled diabetes is probably the cause of his weight loss.Marland Kitchen  His symptoms appear to be from the poorly controlled diabetes.

## 2014-11-17 ENCOUNTER — Encounter: Payer: Self-pay | Admitting: Family Medicine

## 2014-11-21 LAB — POC HEMOCCULT BLD/STL (OFFICE/1-CARD/DIAGNOSTIC): Fecal Occult Blood, POC: POSITIVE

## 2014-11-22 ENCOUNTER — Other Ambulatory Visit: Payer: Self-pay | Admitting: Family Medicine

## 2014-11-22 DIAGNOSIS — J929 Pleural plaque without asbestos: Secondary | ICD-10-CM

## 2014-11-24 ENCOUNTER — Telehealth: Payer: Self-pay

## 2014-11-24 DIAGNOSIS — E785 Hyperlipidemia, unspecified: Secondary | ICD-10-CM

## 2014-11-24 MED ORDER — AMLODIPINE BESYLATE 10 MG PO TABS
10.0000 mg | ORAL_TABLET | Freq: Every day | ORAL | Status: DC
Start: 2014-11-24 — End: 2015-06-27

## 2014-11-24 MED ORDER — ATORVASTATIN CALCIUM 10 MG PO TABS: 10.0000 mg | ORAL_TABLET | Freq: Every day | ORAL | Status: DC

## 2014-11-24 MED ORDER — LISINOPRIL-HYDROCHLOROTHIAZIDE 20-12.5 MG PO TABS: 1.0000 | ORAL_TABLET | Freq: Every day | ORAL | Status: DC

## 2014-11-24 MED ORDER — METFORMIN HCL 500 MG PO TABS: 1000.0000 mg | ORAL_TABLET | Freq: Two times a day (BID) | ORAL | Status: DC

## 2014-11-24 NOTE — Telephone Encounter (Signed)
Pt also asked that his other chronic meds be sent in since they are due for renewal. Done and updated metformin instructions and # per Dr Clayborn Heron instr's to pt.

## 2014-11-24 NOTE — Telephone Encounter (Signed)
Patient states that he (not his pharmacy) faxed a refill request for Amlodipine. Patient states that he sent it to [phone] a few days ago and has not heard anything. Patient states that he is re-faxing it to [phone].   [phone]

## 2014-11-30 ENCOUNTER — Ambulatory Visit (INDEPENDENT_AMBULATORY_CARE_PROVIDER_SITE_OTHER): Payer: Medicare Other

## 2014-11-30 ENCOUNTER — Ambulatory Visit (INDEPENDENT_AMBULATORY_CARE_PROVIDER_SITE_OTHER): Payer: Medicare Other | Admitting: Emergency Medicine

## 2014-11-30 ENCOUNTER — Other Ambulatory Visit: Payer: Self-pay | Admitting: Radiology

## 2014-11-30 VITALS — BP 132/80 | HR 78 | Temp 98.1°F | Resp 18 | Ht 73.0 in | Wt 173.0 lb

## 2014-11-30 DIAGNOSIS — I10 Essential (primary) hypertension: Secondary | ICD-10-CM | POA: Diagnosis not present

## 2014-11-30 DIAGNOSIS — J941 Fibrothorax: Secondary | ICD-10-CM

## 2014-11-30 DIAGNOSIS — R630 Anorexia: Secondary | ICD-10-CM

## 2014-11-30 DIAGNOSIS — E119 Type 2 diabetes mellitus without complications: Secondary | ICD-10-CM

## 2014-11-30 DIAGNOSIS — D500 Iron deficiency anemia secondary to blood loss (chronic): Secondary | ICD-10-CM

## 2014-11-30 DIAGNOSIS — R195 Other fecal abnormalities: Secondary | ICD-10-CM

## 2014-11-30 DIAGNOSIS — R634 Abnormal weight loss: Secondary | ICD-10-CM

## 2014-11-30 DIAGNOSIS — J929 Pleural plaque without asbestos: Secondary | ICD-10-CM

## 2014-11-30 LAB — POCT CBC
Granulocyte percent: 66.1 %G (ref 37–80)
HCT, POC: 35 % — AB (ref 43.5–53.7)
Hemoglobin: 10.4 g/dL — AB (ref 14.1–18.1)
Lymph, poc: 1.7 (ref 0.6–3.4)
MCH, POC: 28.8 pg (ref 27–31.2)
MCHC: 29.7 g/dL — AB (ref 31.8–35.4)
MCV: 96.9 fL (ref 80–97)
MID (cbc): 0.3 (ref 0–0.9)
MPV: 6.9 fL (ref 0–99.8)
POC Granulocyte: 3.9 (ref 2–6.9)
POC LYMPH PERCENT: 28.7 %L (ref 10–50)
POC MID %: 5.2 %M (ref 0–12)
Platelet Count, POC: 231 10*3/uL (ref 142–424)
RBC: 3.61 M/uL — AB (ref 4.69–6.13)
RDW, POC: 16.8 %
WBC: 5.9 10*3/uL (ref 4.6–10.2)

## 2014-11-30 LAB — POCT GLYCOSYLATED HEMOGLOBIN (HGB A1C): Hemoglobin A1C: 7.8

## 2014-11-30 LAB — GLUCOSE, POCT (MANUAL RESULT ENTRY): POC Glucose: 166 mg/dl — AB (ref 70–99)

## 2014-11-30 MED ORDER — GLIMEPIRIDE 4 MG PO TABS: ORAL_TABLET | ORAL | Status: DC

## 2014-11-30 NOTE — Addendum Note (Signed)
Addended by: Ebony Hail on: 11/30/2014 09:52 AM   Modules accepted: Orders

## 2014-11-30 NOTE — Progress Notes (Addendum)
[facility] [address] [place] [address] [phone]  Date:  11/30/2014   Name:  Theodore Frank   DOB:  Sep 12, 1936   MRN:  [id number]  PCP:  [name], MD    Chief Complaint: Follow-up; Fatigue; and rx refills   History of Present Illness:  Theodore Frank is a 79 y.o. very pleasant male patient who presents with the following:  Patient has been seen this month with concerns about weight loss and fatigue.  Last visit, his sugar was poorly controlled Has actually gained two pounds Has been out of amlodipine and his blood pressure is doing well off the med. No nausea or vomiting.  No stool change.  The patient has no complaint of blood, mucous, or pus in her stools. No fever or chills, no cough or coryza. Denies other complaint or health concern today.   Patient Active Problem List   Diagnosis Date Noted  . HNP (herniated nucleus pulposus), lumbar 11/06/2014  . Malignant neoplasm of prostate 04/05/2013  . S/P right inguinal hernia repair 04/02/2012  . Right inguinal hernia 02/25/2012  . Hypertension 01/30/2012  . Hyperthyroidism 01/30/2012  . Diabetes mellitus type 2, controlled, without complications 17/40/8144  . Hyperlipidemia 01/30/2012    Past Medical History  Diagnosis Date  . Diabetes mellitus   . Hyperlipidemia   . Hypertension   . Cancer     colon  . Prostate cancer     radiation  . Shortness of breath dyspnea     with exertion  . Arthritis     Past Surgical History  Procedure Laterality Date  . Colon surgery  2001    colon resection  . Inguinal hernia repair  03/05/2012    Procedure: HERNIA REPAIR INGUINAL ADULT;  Surgeon: [name], MD;  Location: [facility];  Service: General;  Laterality: Right;  Repair right inguinal hernia with mesh  . Prostate surgery    . Hernia repair Right   . Lumbar laminectomy/decompression microdiscectomy Left 11/06/2014    Procedure: LEFT LUMBAR THREE-FOUR  LAMINECTOMY/DECOMPRESSION MICRODISCECTOMY 1 LEVEL;  Surgeon: [name], MD;  Location: [facility];  Service: Neurosurgery;  Laterality: Left;    History  Substance Use Topics  . Smoking status: Former Smoker    Quit date: 10/13/1965  . Smokeless tobacco: Not on file  . Alcohol Use: No    History reviewed. No pertinent family history.  No Known Allergies  Medication list has been reviewed and updated.  Current Outpatient Prescriptions on File Prior to Visit  Medication Sig Dispense Refill  . amLODipine (NORVASC) 10 MG tablet Take 1 tablet (10 mg total) by mouth daily. 90 tablet 1  . atorvastatin (LIPITOR) 10 MG tablet Take 1 tablet (10 mg total) by mouth daily. 90 tablet 1  . cyclobenzaprine (FLEXERIL) 10 MG tablet Take 1 tablet (10 mg total) by mouth 3 (three) times daily as needed for muscle spasms. 30 tablet 0  . lisinopril-hydrochlorothiazide (PRINZIDE,ZESTORETIC) 20-12.5 MG per tablet Take 1 tablet by mouth daily. 90 tablet 1  . metFORMIN (GLUCOPHAGE) 500 MG tablet Take 2 tablets (1,000 mg total) by mouth 2 (two) times daily with a meal. 360 tablet 1  . naproxen sodium (ANAPROX DS) 550 MG tablet Take 1 tablet (550 mg total) by mouth 2 (two) times daily with a meal. 40 tablet 0   No current facility-administered medications on file prior to visit.    Review of Systems:  As per HPI, otherwise  negative.    Physical Examination: Filed Vitals:   11/30/14 0825  BP: 132/80  Pulse: 78  Temp: 98.1 F (36.7 C)  Resp: 18   Filed Vitals:   11/30/14 0825  Height: 6\' 1"  (1.854 m)  Weight: 173 lb (78.472 kg)   Body mass index is 22.83 kg/(m^2). Ideal Body Weight: Weight in (lb) to have BMI = 25: 189.1  GEN: WDWN, NAD, Non-toxic, A & O x 3 HEENT: Atraumatic, Normocephalic. Neck supple. No masses, No LAD. Ears and Nose: No external deformity. CV: RRR, No M/G/R. No JVD. No thrill. No extra heart sounds. PULM: CTA B, no wheezes, crackles, rhonchi. No retractions. No resp.  distress. No accessory muscle use. ABD: S, NT, ND, +BS. No rebound. No HSM. EXTR: No c/c/e NEURO Normal gait.  PSYCH: Normally interactive. Conversant. Not depressed or anxious appearing.  Calm demeanor.    Assessment and Plan: Heme positive stools Anemia Weight loss NIDDM GI consult  Signed,  Ellison Carwin, MD   Results for orders placed or performed in visit on 11/30/14  POCT glucose (manual entry)  Result Value Ref Range   POC Glucose 166 (A) 70 - 99 mg/dl  POCT glycosylated hemoglobin (Hb A1C)  Result Value Ref Range   Hemoglobin A1C 7.8   POCT CBC  Result Value Ref Range   WBC 5.9 4.6 - 10.2 K/uL   Lymph, poc 1.7 0.6 - 3.4   POC LYMPH PERCENT 28.7 10 - 50 %L   MID (cbc) 0.3 0 - 0.9   POC MID % 5.2 0 - 12 %M   POC Granulocyte 3.9 2 - 6.9   Granulocyte percent 66.1 37 - 80 %G   RBC 3.61 (A) 4.69 - 6.13 M/uL   Hemoglobin 10.4 (A) 14.1 - 18.1 g/dL   HCT, POC 35.0 (A) 43.5 - 53.7 %   MCV 96.9 80 - 97 fL   MCH, POC 28.8 27 - 31.2 pg   MCHC 29.7 (A) 31.8 - 35.4 g/dL   RDW, POC 16.8 %   Platelet Count, POC 231 142 - 424 K/uL   MPV 6.9 0 - 99.8 fL    [facility] reading (PRIMARY) by  Dr. Ouida Sills.  No change.

## 2014-11-30 NOTE — Addendum Note (Signed)
Addended by: Roselee Culver on: 11/30/2014 10:28 AM   Modules accepted: Orders

## 2014-11-30 NOTE — Patient Instructions (Signed)
Fecal Occult Blood Test This is a test done on a stool specimen to screen for gastrointestinal bleeding, which may be an indicator of colon cancer Is is usually done as part of a routine examination, annually, after age 77 or as directed by your caregiver. The fecal occult blood test (FOBT) checks for blood in your stool. Normally, there will not be enough blood lost through the gastrointestinal tract to turn an FOBT positive or for you to notice it visually in the form of bloody or dark, tarry stools. Any significant amount of blood being passed should be investigated.  A positive FOBT will tell your caregiver that you have bleeding occurring somewhere in your gastrointestinal tract. This blood loss could be due to ulcers, diverticulosis, bleeding polyps, inflammatory bowel disease, hemorrhoids, from swallowed blood due to bleeding gums or nosebleeds, or it could be due to benign or cancerous tumors. Anything that protrudes into the lumen (the empty space in the intestine), like a polyp or tumor, and is rubbed against by the fecal waste as it passes through has the potential to eventually bleed intermittently. Often this small amount of blood is the first, and sometimes the only, symptom of early colon cancer, making the FOBT a valuable screening tool. PREPARATION FOR TEST  You should not eat red meat within three days before testing. Other substances that could cause a false positive test result include fish, turnips, horseradish, and drugs such as colchicines and oxidizing drugs (for example, iodine and boric acid). Be sure to carefully follow your caregiver's instructions. With FOBT, your caregiver or laboratory will give you one or more test "cards." You collect a separate sample from three different stools, usually on consecutive days. Each stool sample should be collected into a clean container and should not be contaminated with urine or water. The slide is labeled with your name and the date; then,  with an applicator stick, you apply a thin smear of stool onto each filter paper square/window contained on the card. Allow the filter paper to dry. Once it is dry, it is stable. Usually you will collect all of the consecutive samples, and then return all of them to your caregiver or laboratory at the same time, sometimes by mailing them. There are also over the counter tests which are dropped in your toilet. NORMAL FINDINGS   No occult blood within the stool.  The FOBT test is normally negative. A positive indicates either blood in the stool or an interfering substance. Multiple samples are done to: 1) catch intermittent bleeding; and 2) help rule out false positives. Ranges for normal findings may vary among different laboratories and hospitals. You should always check with your doctor after having lab work or other tests done to discuss the meaning of your test results and whether your values are considered within normal limits. MEANING OF TEST  Your caregiver will go over the test results with you and discuss the importance and meaning of your results, as well as treatment options and the need for additional tests if necessary. OBTAINING THE TEST RESULTS  It is your responsibility to obtain your test results. Ask the lab or department performing the test when and how you will get your results. Document Released: 10/24/2004 Document Revised: 12/22/2011 Document Reviewed: 09/08/2008 [facility] Patient Information 2015 [place], [place]. This information is not intended to replace advice given to you by your health care provider. Make sure you discuss any questions you have with your health care provider.

## 2014-12-01 ENCOUNTER — Other Ambulatory Visit: Payer: Self-pay

## 2014-12-01 MED ORDER — GLIMEPIRIDE 4 MG PO TABS: ORAL_TABLET | ORAL | Status: DC

## 2014-12-18 ENCOUNTER — Ambulatory Visit: Payer: Medicare Other

## 2014-12-18 ENCOUNTER — Ambulatory Visit (INDEPENDENT_AMBULATORY_CARE_PROVIDER_SITE_OTHER): Payer: Medicare Other | Admitting: Emergency Medicine

## 2014-12-18 ENCOUNTER — Ambulatory Visit (INDEPENDENT_AMBULATORY_CARE_PROVIDER_SITE_OTHER): Payer: Medicare Other

## 2014-12-18 VITALS — BP 124/84 | HR 72 | Temp 97.9°F | Resp 16 | Ht 73.0 in | Wt 177.2 lb

## 2014-12-18 DIAGNOSIS — E119 Type 2 diabetes mellitus without complications: Secondary | ICD-10-CM

## 2014-12-18 DIAGNOSIS — R918 Other nonspecific abnormal finding of lung field: Secondary | ICD-10-CM

## 2014-12-18 DIAGNOSIS — R195 Other fecal abnormalities: Secondary | ICD-10-CM | POA: Diagnosis not present

## 2014-12-18 DIAGNOSIS — D500 Iron deficiency anemia secondary to blood loss (chronic): Secondary | ICD-10-CM | POA: Diagnosis not present

## 2014-12-18 DIAGNOSIS — I10 Essential (primary) hypertension: Secondary | ICD-10-CM | POA: Diagnosis not present

## 2014-12-18 DIAGNOSIS — R634 Abnormal weight loss: Secondary | ICD-10-CM | POA: Diagnosis not present

## 2014-12-18 NOTE — Patient Instructions (Signed)
Iron Deficiency Anemia Anemia is a condition in which there are less red blood cells or hemoglobin in the blood than normal. Hemoglobin is the part of red blood cells that carries oxygen. Iron deficiency anemia is anemia caused by too little iron. It is the most common type of anemia. It may leave you tired and short of breath. CAUSES   Lack of iron in the diet.  Poor absorption of iron, as seen with intestinal disorders.  Intestinal bleeding.  Heavy periods. SIGNS AND SYMPTOMS  Mild anemia may not be noticeable. Symptoms may include:  Fatigue.  Headache.  Pale skin.  Weakness.  Tiredness.  Shortness of breath.  Dizziness.  Cold hands and feet.  Fast or irregular heartbeat. DIAGNOSIS  Diagnosis requires a thorough evaluation and physical exam by your health care provider. Blood tests are generally used to confirm iron deficiency anemia. Additional tests may be done to find the underlying cause of your anemia. These may include:  Testing for blood in the stool (fecal occult blood test).  A procedure to see inside the colon and rectum (colonoscopy).  A procedure to see inside the esophagus and stomach (endoscopy). TREATMENT  Iron deficiency anemia is treated by correcting the cause of the deficiency. Treatment may involve:  Adding iron-rich foods to your diet.  Taking iron supplements. Pregnant or breastfeeding women need to take extra iron because their normal diet usually does not provide the required amount.  Taking vitamins. Vitamin C improves the absorption of iron. Your health care provider may recommend that you take your iron tablets with a glass of orange juice or vitamin C supplement.  Medicines to make heavy menstrual flow lighter.  Surgery. HOME CARE INSTRUCTIONS   Take iron as directed by your health care provider.  If you cannot tolerate taking iron supplements by mouth, talk to your health care provider about taking them through a vein  (intravenously) or an injection into a muscle.  For the best iron absorption, iron supplements should be taken on an empty stomach. If you cannot tolerate them on an empty stomach, you may need to take them with food.  Do not drink milk or take antacids at the same time as your iron supplements. Milk and antacids may interfere with the absorption of iron.  Iron supplements can cause constipation. Make sure to include fiber in your diet to prevent constipation. A stool softener may also be recommended.  Take vitamins as directed by your health care provider.  Eat a diet rich in iron. Foods high in iron include liver, lean beef, whole-grain bread, eggs, dried fruit, and dark green leafy vegetables. SEEK IMMEDIATE MEDICAL CARE IF:   You faint. If this happens, do not drive. Call your local emergency services (911 in [place] if no other help is available.  You have chest pain.  You feel nauseous or vomit.  You have severe or increased shortness of breath with activity.  You feel weak.  You have a rapid heartbeat.  You have unexplained sweating.  You become light-headed when getting up from a chair or bed. MAKE SURE YOU:   Understand these instructions.  Will watch your condition.  Will get help right away if you are not doing well or get worse. Document Released: 09/26/2000 Document Revised: 10/04/2013 Document Reviewed: 06/06/2013 [employer] Patient Information 2015 [employer]. This information is not intended to replace advice given to you by your health care provider. Make sure you discuss any questions you have with your health care provider.  

## 2014-12-18 NOTE — Progress Notes (Signed)
[facility] [address], [place] [address] [phone]  Date:  12/18/2014   Name:  Theodore Frank   DOB:  09-Oct-1936   MRN:  [id number]  PCP:  [name], MD    Chief Complaint: Follow-up   History of Present Illness:  Theodore Frank is a 79 y.o. very pleasant male patient who presents with the following:  Patient was seen previously for heme positive stools, anemia and weight loss.  Referred to GI and that was not accomplished as there was an apparent miscomunication between patient and GI office He has been seen previously by Dr Collene Mares and requests a referral there Had a chest xray and prominent nipple shadows and a repeat xray was suggested. Says he is better with medication since previous visit. No improvement with over the counter medications or other home remedies. Denies other complaint or health concern today.   Patient Active Problem List   Diagnosis Date Noted  . HNP (herniated nucleus pulposus), lumbar 11/06/2014  . Malignant neoplasm of prostate 04/05/2013  . S/P right inguinal hernia repair 04/02/2012  . Right inguinal hernia 02/25/2012  . Hypertension 01/30/2012  . Hyperthyroidism 01/30/2012  . Diabetes mellitus type 2, controlled, without complications 43/32/9518  . Hyperlipidemia 01/30/2012    Past Medical History  Diagnosis Date  . Diabetes mellitus   . Hyperlipidemia   . Hypertension   . Cancer     colon  . Prostate cancer     radiation  . Shortness of breath dyspnea     with exertion  . Arthritis     Past Surgical History  Procedure Laterality Date  . Colon surgery  2001    colon resection  . Inguinal hernia repair  03/05/2012    Procedure: HERNIA REPAIR INGUINAL ADULT;  Surgeon: [name], MD;  Location: [facility];  Service: General;  Laterality: Right;  Repair right inguinal hernia with mesh  . Prostate surgery    . Hernia repair Right   . Lumbar laminectomy/decompression microdiscectomy  Left 11/06/2014    Procedure: LEFT LUMBAR THREE-FOUR LAMINECTOMY/DECOMPRESSION MICRODISCECTOMY 1 LEVEL;  Surgeon: [name], MD;  Location: [facility];  Service: Neurosurgery;  Laterality: Left;    History  Substance Use Topics  . Smoking status: Former Smoker    Quit date: 10/13/1965  . Smokeless tobacco: Not on file  . Alcohol Use: No    History reviewed. No pertinent family history.  No Known Allergies  Medication list has been reviewed and updated.  Current Outpatient Prescriptions on File Prior to Visit  Medication Sig Dispense Refill  . amLODipine (NORVASC) 10 MG tablet Take 1 tablet (10 mg total) by mouth daily. 90 tablet 1  . atorvastatin (LIPITOR) 10 MG tablet Take 1 tablet (10 mg total) by mouth daily. 90 tablet 1  . cyclobenzaprine (FLEXERIL) 10 MG tablet Take 1 tablet (10 mg total) by mouth 3 (three) times daily as needed for muscle spasms. 30 tablet 0  . glimepiride (AMARYL) 4 MG tablet 1 with breakfast for a week then increase to 2 60 tablet 2  . lisinopril-hydrochlorothiazide (PRINZIDE,ZESTORETIC) 20-12.5 MG per tablet Take 1 tablet by mouth daily. 90 tablet 1  . metFORMIN (GLUCOPHAGE) 500 MG tablet Take 2 tablets (1,000 mg total) by mouth 2 (two) times daily with a meal. 360 tablet 1  . naproxen sodium (ANAPROX DS) 550 MG tablet Take 1 tablet (550 mg total) by mouth 2 (two) times daily with a meal. 40  tablet 0   No current facility-administered medications on file prior to visit.    Review of Systems:  As per HPI, otherwise negative.    Physical Examination: Filed Vitals:   12/18/14 0946  BP: 124/84  Pulse: 72  Temp: 97.9 F (36.6 C)  Resp: 16   Filed Vitals:   12/18/14 0946  Height: 6\' 1"  (1.854 m)  Weight: 177 lb 3.2 oz (80.377 kg)   Body mass index is 23.38 kg/(m^2). Ideal Body Weight: Weight in (lb) to have BMI = 25: 189.1   GEN: WDWN, NAD, Non-toxic, Alert & Oriented x 3 HEENT: Atraumatic, Normocephalic.  Ears and Nose: No external  deformity. EXTR: No clubbing/cyanosis/edema NEURO: Normal gait.  PSYCH: Normally interactive. Conversant. Not depressed or anxious appearing.  Calm demeanor.    Assessment and Plan: Nipple shadows Re-sent GI consultation Signed,  Ellison Carwin, MD    [facility] reading (PRIMARY) by  Dr. Ouida Sills.  Nipple shadow.

## 2014-12-26 DIAGNOSIS — R195 Other fecal abnormalities: Secondary | ICD-10-CM | POA: Diagnosis not present

## 2014-12-26 DIAGNOSIS — Z1211 Encounter for screening for malignant neoplasm of colon: Secondary | ICD-10-CM | POA: Diagnosis not present

## 2014-12-26 DIAGNOSIS — Z86010 Personal history of colonic polyps: Secondary | ICD-10-CM | POA: Diagnosis not present

## 2014-12-26 DIAGNOSIS — Z85038 Personal history of other malignant neoplasm of large intestine: Secondary | ICD-10-CM | POA: Diagnosis not present

## 2014-12-26 DIAGNOSIS — K5730 Diverticulosis of large intestine without perforation or abscess without bleeding: Secondary | ICD-10-CM | POA: Diagnosis not present

## 2014-12-26 DIAGNOSIS — D509 Iron deficiency anemia, unspecified: Secondary | ICD-10-CM | POA: Diagnosis not present

## 2014-12-27 ENCOUNTER — Telehealth: Payer: Self-pay

## 2014-12-27 DIAGNOSIS — R195 Other fecal abnormalities: Secondary | ICD-10-CM | POA: Diagnosis not present

## 2014-12-27 DIAGNOSIS — Z85038 Personal history of other malignant neoplasm of large intestine: Secondary | ICD-10-CM | POA: Diagnosis not present

## 2014-12-27 DIAGNOSIS — Z1211 Encounter for screening for malignant neoplasm of colon: Secondary | ICD-10-CM | POA: Diagnosis not present

## 2014-12-27 DIAGNOSIS — D509 Iron deficiency anemia, unspecified: Secondary | ICD-10-CM | POA: Diagnosis not present

## 2014-12-27 DIAGNOSIS — K635 Polyp of colon: Secondary | ICD-10-CM | POA: Diagnosis not present

## 2014-12-27 LAB — HM COLONOSCOPY

## 2014-12-27 NOTE — Telephone Encounter (Signed)
Dr Ouida Sills, Dr [name] called to consult with you on pt. He had some abnormal labs and wanted to discuss that and other concerns. Please call her back on her cell phone at [phone].

## 2015-01-01 ENCOUNTER — Ambulatory Visit (INDEPENDENT_AMBULATORY_CARE_PROVIDER_SITE_OTHER): Payer: Medicare Other | Admitting: Internal Medicine

## 2015-01-01 VITALS — BP 114/74 | HR 70 | Temp 97.8°F | Resp 16 | Ht 73.0 in | Wt 182.0 lb

## 2015-01-01 DIAGNOSIS — D649 Anemia, unspecified: Secondary | ICD-10-CM

## 2015-01-01 DIAGNOSIS — E118 Type 2 diabetes mellitus with unspecified complications: Secondary | ICD-10-CM | POA: Diagnosis not present

## 2015-01-01 LAB — POCT CBC
Granulocyte percent: 68.5 %G (ref 37–80)
HCT, POC: 38.4 % — AB (ref 43.5–53.7)
Hemoglobin: 11.9 g/dL — AB (ref 14.1–18.1)
Lymph, poc: 1.8 (ref 0.6–3.4)
MCH, POC: 27.4 pg (ref 27–31.2)
MCHC: 31.1 g/dL — AB (ref 31.8–35.4)
MCV: 88.2 fL (ref 80–97)
MID (cbc): 0.4 (ref 0–0.9)
MPV: 7.2 fL (ref 0–99.8)
POC Granulocyte: 4.8 (ref 2–6.9)
POC LYMPH PERCENT: 25.3 %L (ref 10–50)
POC MID %: 6.2 %M (ref 0–12)
Platelet Count, POC: 254 10*3/uL (ref 142–424)
RBC: 4.36 M/uL — AB (ref 4.69–6.13)
RDW, POC: 15 %
WBC: 7 10*3/uL (ref 4.6–10.2)

## 2015-01-01 LAB — GLUCOSE, POCT (MANUAL RESULT ENTRY): POC Glucose: 72 mg/dl (ref 70–99)

## 2015-01-01 NOTE — Patient Instructions (Signed)
Anemia, Nonspecific Anemia is a condition in which the concentration of red blood cells or hemoglobin in the blood is below normal. Hemoglobin is a substance in red blood cells that carries oxygen to the tissues of the body. Anemia results in not enough oxygen reaching these tissues.  CAUSES  Common causes of anemia include:   Excessive bleeding. Bleeding may be internal or external. This includes excessive bleeding from periods (in women) or from the intestine.   Poor nutrition.   Chronic kidney, thyroid, and liver disease.  Bone marrow disorders that decrease red blood cell production.  Cancer and treatments for cancer.  HIV, AIDS, and their treatments.  Spleen problems that increase red blood cell destruction.  Blood disorders.  Excess destruction of red blood cells due to infection, medicines, and autoimmune disorders. SIGNS AND SYMPTOMS   Minor weakness.   Dizziness.   Headache.  Palpitations.   Shortness of breath, especially with exercise.   Paleness.  Cold sensitivity.  Indigestion.  Nausea.  Difficulty sleeping.  Difficulty concentrating. Symptoms may occur suddenly or they may develop slowly.  DIAGNOSIS  Additional blood tests are often needed. These help your health care provider determine the best treatment. Your health care provider will check your stool for blood and look for other causes of blood loss.  TREATMENT  Treatment varies depending on the cause of the anemia. Treatment can include:   Supplements of iron, vitamin B12, or folic acid.   Hormone medicines.   A blood transfusion. This may be needed if blood loss is severe.   Hospitalization. This may be needed if there is significant continual blood loss.   Dietary changes.  Spleen removal. HOME CARE INSTRUCTIONS Keep all follow-up appointments. It often takes many weeks to correct anemia, and having your health care provider check on your condition and your response to  treatment is very important. SEEK IMMEDIATE MEDICAL CARE IF:   You develop extreme weakness, shortness of breath, or chest pain.   You become dizzy or have trouble concentrating.  You develop heavy vaginal bleeding.   You develop a rash.   You have bloody or black, tarry stools.   You faint.   You vomit up blood.   You vomit repeatedly.   You have abdominal pain.  You have a fever or persistent symptoms for more than 2-3 days.   You have a fever and your symptoms suddenly get worse.   You are dehydrated.  MAKE SURE YOU:  Understand these instructions.  Will watch your condition.  Will get help right away if you are not doing well or get worse. Document Released: 11/06/2004 Document Revised: 06/01/2013 Document Reviewed: 03/25/2013 [employer] Patient Information 2015 [employer]. This information is not intended to replace advice given to you by your health care provider. Make sure you discuss any questions you have with your health care provider.  

## 2015-01-01 NOTE — Progress Notes (Signed)
   Subjective:    Patient ID: Theodore Frank, male    DOB: [birth year], 79 y.o.   MRN: [id number]  HPI  Edited by me, dr. Placido Sou by Leroy Kennedy LPn 54 male here for Follow up Weight Loss/ Fatigue  Coloscopy done on December 26, 2014.  [facility] primary sees multiple doctors  States feeling 99% better, gaining weight, no fatigue Pt states he saw Dr. Hoyt Koch doctor/had endoscopies reports pending and he does not know results. 11/06/14 surgery on pinched nerve. On chart review he had new anemia last cbc, a reason why he had endoscopies. Heart steady/ lungs good Normal bowel movements and urinary. Taking B12 and multivitamin  Review of Systems     Objective:   Physical Exam  Constitutional: He is oriented to person, place, and time. He appears well-developed and well-nourished. No distress.  HENT:  Head: Normocephalic.  Right Ear: External ear normal.  Mouth/Throat: Oropharynx is clear and moist.  Eyes: EOM are normal.  Neck: Normal range of motion.  Cardiovascular: Normal rate, regular rhythm, normal heart sounds and intact distal pulses.   Pulmonary/Chest: Effort normal and breath sounds normal.  Abdominal: Soft. There is no tenderness.  Musculoskeletal: Normal range of motion.  Neurological: He is alert and oriented to person, place, and time. He exhibits normal muscle tone. Coordination normal.  Psychiatric: He has a normal mood and affect. His behavior is normal.  Vitals reviewed.  Results for orders placed or performed in visit on 01/01/15  POCT CBC  Result Value Ref Range   WBC 7.0 4.6 - 10.2 K/uL   Lymph, poc 1.8 0.6 - 3.4   POC LYMPH PERCENT 25.3 10 - 50 %L   MID (cbc) 0.4 0 - 0.9   POC MID % 6.2 0 - 12 %M   POC Granulocyte 4.8 2 - 6.9   Granulocyte percent 68.5 37 - 80 %G   RBC 4.36 (A) 4.69 - 6.13 M/uL   Hemoglobin 11.9 (A) 14.1 - 18.1 g/dL   HCT, POC 38.4 (A) 43.5 - 53.7 %   MCV 88.2 80 - 97 fL   MCH, POC 27.4 27 - 31.2 pg   MCHC 31.1 (A) 31.8  - 35.4 g/dL   RDW, POC 15.0 %   Platelet Count, POC 254 142 - 424 K/uL   MPV 7.2 0 - 99.8 fL  POCT glucose (manual entry)  Result Value Ref Range   POC Glucose 72 70 - 99 mg/dl          Assessment & Plan:  Fatigue and weight loss resolved Anemia/improving T2DM controlled now

## 2015-01-11 ENCOUNTER — Telehealth: Payer: Self-pay

## 2015-01-11 DIAGNOSIS — E785 Hyperlipidemia, unspecified: Secondary | ICD-10-CM

## 2015-01-11 DIAGNOSIS — D509 Iron deficiency anemia, unspecified: Secondary | ICD-10-CM | POA: Diagnosis not present

## 2015-01-11 DIAGNOSIS — Z85038 Personal history of other malignant neoplasm of large intestine: Secondary | ICD-10-CM | POA: Diagnosis not present

## 2015-01-11 DIAGNOSIS — K5730 Diverticulosis of large intestine without perforation or abscess without bleeding: Secondary | ICD-10-CM | POA: Diagnosis not present

## 2015-01-11 DIAGNOSIS — Z86010 Personal history of colonic polyps: Secondary | ICD-10-CM | POA: Diagnosis not present

## 2015-01-11 MED ORDER — LISINOPRIL-HYDROCHLOROTHIAZIDE 20-12.5 MG PO TABS: 1.0000 | ORAL_TABLET | Freq: Every day | ORAL | Status: DC

## 2015-01-11 NOTE — Telephone Encounter (Signed)
Done  Rx sent

## 2015-01-11 NOTE — Telephone Encounter (Signed)
Patient would like to have his lisinopril-hydrochlorothiazide (PRINZIDE,ZESTORETIC) 20-12.5 MG per tablet called into [facility] its a company out of [place]. If there are any questions or concerns please call him at [phone].

## 2015-01-15 DIAGNOSIS — D509 Iron deficiency anemia, unspecified: Secondary | ICD-10-CM | POA: Diagnosis not present

## 2015-01-18 ENCOUNTER — Telehealth: Payer: Self-pay

## 2015-01-18 DIAGNOSIS — E785 Hyperlipidemia, unspecified: Secondary | ICD-10-CM

## 2015-01-18 MED ORDER — ATORVASTATIN CALCIUM 10 MG PO TABS: 10.0000 mg | ORAL_TABLET | Freq: Every day | ORAL | Status: DC

## 2015-01-18 NOTE — Telephone Encounter (Signed)
Pt needs a refill on his lipitor.  Needs to be called in to [facility] in  [place].  Pt's number is  [phone]

## 2015-01-18 NOTE — Telephone Encounter (Signed)
Yes 90 day is ok. He should be seen by someone for further refills.

## 2015-01-18 NOTE — Telephone Encounter (Signed)
Can I send in 90 day? Last lipid was 03/2014. Please advise.

## 2015-01-19 NOTE — Telephone Encounter (Signed)
Spoke with pt, advised Rx sent.

## 2015-01-25 NOTE — Telephone Encounter (Signed)
Dr Ouida Sills, I was checking on my open encounters and saw this message. I wasn't sure if you had done this or if still necessary? Please close encounter when done reviewing.

## 2015-01-26 NOTE — Telephone Encounter (Signed)
No thanks  

## 2015-02-13 DIAGNOSIS — D509 Iron deficiency anemia, unspecified: Secondary | ICD-10-CM | POA: Diagnosis not present

## 2015-02-13 DIAGNOSIS — K5730 Diverticulosis of large intestine without perforation or abscess without bleeding: Secondary | ICD-10-CM | POA: Diagnosis not present

## 2015-02-13 DIAGNOSIS — Z85030 Personal history of malignant carcinoid tumor of large intestine: Secondary | ICD-10-CM | POA: Diagnosis not present

## 2015-02-13 DIAGNOSIS — Z86010 Personal history of colonic polyps: Secondary | ICD-10-CM | POA: Diagnosis not present

## 2015-06-27 ENCOUNTER — Ambulatory Visit (INDEPENDENT_AMBULATORY_CARE_PROVIDER_SITE_OTHER): Payer: Medicare Other | Admitting: Physician Assistant

## 2015-06-27 VITALS — BP 142/92 | HR 73 | Temp 98.2°F | Resp 17 | Ht 73.0 in | Wt 183.0 lb

## 2015-06-27 DIAGNOSIS — I10 Essential (primary) hypertension: Secondary | ICD-10-CM

## 2015-06-27 DIAGNOSIS — E785 Hyperlipidemia, unspecified: Secondary | ICD-10-CM

## 2015-06-27 DIAGNOSIS — E119 Type 2 diabetes mellitus without complications: Secondary | ICD-10-CM | POA: Diagnosis not present

## 2015-06-27 DIAGNOSIS — R232 Flushing: Secondary | ICD-10-CM

## 2015-06-27 DIAGNOSIS — R61 Generalized hyperhidrosis: Secondary | ICD-10-CM | POA: Diagnosis not present

## 2015-06-27 DIAGNOSIS — Z23 Encounter for immunization: Secondary | ICD-10-CM

## 2015-06-27 LAB — GLUCOSE, POCT (MANUAL RESULT ENTRY): POC Glucose: 108 mg/dl — AB (ref 70–99)

## 2015-06-27 LAB — POCT GLYCOSYLATED HEMOGLOBIN (HGB A1C): Hemoglobin A1C: 6.1

## 2015-06-27 MED ORDER — GLIMEPIRIDE 4 MG PO TABS: 4.0000 mg | ORAL_TABLET | Freq: Two times a day (BID) | ORAL | Status: DC

## 2015-06-27 MED ORDER — LISINOPRIL-HYDROCHLOROTHIAZIDE 20-12.5 MG PO TABS: 1.0000 | ORAL_TABLET | Freq: Every day | ORAL | Status: DC

## 2015-06-27 MED ORDER — METFORMIN HCL 500 MG PO TABS: 1000.0000 mg | ORAL_TABLET | Freq: Two times a day (BID) | ORAL | Status: DC

## 2015-06-27 MED ORDER — ATORVASTATIN CALCIUM 10 MG PO TABS: 10.0000 mg | ORAL_TABLET | Freq: Every day | ORAL | Status: DC

## 2015-06-27 MED ORDER — AMLODIPINE BESYLATE 10 MG PO TABS: 10.0000 mg | ORAL_TABLET | Freq: Every day | ORAL | Status: DC

## 2015-06-27 NOTE — Progress Notes (Signed)
Subjective:    Patient ID: Theodore Frank, male    DOB: 1936/05/04, 79 y.o.   MRN: [id number]  HPI Patient presents for refill of Norvasc, Lipitor, glimepiride, Prinzide, and Metformin. States that he has been compliant with medication and denies side effects. Has not checked glucose at home in a few weeks and not sure what it was at that time. 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 Does not have any physical activity outside of that, however, plans on going to the gym as one of his friends has invited him to go. States that diet is pretty healthy as he mostly cooks and does not eat much fast food. Gets plenty of vegetables and is does not eat fried food or pork. Drinks mostly water with the occasional ginger ale. Denies SOB, CP, edema, HA/dizziness, or N/V. NKDA.  Patient request testosterone be draw for isolated episode of hot flashes with sweating 2 weeks ago. Not associated with any mood changes or weakness. States that he stays hydrated and was not ill at the time. Review of Systems  Constitutional: Positive for diaphoresis (resolved). Negative for fever, chills, appetite change and fatigue.  Eyes: Negative for visual disturbance.  Respiratory: Negative for cough and shortness of breath.   Cardiovascular: Negative for chest pain, palpitations and leg swelling.  Gastrointestinal: Negative for nausea and vomiting.  Neurological: Negative for dizziness, weakness, numbness and headaches.       Objective:   Physical Exam  Constitutional: He is oriented to person, place, and time. He appears well-developed and well-nourished. No distress.  Blood pressure 142/92, pulse 73, temperature 98.2 F (36.8 C), temperature source Oral, resp. rate 17, height 6\' 1"  (1.854 m), weight 183 lb (83.008 kg), SpO2 98 %.  HENT:  Head: Normocephalic and atraumatic.  Right Ear: External ear normal.  Left Ear: External ear normal.  Eyes: Conjunctivae are normal. Pupils are equal,  round, and reactive to light. Right eye exhibits no discharge. Left eye exhibits no discharge. No scleral icterus.  Neck: Neck supple. No JVD present. Carotid bruit is not present. No thyromegaly present.  Cardiovascular: Normal rate, regular rhythm, normal heart sounds and intact distal pulses.  Exam reveals no gallop and no friction rub.   No murmur heard. Pulmonary/Chest: Effort normal and breath sounds normal. No respiratory distress. He has no wheezes. He has no rales.  Lymphadenopathy:    He has no cervical adenopathy.  Neurological: He is alert and oriented to person, place, and time.  Skin: Skin is warm and dry. No rash noted. He is not diaphoretic. No erythema. No pallor.  Psychiatric: He has a normal mood and affect. His behavior is normal. Judgment and thought content normal.   Results for orders placed or performed in visit on 06/27/15  POCT glucose (manual entry)  Result Value Ref Range   POC Glucose 108 (A) 70 - 99 mg/dl  POCT glycosylated hemoglobin (Hb A1C)  Result Value Ref Range   Hemoglobin A1C 6.1        Assessment & Plan:  1. Diabetes mellitus type 2, controlled, without complications Lifestyle modifications discussed.  - POCT glucose (manual entry) - POCT glycosylated hemoglobin (Hb A1C) - glimepiride (AMARYL) 4 MG tablet; Take 1 tablet (4 mg total) by mouth 2 (two) times daily with a meal.  Dispense: 60 tablet; Refill: 3 - metFORMIN (GLUCOPHAGE) 500 MG tablet; Take 2 tablets (1,000 mg total) by mouth 2 (two) times daily with a meal.  Dispense:  360 tablet; Refill: 1  2. Essential hypertension - Comprehensive metabolic panel - amLODipine (NORVASC) 10 MG tablet; Take 1 tablet (10 mg total) by mouth daily.  Dispense: 90 tablet; Refill: 1 - lisinopril-hydrochlorothiazide (PRINZIDE,ZESTORETIC) 20-12.5 MG per tablet; Take 1 tablet by mouth daily.  Dispense: 90 tablet; Refill: 1  3. Hyperlipidemia - Lipid panel - atorvastatin (LIPITOR) 10 MG tablet; Take 1 tablet  (10 mg total) by mouth daily.  Dispense: 90 tablet; Refill: 1  4. Need for prophylactic vaccination and inoculation against influenza - Flu Vaccine QUAD 36+ mos IM  5. Hot flashes Isolated episode with no other sx. Should RTC for further eval if occurs again. - Testosterone   Tishira Brewington PA-C  [facility] 06/27/2015 12:49 PM

## 2015-06-27 NOTE — Patient Instructions (Signed)

## 2015-06-28 ENCOUNTER — Encounter: Payer: Self-pay | Admitting: Physician Assistant

## 2015-06-28 LAB — COMPREHENSIVE METABOLIC PANEL
ALT: 11 U/L (ref 9–46)
AST: 13 U/L (ref 10–35)
Albumin: 4 g/dL (ref 3.6–5.1)
Alkaline Phosphatase: 51 U/L (ref 40–115)
BUN: 23 mg/dL (ref 7–25)
CO2: 25 mmol/L (ref 20–31)
Calcium: 10.5 mg/dL — ABNORMAL HIGH (ref 8.6–10.3)
Chloride: 107 mmol/L (ref 98–110)
Creat: 1.16 mg/dL (ref 0.70–1.18)
Glucose, Bld: 105 mg/dL — ABNORMAL HIGH (ref 65–99)
Potassium: 3.6 mmol/L (ref 3.5–5.3)
Sodium: 143 mmol/L (ref 135–146)
Total Bilirubin: 1.2 mg/dL (ref 0.2–1.2)
Total Protein: 7.1 g/dL (ref 6.1–8.1)

## 2015-06-28 LAB — LIPID PANEL
Cholesterol: 148 mg/dL (ref 125–200)
HDL: 45 mg/dL (ref >=40)
LDL Cholesterol: 88 mg/dL (ref <130)
Total CHOL/HDL Ratio: 3.3 Ratio (ref <=5.0)
Triglycerides: 75 mg/dL (ref <150)
VLDL: 15 mg/dL (ref <30)

## 2015-06-28 LAB — TESTOSTERONE: Testosterone: 350 ng/dL (ref 300–890)

## 2015-08-04 NOTE — Progress Notes (Signed)
  Medical screening examination/treatment/procedure(s) were performed by non-physician practitioner and as supervising physician I was immediately available for consultation/collaboration.     

## 2015-08-29 ENCOUNTER — Telehealth: Payer: Self-pay

## 2015-08-29 DIAGNOSIS — I10 Essential (primary) hypertension: Secondary | ICD-10-CM

## 2015-08-29 DIAGNOSIS — E119 Type 2 diabetes mellitus without complications: Secondary | ICD-10-CM

## 2015-08-29 DIAGNOSIS — E785 Hyperlipidemia, unspecified: Secondary | ICD-10-CM

## 2015-08-29 MED ORDER — GLIMEPIRIDE 4 MG PO TABS: 4.0000 mg | ORAL_TABLET | Freq: Two times a day (BID) | ORAL | Status: DC

## 2015-08-29 MED ORDER — ATORVASTATIN CALCIUM 10 MG PO TABS: 10.0000 mg | ORAL_TABLET | Freq: Every day | ORAL | Status: DC

## 2015-08-29 MED ORDER — METFORMIN HCL 500 MG PO TABS: 1000.0000 mg | ORAL_TABLET | Freq: Two times a day (BID) | ORAL | Status: DC

## 2015-08-29 MED ORDER — AMLODIPINE BESYLATE 10 MG PO TABS: 10.0000 mg | ORAL_TABLET | Freq: Every day | ORAL | Status: DC

## 2015-08-29 MED ORDER — LISINOPRIL-HYDROCHLOROTHIAZIDE 20-12.5 MG PO TABS: 1.0000 | ORAL_TABLET | Freq: Every day | ORAL | Status: DC

## 2015-08-29 NOTE — Telephone Encounter (Signed)
All Rx's sent to [facility]. Notified pt on voicemail.

## 2015-08-29 NOTE — Telephone Encounter (Signed)
Patient needs ALL meds sent to [facility]. Patient does not really know the exact address but he did provide two different phone numbers: [phone] and [phone].   [phone]

## 2015-09-12 ENCOUNTER — Telehealth: Payer: Self-pay

## 2015-09-12 DIAGNOSIS — E785 Hyperlipidemia, unspecified: Secondary | ICD-10-CM

## 2015-09-12 DIAGNOSIS — I10 Essential (primary) hypertension: Secondary | ICD-10-CM

## 2015-09-12 DIAGNOSIS — E119 Type 2 diabetes mellitus without complications: Secondary | ICD-10-CM

## 2015-09-12 NOTE — Telephone Encounter (Signed)
Pt states all of his medicine was sent to the wrong place, need all of them to be sent to [facility]. Please call pt at [phone], states he have been out of his meds for about 2 months Now     [facility]

## 2015-09-13 NOTE — Telephone Encounter (Signed)
Please send to the appropriate pharmacy and I will be happy to cosign.  Philis Fendt, MS, PA-C 7:36 PM, 09/13/2015

## 2015-09-13 NOTE — Telephone Encounter (Signed)
Out for 2 months? Can I send in or should he RTC?

## 2015-09-14 MED ORDER — GLIMEPIRIDE 4 MG PO TABS: 4.0000 mg | ORAL_TABLET | Freq: Two times a day (BID) | ORAL | Status: DC

## 2015-09-14 MED ORDER — LISINOPRIL-HYDROCHLOROTHIAZIDE 20-12.5 MG PO TABS: 1.0000 | ORAL_TABLET | Freq: Every day | ORAL | Status: DC

## 2015-09-14 MED ORDER — METFORMIN HCL 500 MG PO TABS: 1000.0000 mg | ORAL_TABLET | Freq: Two times a day (BID) | ORAL | Status: DC

## 2015-09-14 MED ORDER — AMLODIPINE BESYLATE 10 MG PO TABS: 10.0000 mg | ORAL_TABLET | Freq: Every day | ORAL | Status: DC

## 2015-09-14 MED ORDER — ATORVASTATIN CALCIUM 10 MG PO TABS: 10.0000 mg | ORAL_TABLET | Freq: Every day | ORAL | Status: DC

## 2015-09-14 NOTE — Telephone Encounter (Signed)
Rx's sent to [facility].

## 2015-09-17 ENCOUNTER — Other Ambulatory Visit: Payer: Self-pay

## 2015-09-17 DIAGNOSIS — E785 Hyperlipidemia, unspecified: Secondary | ICD-10-CM

## 2015-09-17 DIAGNOSIS — E119 Type 2 diabetes mellitus without complications: Secondary | ICD-10-CM

## 2015-09-17 DIAGNOSIS — I10 Essential (primary) hypertension: Secondary | ICD-10-CM

## 2015-09-17 MED ORDER — AMLODIPINE BESYLATE 10 MG PO TABS: 10.0000 mg | ORAL_TABLET | Freq: Every day | ORAL | Status: DC

## 2015-09-17 MED ORDER — METFORMIN HCL 500 MG PO TABS: 1000.0000 mg | ORAL_TABLET | Freq: Two times a day (BID) | ORAL | Status: DC

## 2015-09-17 MED ORDER — LISINOPRIL-HYDROCHLOROTHIAZIDE 20-12.5 MG PO TABS: 1.0000 | ORAL_TABLET | Freq: Every day | ORAL | Status: DC

## 2015-09-17 MED ORDER — ATORVASTATIN CALCIUM 10 MG PO TABS: 10.0000 mg | ORAL_TABLET | Freq: Every day | ORAL | Status: DC

## 2015-09-17 MED ORDER — GLIMEPIRIDE 4 MG PO TABS: 4.0000 mg | ORAL_TABLET | Freq: Two times a day (BID) | ORAL | Status: DC

## 2015-09-17 NOTE — Telephone Encounter (Signed)
Need to send in Rxs in MD name. Re-sent.

## 2015-11-05 ENCOUNTER — Other Ambulatory Visit: Payer: Self-pay | Admitting: Family Medicine

## 2015-11-06 NOTE — Telephone Encounter (Signed)
Lm patient needs an appointment for further refills

## 2016-02-29 ENCOUNTER — Telehealth: Payer: Self-pay

## 2016-02-29 ENCOUNTER — Ambulatory Visit (INDEPENDENT_AMBULATORY_CARE_PROVIDER_SITE_OTHER): Payer: Medicare Other | Admitting: Physician Assistant

## 2016-02-29 VITALS — BP 148/100 | HR 60 | Temp 98.1°F | Resp 18 | Ht 72.25 in | Wt 181.6 lb

## 2016-02-29 DIAGNOSIS — I10 Essential (primary) hypertension: Secondary | ICD-10-CM

## 2016-02-29 DIAGNOSIS — E119 Type 2 diabetes mellitus without complications: Secondary | ICD-10-CM | POA: Diagnosis not present

## 2016-02-29 DIAGNOSIS — E785 Hyperlipidemia, unspecified: Secondary | ICD-10-CM | POA: Diagnosis not present

## 2016-02-29 LAB — COMPLETE METABOLIC PANEL WITH GFR
ALT: 12 U/L (ref 9–46)
AST: 16 U/L (ref 10–35)
Albumin: 3.9 g/dL (ref 3.6–5.1)
Alkaline Phosphatase: 49 U/L (ref 40–115)
BUN: 23 mg/dL (ref 7–25)
CO2: 27 mmol/L (ref 20–31)
Calcium: 10.2 mg/dL (ref 8.6–10.3)
Chloride: 109 mmol/L (ref 98–110)
Creat: 1.14 mg/dL (ref 0.70–1.18)
GFR, Est African American: 71 mL/min (ref >=60)
GFR, Est Non African American: 62 mL/min (ref >=60)
Glucose, Bld: 141 mg/dL — ABNORMAL HIGH (ref 65–99)
Potassium: 3.8 mmol/L (ref 3.5–5.3)
Sodium: 143 mmol/L (ref 135–146)
Total Bilirubin: 0.8 mg/dL (ref 0.2–1.2)
Total Protein: 7.2 g/dL (ref 6.1–8.1)

## 2016-02-29 LAB — LIPID PANEL
Cholesterol: 170 mg/dL (ref 125–200)
HDL: 48 mg/dL (ref >=40)
LDL Cholesterol: 104 mg/dL (ref <130)
Total CHOL/HDL Ratio: 3.5 Ratio (ref <=5.0)
Triglycerides: 89 mg/dL (ref <150)
VLDL: 18 mg/dL (ref <30)

## 2016-02-29 LAB — HEMOGLOBIN A1C
Hgb A1c MFr Bld: 5.8 % — ABNORMAL HIGH (ref <5.7)
Mean Plasma Glucose: 120 mg/dL

## 2016-02-29 MED ORDER — AMLODIPINE BESYLATE 10 MG PO TABS: ORAL_TABLET | ORAL | Status: DC

## 2016-02-29 MED ORDER — ATORVASTATIN CALCIUM 10 MG PO TABS: 10.0000 mg | ORAL_TABLET | Freq: Every day | ORAL | Status: DC

## 2016-02-29 MED ORDER — GLIMEPIRIDE 4 MG PO TABS: 4.0000 mg | ORAL_TABLET | Freq: Two times a day (BID) | ORAL | Status: DC

## 2016-02-29 MED ORDER — LISINOPRIL-HYDROCHLOROTHIAZIDE 20-12.5 MG PO TABS: 1.0000 | ORAL_TABLET | Freq: Every day | ORAL | Status: DC

## 2016-02-29 MED ORDER — METFORMIN HCL 500 MG PO TABS: 1000.0000 mg | ORAL_TABLET | Freq: Two times a day (BID) | ORAL | Status: DC

## 2016-02-29 NOTE — Progress Notes (Signed)
[facility] [address] [place] [address] [phone]  Date:  02/29/2016   Name:  Theodore Frank   DOB:  January 03, 1936   MRN:  [id number]  PCP:  [name], MD   Chief Complaint  Patient presents with  . Medication Refill    all meds.    History of Present Illness:  Theodore Frank is a 80 y.o. male patient who presents to [facility] for cc of medication refill.   Blood pressure out for over 1 week.  No chest pains, sob, palpitations, leg swelling, vision changes.   Uses his support stockings when he is at work, he drives long Building control surveyor carrying.   Diet consist of pork restriction, junk food, and eats vegetables.  He avoids excessive sodium with chips.  May eat canned foods once in a while.     Currently not exercising, as he lives alone and states that he does not want to have a heart attack and not have anyone at home.    Patient Active Problem List   Diagnosis Date Noted  . HNP (herniated nucleus pulposus), lumbar 11/06/2014  . Malignant neoplasm of prostate ([place]) 04/05/2013  . S/P right inguinal hernia repair 04/02/2012  . Right inguinal hernia 02/25/2012  . Hypertension 01/30/2012  . Hyperthyroidism 01/30/2012  . Diabetes mellitus type 2, controlled, without complications ([place]) 0000000  . Hyperlipidemia 01/30/2012    Past Medical History  Diagnosis Date  . Diabetes mellitus   . Hyperlipidemia   . Hypertension   . Cancer ([place])     colon  . Prostate cancer ([place])     radiation  . Shortness of breath dyspnea     with exertion  . Arthritis   . Anemia     Past Surgical History  Procedure Laterality Date  . Colon surgery  2001    colon resection  . Inguinal hernia repair  03/05/2012    Procedure: HERNIA REPAIR INGUINAL ADULT;  Surgeon: [name], MD;  Location: [place];  Service: General;  Laterality: Right;  Repair right inguinal hernia with mesh  . Prostate surgery    . Hernia repair Right   . Lumbar  laminectomy/decompression microdiscectomy Left 11/06/2014    Procedure: LEFT LUMBAR THREE-FOUR LAMINECTOMY/DECOMPRESSION MICRODISCECTOMY 1 LEVEL;  Surgeon: [name], MD;  Location: [place] [facility];  Service: Neurosurgery;  Laterality: Left;    Social History  Substance Use Topics  . Smoking status: Former Smoker    Quit date: 10/13/1965  . Smokeless tobacco: None  . Alcohol Use: No    History reviewed. No pertinent family history.  No Known Allergies  Medication list has been reviewed and updated.  Current Outpatient Prescriptions on File Prior to Visit  Medication Sig Dispense Refill  . amLODipine (NORVASC) 10 MG tablet Take 1 tablet by mouth  daily 90 tablet 0  . atorvastatin (LIPITOR) 10 MG tablet Take 1 tablet by mouth  daily 90 tablet 0  . glimepiride (AMARYL) 4 MG tablet Take 1 tablet by mouth two  times daily with a meal 180 tablet 0  . lisinopril-hydrochlorothiazide (PRINZIDE,ZESTORETIC) 20-12.5 MG tablet Take 1 tablet by mouth daily. 90 tablet 1  . metFORMIN (GLUCOPHAGE) 500 MG tablet Take 2 tablets by mouth two times daily with a meal 360 tablet 0   No current facility-administered medications on file prior to visit.    ROS ROS otherwise unremarkable unless listed above  Physical Examination: BP 188/100 mmHg  Pulse 60  Temp(Src) 98.1 F (36.7 C) (Oral)  Resp 18  Ht 6' 0.25" (1.835 m)  Wt 181 lb 9.6 oz (82.373 kg)  BMI 24.46 kg/m2  SpO2 98% Ideal Body Weight: Weight in (lb) to have BMI = 25: 185.2  Physical Exam  Constitutional: He is oriented to person, place, and time. He appears well-developed and well-nourished. No distress.  HENT:  Head: Normocephalic and atraumatic.  Eyes: Conjunctivae and EOM are normal. Pupils are equal, round, and reactive to light.  Cardiovascular: Normal rate, regular rhythm, normal heart sounds and intact distal pulses.  Exam reveals no friction rub.   No murmur heard. Pulmonary/Chest: Effort normal. No apnea. No respiratory  distress. He has no decreased breath sounds. He has no wheezes. He has no rhonchi.  Abdominal: Soft. Bowel sounds are normal. He exhibits no distension.  Feet:  Right Foot:  Protective Sensation: 6 sites tested.6 sites sensed. Skin Integrity: Negative for ulcer, blister, skin breakdown or erythema.  Left Foot:  Protective Sensation: 6 sites tested. 6 sites sensed. Skin Integrity: Negative for ulcer, blister, skin breakdown or erythema.  Neurological: He is alert and oriented to person, place, and time.  Skin: Skin is warm and dry. He is not diaphoretic.  Psychiatric: He has a normal mood and affect. His behavior is normal.     Assessment and Plan: Theodore Frank is a 80 y.o. male who is here today for medication refill. Cautioned patient to never not have medications, and checking blood sugar.  Advised start of exercise, once blood pressure is well controlled, but this is obviously elevated from >week non-compliance.   Below labs obtained.  He will follow up in 6 months.   Controlled type 2 diabetes mellitus without complication, unspecified long term insulin use status ([place]) - Plan: COMPLETE METABOLIC PANEL WITH GFR, Hemoglobin A1c, glimepiride (AMARYL) 4 MG tablet, metFORMIN (GLUCOPHAGE) 500 MG tablet  Hyperlipidemia - Plan: Lipid panel, atorvastatin (LIPITOR) 10 MG tablet  Essential hypertension - Plan: amLODipine (NORVASC) 10 MG tablet, lisinopril-hydrochlorothiazide (PRINZIDE,ZESTORETIC) 20-12.5 MG tablet  Ivar Drape, PA-C [facility] 02/29/2016 9:39 AM

## 2016-02-29 NOTE — Telephone Encounter (Signed)
Pt was seen in office today and was prescribed BP medication which was sent to mail order pharmacy. Pt needs to begin medication as soon as possible. Called and left message on pt's home voicemail asking If he would like [place] to call in a prescription to a local pharmacy until his prescription arrives in the mail. Also left message asking when pt's last eye appt was.

## 2016-02-29 NOTE — Telephone Encounter (Signed)
Patient returned phone called. Patient request for his medication to be called into [facility] on [address]. Last eye exam was last year. [phone].

## 2016-02-29 NOTE — Patient Instructions (Addendum)
IF you received an x-ray today, you will receive an invoice from [facility]. Please contact [facility] at [phone] with questions or concerns regarding your invoice.   IF you received labwork today, you will receive an invoice from [employer]. Please contact [facility] at [phone] with questions or concerns regarding your invoice.   Our billing staff will not be able to assist you with questions regarding bills from these companies.  You will be contacted with the lab results as soon as they are available. The fastest way to get your results is to activate your My Chart account. Instructions are located on the last page of this paperwork. If you have not heard from [place] regarding the results in 2 weeks, please contact this office.    I will have your lab results within the next 10 days.   Diabetes Mellitus and Food It is important for you to manage your blood sugar (glucose) level. Your blood glucose level can be greatly affected by what you eat. Eating healthier foods in the appropriate amounts throughout the day at about the same time each day will help you control your blood glucose level. It can also help slow or prevent worsening of your diabetes mellitus. Healthy eating may even help you improve the level of your blood pressure and reach or maintain a healthy weight.  General recommendations for healthful eating and cooking habits include:  Eating meals and snacks regularly. Avoid going long periods of time without eating to lose weight.  Eating a diet that consists mainly of plant-based foods, such as fruits, vegetables, nuts, legumes, and whole grains.  Using low-heat cooking methods, such as baking, instead of high-heat cooking methods, such as deep frying. Work with your dietitian to make sure you understand how to use the Nutrition Facts information on food labels. HOW CAN FOOD AFFECT ME? Carbohydrates Carbohydrates affect  your blood glucose level more than any other type of food. Your dietitian will help you determine how many carbohydrates to eat at each meal and teach you how to count carbohydrates. Counting carbohydrates is important to keep your blood glucose at a healthy level, especially if you are using insulin or taking certain medicines for diabetes mellitus. Alcohol Alcohol can cause sudden decreases in blood glucose (hypoglycemia), especially if you use insulin or take certain medicines for diabetes mellitus. Hypoglycemia can be a life-threatening condition. Symptoms of hypoglycemia (sleepiness, dizziness, and disorientation) are similar to symptoms of having too much alcohol.  If your health care provider has given you approval to drink alcohol, do so in moderation and use the following guidelines:  Women should not have more than one drink per day, and men should not have more than two drinks per day. One drink is equal to:  12 oz of beer.  5 oz of wine.  1 oz of hard liquor.  Do not drink on an empty stomach.  Keep yourself hydrated. Have water, diet soda, or unsweetened iced tea.  Regular soda, juice, and other mixers might contain a lot of carbohydrates and should be counted. WHAT FOODS ARE NOT RECOMMENDED? As you make food choices, it is important to remember that all foods are not the same. Some foods have fewer nutrients per serving than other foods, even though they might have the same number of calories or carbohydrates. It is difficult to get your body what it needs when you eat foods with fewer nutrients. Examples of foods that you should avoid that are  high in calories and carbohydrates but low in nutrients include:  Trans fats (most processed foods list trans fats on the Nutrition Facts label).  Regular soda.  Juice.  Candy.  Sweets, such as cake, pie, doughnuts, and cookies.  Fried foods. WHAT FOODS CAN I EAT? Eat nutrient-rich foods, which will nourish your body and keep  you healthy. The food you should eat also will depend on several factors, including:  The calories you need.  The medicines you take.  Your weight.  Your blood glucose level.  Your blood pressure level.  Your cholesterol level. You should eat a variety of foods, including:  Protein.  Lean cuts of meat.  Proteins low in saturated fats, such as fish, egg whites, and beans. Avoid processed meats.  Fruits and vegetables.  Fruits and vegetables that may help control blood glucose levels, such as apples, mangoes, and yams.  Dairy products.  Choose fat-free or low-fat dairy products, such as milk, yogurt, and cheese.  Grains, bread, pasta, and rice.  Choose whole grain products, such as multigrain bread, whole oats, and brown rice. These foods may help control blood pressure.  Fats.  Foods containing healthful fats, such as nuts, avocado, olive oil, canola oil, and fish. DOES EVERYONE WITH DIABETES MELLITUS HAVE THE SAME MEAL PLAN? Because every person with diabetes mellitus is different, there is not one meal plan that works for everyone. It is very important that you meet with a dietitian who will help you create a meal plan that is just right for you.   This information is not intended to replace advice given to you by your health care provider. Make sure you discuss any questions you have with your health care provider.   Document Released: 06/26/2005 Document Revised: 10/20/2014 Document Reviewed: 08/26/2013 Elsevier Interactive Patient Education [employer].

## 2016-02-29 NOTE — Telephone Encounter (Signed)
Rx sent 

## 2016-03-18 IMAGING — CR DG LUMBAR SPINE 2-3V
2 series · 2 of 2 positions shown · non-contrast
Comparison: MRI 10/19/2014

CLINICAL DATA: Lumbar laminectomy/ decompression.  L3-4.

EXAM:
LUMBAR SPINE - 2-3 VIEW

[lat (1 of 2)]
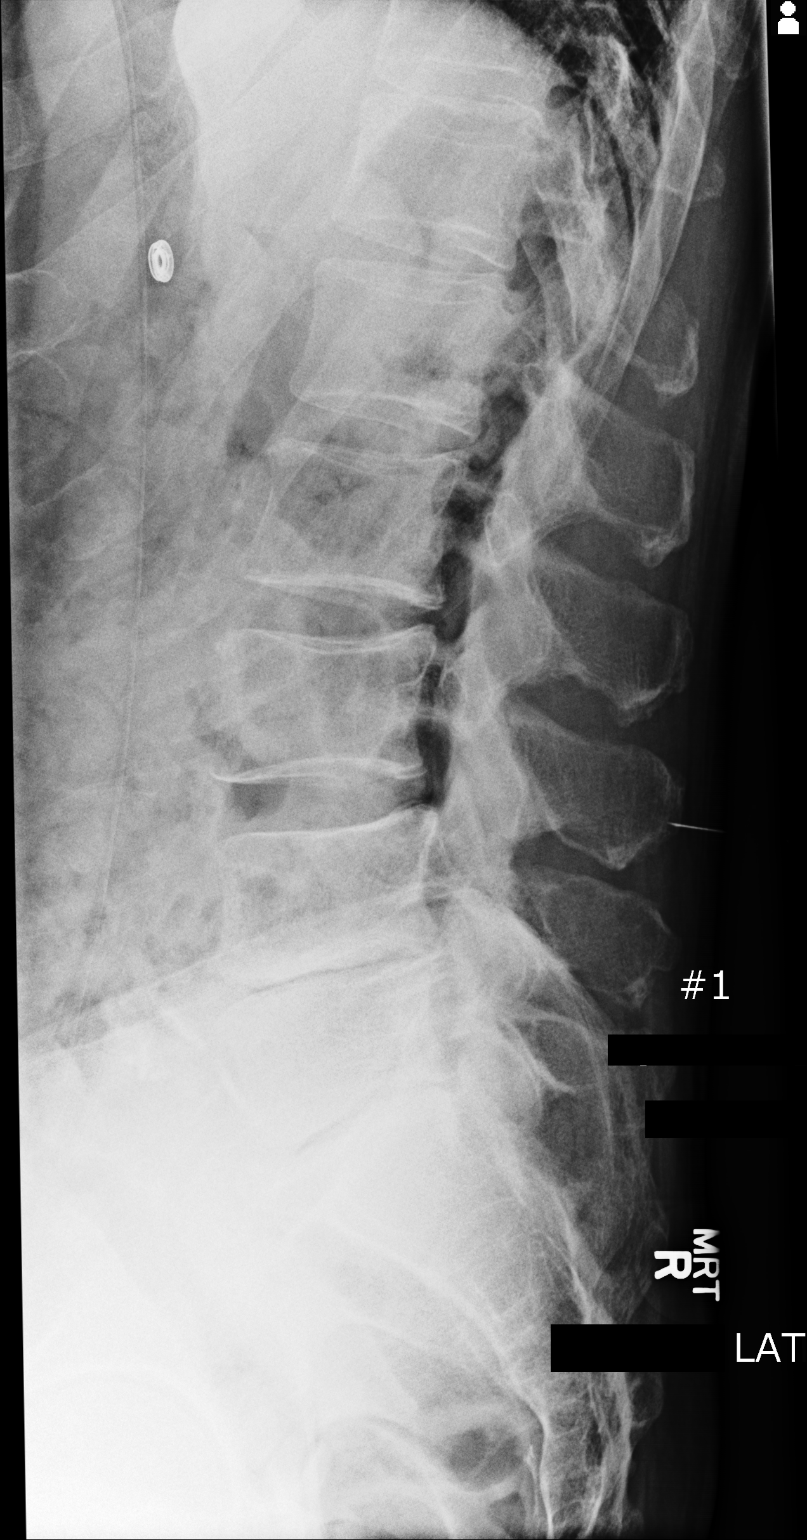

[lat (2 of 2)]
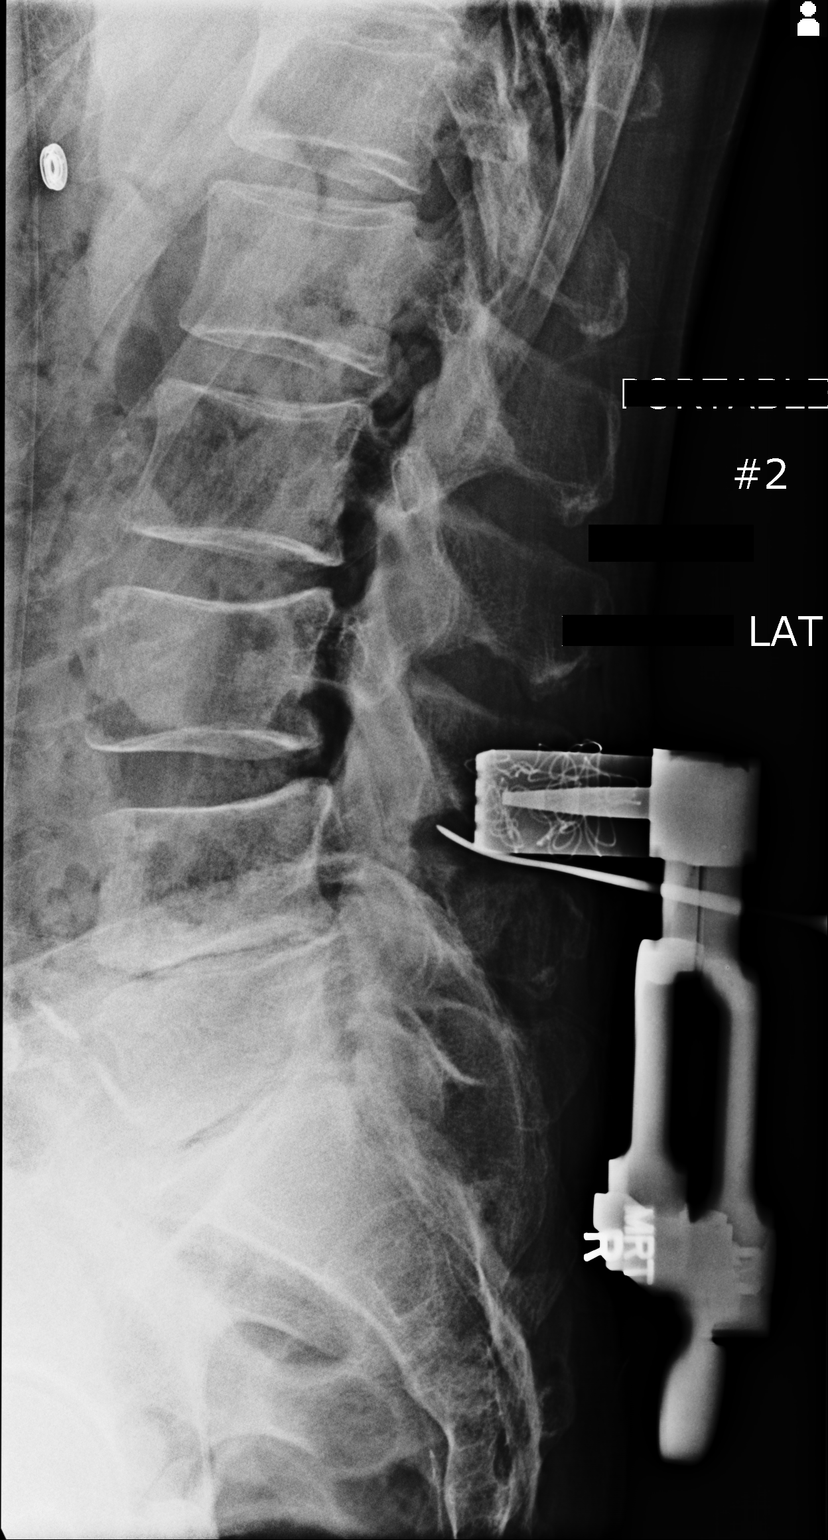

[2 of 2 positions shown; findings below may reference images not displayed]

FINDINGS: Two lateral views. The first, labeled 1526 hr, demonstrates a
surgical device projecting posterior to the L3 spinous process and
at the L3-4 interspace. Degenerative disc disease involves L4-5 and
L5-S1.

The second image, labeled 3144 hr, demonstrates surgical devices
projecting posterior to the L3-4 interspace and the superior aspect
of the L4 vertebral body.
IMPRESSION: Intraoperative localization.

## 2016-03-28 ENCOUNTER — Other Ambulatory Visit: Payer: Self-pay | Admitting: Physician Assistant

## 2016-04-22 ENCOUNTER — Encounter (HOSPITAL_COMMUNITY): Payer: Self-pay | Admitting: *Deleted

## 2016-04-22 ENCOUNTER — Observation Stay (HOSPITAL_COMMUNITY)
Admission: EM | Admit: 2016-04-22 | Discharge: 2016-04-23 | Disposition: A | Discharge status: Home / Self Care | Payer: Medicare Other | Attending: Internal Medicine | Admitting: Internal Medicine

## 2016-04-22 DIAGNOSIS — N183 Chronic kidney disease, stage 3 unspecified: Secondary | ICD-10-CM

## 2016-04-22 DIAGNOSIS — Z923 Personal history of irradiation: Secondary | ICD-10-CM | POA: Diagnosis not present

## 2016-04-22 DIAGNOSIS — Z79899 Other long term (current) drug therapy: Secondary | ICD-10-CM | POA: Diagnosis not present

## 2016-04-22 DIAGNOSIS — R4182 Altered mental status, unspecified: Secondary | ICD-10-CM | POA: Diagnosis present

## 2016-04-22 DIAGNOSIS — M1990 Unspecified osteoarthritis, unspecified site: Secondary | ICD-10-CM | POA: Diagnosis not present

## 2016-04-22 DIAGNOSIS — E876 Hypokalemia: Secondary | ICD-10-CM | POA: Diagnosis not present

## 2016-04-22 DIAGNOSIS — Z87891 Personal history of nicotine dependence: Secondary | ICD-10-CM | POA: Insufficient documentation

## 2016-04-22 DIAGNOSIS — G9340 Encephalopathy, unspecified: Secondary | ICD-10-CM | POA: Diagnosis not present

## 2016-04-22 DIAGNOSIS — Z7984 Long term (current) use of oral hypoglycemic drugs: Secondary | ICD-10-CM | POA: Insufficient documentation

## 2016-04-22 DIAGNOSIS — E11649 Type 2 diabetes mellitus with hypoglycemia without coma: Secondary | ICD-10-CM | POA: Diagnosis not present

## 2016-04-22 DIAGNOSIS — E1122 Type 2 diabetes mellitus with diabetic chronic kidney disease: Secondary | ICD-10-CM | POA: Diagnosis not present

## 2016-04-22 DIAGNOSIS — I129 Hypertensive chronic kidney disease with stage 1 through stage 4 chronic kidney disease, or unspecified chronic kidney disease: Secondary | ICD-10-CM | POA: Diagnosis not present

## 2016-04-22 DIAGNOSIS — R0602 Shortness of breath: Secondary | ICD-10-CM | POA: Diagnosis not present

## 2016-04-22 DIAGNOSIS — Z791 Long term (current) use of non-steroidal anti-inflammatories (NSAID): Secondary | ICD-10-CM | POA: Insufficient documentation

## 2016-04-22 DIAGNOSIS — E118 Type 2 diabetes mellitus with unspecified complications: Secondary | ICD-10-CM

## 2016-04-22 DIAGNOSIS — IMO0002 Reserved for concepts with insufficient information to code with codable children: Secondary | ICD-10-CM | POA: Diagnosis present

## 2016-04-22 DIAGNOSIS — E785 Hyperlipidemia, unspecified: Secondary | ICD-10-CM | POA: Diagnosis not present

## 2016-04-22 DIAGNOSIS — R7989 Other specified abnormal findings of blood chemistry: Secondary | ICD-10-CM | POA: Diagnosis present

## 2016-04-22 DIAGNOSIS — E119 Type 2 diabetes mellitus without complications: Secondary | ICD-10-CM

## 2016-04-22 DIAGNOSIS — E162 Hypoglycemia, unspecified: Secondary | ICD-10-CM | POA: Diagnosis present

## 2016-04-22 DIAGNOSIS — I10 Essential (primary) hypertension: Secondary | ICD-10-CM | POA: Diagnosis present

## 2016-04-22 LAB — CBC WITH DIFFERENTIAL/PLATELET
Basophils Absolute: 0 10*3/uL (ref 0.0–0.1)
Basophils Relative: 0 %
Eosinophils Absolute: 0.1 10*3/uL (ref 0.0–0.7)
Eosinophils Relative: 1 %
HCT: 39.1 % (ref 39.0–52.0)
Hemoglobin: 13.5 g/dL (ref 13.0–17.0)
Lymphocytes Relative: 18 %
Lymphs Abs: 1.8 10*3/uL (ref 0.7–4.0)
MCH: 31 pg (ref 26.0–34.0)
MCHC: 34.5 g/dL (ref 30.0–36.0)
MCV: 89.7 fL (ref 78.0–100.0)
Monocytes Absolute: 0.4 10*3/uL (ref 0.1–1.0)
Monocytes Relative: 4 %
Neutro Abs: 7.9 10*3/uL — ABNORMAL HIGH (ref 1.7–7.7)
Neutrophils Relative %: 77 %
Platelets: 208 10*3/uL (ref 150–400)
RBC: 4.36 MIL/uL (ref 4.22–5.81)
RDW: 12.6 % (ref 11.5–15.5)
WBC: 10.2 10*3/uL (ref 4.0–10.5)

## 2016-04-22 LAB — I-STAT CHEM 8, ED
BUN: 58 mg/dL — ABNORMAL HIGH (ref 6–20)
Calcium, Ion: 1.32 mmol/L — ABNORMAL HIGH (ref 1.12–1.23)
Chloride: 105 mmol/L (ref 101–111)
Creatinine, Ser: 1.5 mg/dL — ABNORMAL HIGH (ref 0.61–1.24)
Glucose, Bld: 45 mg/dL — ABNORMAL LOW (ref 65–99)
HCT: 39 % (ref 39.0–52.0)
Hemoglobin: 13.3 g/dL (ref 13.0–17.0)
Potassium: 7.6 mmol/L (ref 3.5–5.1)
Sodium: 138 mmol/L (ref 135–145)
TCO2: 31 mmol/L (ref 0–100)

## 2016-04-22 NOTE — ED Notes (Signed)
Pt states he suddenly felt confused and had uncontrollable body movements at 5PM today, which resolved after 5-10 minutes. Pt states he had 3 episodes today. Pt states he now feels at his mental baseline. Pt is now alert and oriented x 4, no facial droop or extremity weakness.

## 2016-04-23 ENCOUNTER — Emergency Department (HOSPITAL_COMMUNITY): Payer: Medicare Other

## 2016-04-23 ENCOUNTER — Encounter (HOSPITAL_COMMUNITY): Payer: Self-pay | Admitting: Emergency Medicine

## 2016-04-23 DIAGNOSIS — E119 Type 2 diabetes mellitus without complications: Secondary | ICD-10-CM | POA: Diagnosis not present

## 2016-04-23 DIAGNOSIS — R7989 Other specified abnormal findings of blood chemistry: Secondary | ICD-10-CM | POA: Diagnosis present

## 2016-04-23 DIAGNOSIS — N183 Chronic kidney disease, stage 3 unspecified: Secondary | ICD-10-CM

## 2016-04-23 DIAGNOSIS — R0602 Shortness of breath: Secondary | ICD-10-CM | POA: Diagnosis not present

## 2016-04-23 DIAGNOSIS — G9340 Encephalopathy, unspecified: Secondary | ICD-10-CM

## 2016-04-23 DIAGNOSIS — E11649 Type 2 diabetes mellitus with hypoglycemia without coma: Secondary | ICD-10-CM | POA: Diagnosis not present

## 2016-04-23 DIAGNOSIS — E162 Hypoglycemia, unspecified: Secondary | ICD-10-CM | POA: Diagnosis not present

## 2016-04-23 DIAGNOSIS — IMO0002 Reserved for concepts with insufficient information to code with codable children: Secondary | ICD-10-CM | POA: Diagnosis present

## 2016-04-23 DIAGNOSIS — R4182 Altered mental status, unspecified: Secondary | ICD-10-CM | POA: Diagnosis not present

## 2016-04-23 HISTORY — DX: Encephalopathy, unspecified: G93.40

## 2016-04-23 LAB — GLUCOSE, CAPILLARY
Glucose-Capillary: 61 mg/dL — ABNORMAL LOW (ref 65–99)
Glucose-Capillary: 88 mg/dL (ref 65–99)
Glucose-Capillary: 82 mg/dL (ref 65–99)
Glucose-Capillary: 95 mg/dL (ref 65–99)
Glucose-Capillary: 76 mg/dL (ref 65–99)
Glucose-Capillary: 101 mg/dL — ABNORMAL HIGH (ref 65–99)

## 2016-04-23 LAB — URINE MICROSCOPIC-ADD ON

## 2016-04-23 LAB — BASIC METABOLIC PANEL
Anion gap: 7 (ref 5–15)
Anion gap: 5 (ref 5–15)
BUN: 37 mg/dL — ABNORMAL HIGH (ref 6–20)
BUN: 37 mg/dL — ABNORMAL HIGH (ref 6–20)
CO2: 25 mmol/L (ref 22–32)
CO2: 27 mmol/L (ref 22–32)
Calcium: 10.9 mg/dL — ABNORMAL HIGH (ref 8.9–10.3)
Calcium: 10.7 mg/dL — ABNORMAL HIGH (ref 8.9–10.3)
Chloride: 107 mmol/L (ref 101–111)
Chloride: 107 mmol/L (ref 101–111)
Creatinine, Ser: 1.53 mg/dL — ABNORMAL HIGH (ref 0.61–1.24)
Creatinine, Ser: 1.31 mg/dL — ABNORMAL HIGH (ref 0.61–1.24)
GFR calc Af Amer: 48 mL/min — ABNORMAL LOW (ref >60)
GFR calc Af Amer: 58 mL/min — ABNORMAL LOW (ref >60)
GFR calc non Af Amer: 42 mL/min — ABNORMAL LOW (ref >60)
GFR calc non Af Amer: 50 mL/min — ABNORMAL LOW (ref >60)
Glucose, Bld: 49 mg/dL — ABNORMAL LOW (ref 65–99)
Glucose, Bld: 65 mg/dL (ref 65–99)
Potassium: 3.6 mmol/L (ref 3.5–5.1)
Potassium: 3.1 mmol/L — ABNORMAL LOW (ref 3.5–5.1)
Sodium: 139 mmol/L (ref 135–145)
Sodium: 139 mmol/L (ref 135–145)

## 2016-04-23 LAB — URINALYSIS, ROUTINE W REFLEX MICROSCOPIC
Bilirubin Urine: NEGATIVE
Glucose, UA: NEGATIVE mg/dL
Hgb urine dipstick: NEGATIVE
Ketones, ur: NEGATIVE mg/dL
Nitrite: NEGATIVE
Protein, ur: NEGATIVE mg/dL
Specific Gravity, Urine: 1.016 (ref 1.005–1.030)
pH: 5 (ref 5.0–8.0)

## 2016-04-23 LAB — CBG MONITORING, ED
Glucose-Capillary: 58 mg/dL — ABNORMAL LOW (ref 65–99)
Glucose-Capillary: 119 mg/dL — ABNORMAL HIGH (ref 65–99)

## 2016-04-23 LAB — I-STAT TROPONIN, ED: Troponin i, poc: 0.01 ng/mL (ref 0.00–0.08)

## 2016-04-23 LAB — TSH: TSH: 0.453 u[IU]/mL (ref 0.350–4.500)

## 2016-04-23 MED ORDER — DEXTROSE-NACL 5-0.45 % IV SOLN: INTRAVENOUS | Status: DC

## 2016-04-23 MED ORDER — FOSFOMYCIN TROMETHAMINE 3 G PO PACK
3.0000 g | PACK | Freq: Once | ORAL | Status: AC
  Administered 2016-04-23: 3 g via ORAL
  Filled 2016-04-23: qty 3

## 2016-04-23 MED ORDER — ENOXAPARIN SODIUM 40 MG/0.4ML ~~LOC~~ SOLN
40.0000 mg | SUBCUTANEOUS | Status: DC
  Administered 2016-04-23: 40 mg via SUBCUTANEOUS
  Filled 2016-04-23: qty 0.4

## 2016-04-23 MED ORDER — ATORVASTATIN CALCIUM 10 MG PO TABS
10.0000 mg | ORAL_TABLET | Freq: Every day | ORAL | Status: DC
  Administered 2016-04-23: 10 mg via ORAL
  Filled 2016-04-23: qty 1

## 2016-04-23 MED ORDER — GLIMEPIRIDE 4 MG PO TABS: 4.0000 mg | ORAL_TABLET | Freq: Every day | ORAL | Status: DC

## 2016-04-23 MED ORDER — DEXTROSE 50 % IV SOLN
50.0000 mL | Freq: Once | INTRAVENOUS | Status: AC
  Administered 2016-04-23: 50 mL via INTRAVENOUS

## 2016-04-23 MED ORDER — DEXTROSE 5 % IV SOLN
Freq: Once | INTRAVENOUS | Status: AC
  Administered 2016-04-23: 03:00:00 via INTRAVENOUS

## 2016-04-23 MED ORDER — SODIUM CHLORIDE 0.9% FLUSH
3.0000 mL | Freq: Two times a day (BID) | INTRAVENOUS | Status: DC
  Administered 2016-04-23: 3 mL via INTRAVENOUS

## 2016-04-23 MED ORDER — POTASSIUM CHLORIDE CRYS ER 20 MEQ PO TBCR
40.0000 meq | EXTENDED_RELEASE_TABLET | ORAL | Status: AC
  Administered 2016-04-23 (×2): 40 meq via ORAL
  Filled 2016-04-23 (×2): qty 2

## 2016-04-23 MED ORDER — DEXTROSE 50 % IV SOLN
INTRAVENOUS | Status: AC
  Filled 2016-04-23: qty 50

## 2016-04-23 MED ORDER — AMLODIPINE BESYLATE 10 MG PO TABS
10.0000 mg | ORAL_TABLET | Freq: Every day | ORAL | Status: DC
  Administered 2016-04-23: 10 mg via ORAL
  Filled 2016-04-23: qty 1

## 2016-04-23 MED ORDER — DEXTROSE-NACL 5-0.45 % IV SOLN
INTRAVENOUS | Status: DC
  Administered 2016-04-23: 06:00:00 via INTRAVENOUS

## 2016-04-23 NOTE — Progress Notes (Signed)
Patient discharged.  Educated on discharge instructions, medications, and to follow up with PCP.  Patient stated understanding and agreed.  AVS signed.  IV and telemetry removed.  Belongings gathered by patient.  No questions or concerns at this time.

## 2016-04-23 NOTE — Progress Notes (Signed)
Hypoglycemic Event  CBG: 61 at 0540   Treatment: OJ   Symptoms: N/A   Follow-up CBG: Time: 0603 CBG Result: 88   Possible Reasons for Event: Unsure   Comments/MD notified: Sheran Luz [place]

## 2016-04-23 NOTE — Discharge Summary (Signed)
Physician Discharge Summary  Theodore Frank [id number] DOB: [birth year] DOA: 04/22/2016  PCP: [name], MD  Admit date: 04/22/2016 Discharge date: 04/23/2016  Admitted From: home Disposition:  home   Recommendations for Outpatient Follow-up:  1. F/u glucose  Home Health:  None needed  Equipment/Devices:  none    Discharge Condition:  stable   CODE STATUS:  Full code   Diet recommendation:  Diabetic diet, heart heatlhy Consultations:  none    Discharge Diagnoses:  Principal Problem:   Hypoglycemia Active Problems:   Acute encephalopathy   Hypertension   Diabetes mellitus type 2, controlled, without complications (HCC)   Chronic kidney disease, stage 3    Subjective: No complaints. Eating well.   Brief Summary:  80 y/o male with DM on oral medication and HTN who presented for 3 episodes of confusion. This started on the evening of 7/11 around 6-7 PM. He fell, was shaking, weak, did not feel right. He remained conscious through out the episodes. He called a neighbor and was brought to the ER. His sugar in the ER was in the 40s.   Hospital Course:  Hypoglycemia in DM 2 - states that he eats very erratically, sometimes only has 1 meal a day and does not bother to check sugars - Last A1c 5.8 on 02/29/16 - he has been on a D5 infusion since admission- I have stopped it- sugar remains normal - I have change Glimepiride from 4 mg BID to 4 mg daily - requested that he check sugars frequently and take log to PCP in a few days - cont Metformin as ordered - eat regular meal at least 3 x day - f/u with PCP  CKD 3 - stable  HTN - Norvasc  Hypokalemia - replace  Discharge Instructions      Discharge Instructions    Discharge instructions    Complete by:  As directed   See your doctor by Friday.  Check your sugars before breakfast lunch and dinner.     Increase activity slowly    Complete by:  As directed             Medication List    TAKE these  medications        amLODipine 10 MG tablet  Commonly known as:  NORVASC  TAKE 1 TABLET BY MOUTH DAILY     atorvastatin 10 MG tablet  Commonly known as:  LIPITOR  Take 1 tablet (10 mg total) by mouth daily.     ferrous sulfate 325 (65 FE) MG EC tablet  Take 325 mg by mouth daily with breakfast.     glimepiride 4 MG tablet  Commonly known as:  AMARYL  Take 1 tablet (4 mg total) by mouth daily with breakfast.     ibuprofen 200 MG tablet  Commonly known as:  ADVIL,MOTRIN  Take 400 mg by mouth every 6 (six) hours as needed for headache, mild pain or moderate pain.     lisinopril-hydrochlorothiazide 20-12.5 MG tablet  Commonly known as:  PRINZIDE,ZESTORETIC  TAKE 1 TABLET BY MOUTH DAILY.     metFORMIN 500 MG tablet  Commonly known as:  GLUCOPHAGE  Take 2 tablets (1,000 mg total) by mouth 2 (two) times daily with a meal.     vitamin B-12 1000 MCG tablet  Commonly known as:  CYANOCOBALAMIN  Take 1,000 mcg by mouth daily.        No Known Allergies   Procedures/Studies: Dg Chest 2 View  04/23/2016  CLINICAL DATA:  80 year old male with altered mental status and shortness of breath EXAM: CHEST  2 VIEW COMPARISON:  Chest radiograph dated 12/18/2014 FINDINGS: The heart size and mediastinal contours are within normal limits. Both lungs are clear. The visualized skeletal structures are unremarkable. IMPRESSION: No active cardiopulmonary disease. Electronically Signed   By: [name] M.D.   On: 04/23/2016 02:00   Ct Head Wo Contrast  04/23/2016  CLINICAL DATA:  80 year old male with altered mental status. Anechoic shoulder body movements and forgetfulness. EXAM: CT HEAD WITHOUT CONTRAST CT CERVICAL SPINE WITHOUT CONTRAST TECHNIQUE: Multidetector CT imaging of the head and cervical spine was performed following the standard protocol without intravenous contrast. Multiplanar CT image reconstructions of the cervical spine were also generated. COMPARISON:  Head CT dated 11/26/2010  FINDINGS: CT HEAD FINDINGS There is mild age-related atrophy and chronic microvascular ischemic changes. There is no acute intracranial hemorrhage. No mass effect or midline shift noted. The visualized paranasal sinuses and mastoid air cells are clear. The calvarium is intact. Small right occipital scalp lipoma. CT CERVICAL SPINE FINDINGS There is no acute fracture or subluxation of the cervical spine.There is osteopenia with extensive multilevel degenerative changes, endplate irregularity and disc space narrowing. There is multilevel facet hypertrophy most prominent at C2-C3, C4-C5 on the left. There is associated narrowing of the neural foramina most prominent at C3-C4 on the left.The odontoid and spinous processes are intact.There is normal anatomic alignment of the C1-C2 lateral masses. The visualized soft tissues appear unremarkable. IMPRESSION: No acute intracranial hemorrhage. Mild age-related atrophy and chronic microvascular ischemic disease. No acute/traumatic cervical spine pathology. Electronically Signed   By: [name] M.D.   On: 04/23/2016 02:18   Ct Cervical Spine Wo Contrast  04/23/2016  CLINICAL DATA:  80 year old male with altered mental status. Anechoic shoulder body movements and forgetfulness. EXAM: CT HEAD WITHOUT CONTRAST CT CERVICAL SPINE WITHOUT CONTRAST TECHNIQUE: Multidetector CT imaging of the head and cervical spine was performed following the standard protocol without intravenous contrast. Multiplanar CT image reconstructions of the cervical spine were also generated. COMPARISON:  Head CT dated 11/26/2010 FINDINGS: CT HEAD FINDINGS There is mild age-related atrophy and chronic microvascular ischemic changes. There is no acute intracranial hemorrhage. No mass effect or midline shift noted. The visualized paranasal sinuses and mastoid air cells are clear. The calvarium is intact. Small right occipital scalp lipoma. CT CERVICAL SPINE FINDINGS There is no acute fracture or  subluxation of the cervical spine.There is osteopenia with extensive multilevel degenerative changes, endplate irregularity and disc space narrowing. There is multilevel facet hypertrophy most prominent at C2-C3, C4-C5 on the left. There is associated narrowing of the neural foramina most prominent at C3-C4 on the left.The odontoid and spinous processes are intact.There is normal anatomic alignment of the C1-C2 lateral masses. The visualized soft tissues appear unremarkable. IMPRESSION: No acute intracranial hemorrhage. Mild age-related atrophy and chronic microvascular ischemic disease. No acute/traumatic cervical spine pathology. Electronically Signed   By: [name] M.D.   On: 04/23/2016 02:18       Discharge Exam: Filed Vitals:   04/23/16 0330 04/23/16 0428  BP: 138/88 140/93  Pulse: 49 50  Temp:  98.1 F (36.7 C)  Resp: 13 20   Filed Vitals:   04/23/16 0300 04/23/16 0330 04/23/16 0428 04/23/16 0433  BP: 140/86 138/88 140/93   Pulse: 51 49 50   Temp:   98.1 F (36.7 C)   TempSrc:   Oral   Resp: 16 13 20    Height:  6\' 1"  (1.854 m)  Weight:    79.9 kg (176 lb 2.4 oz)  SpO2: 97% 100% 99%     General: Pt is alert, awake, not in acute distress Cardiovascular: RRR, S1/S2 +, no rubs, no gallops Respiratory: CTA bilaterally, no wheezing, no rhonchi Abdominal: Soft, NT, ND, bowel sounds + Extremities: no edema, no cyanosis    The results of significant diagnostics from this hospitalization (including imaging, microbiology, ancillary and laboratory) are listed below for reference.     Microbiology: No results found for this or any previous visit (from the past 240 hour(s)).   Labs: BNP (last 3 results) No results for input(s): BNP in the last 8760 hours. Basic Metabolic Panel:  Recent Labs Lab 04/22/16 2313 04/22/16 2336 04/23/16 0514  NA 139 138 139  K 3.6 7.6* 3.1*  CL 107 105 107  CO2 25  --  27  GLUCOSE 49* 45* 65  BUN 37* 58* 37*  CREATININE 1.53*  1.50* 1.31*  CALCIUM 10.9*  --  10.7*   Liver Function Tests: No results for input(s): AST, ALT, ALKPHOS, BILITOT, PROT, ALBUMIN in the last 168 hours. No results for input(s): LIPASE, AMYLASE in the last 168 hours. No results for input(s): AMMONIA in the last 168 hours. CBC:  Recent Labs Lab 04/22/16 2313 04/22/16 2336  WBC 10.2  --   NEUTROABS 7.9*  --   HGB 13.5 13.3  HCT 39.1 39.0  MCV 89.7  --   PLT 208  --    Cardiac Enzymes: No results for input(s): CKTOTAL, CKMB, CKMBINDEX, TROPONINI in the last 168 hours. BNP: Invalid input(s): POCBNP CBG:  Recent Labs Lab 04/23/16 0603 04/23/16 0746 04/23/16 1159 04/23/16 1421 04/23/16 1615  GLUCAP 88 82 95 76 101*   D-Dimer No results for input(s): DDIMER in the last 72 hours. Hgb A1c No results for input(s): HGBA1C in the last 72 hours. Lipid Profile No results for input(s): CHOL, HDL, LDLCALC, TRIG, CHOLHDL, LDLDIRECT in the last 72 hours. Thyroid function studies  Recent Labs  04/23/16 0514  TSH 0.453   Anemia work up No results for input(s): VITAMINB12, FOLATE, FERRITIN, TIBC, IRON, RETICCTPCT in the last 72 hours. Urinalysis    Component Value Date/Time   COLORURINE YELLOW 04/23/2016 0142   APPEARANCEUR CLOUDY* 04/23/2016 0142   LABSPEC 1.016 04/23/2016 0142   PHURINE 5.0 04/23/2016 0142   GLUCOSEU NEGATIVE 04/23/2016 0142   HGBUR NEGATIVE 04/23/2016 0142   BILIRUBINUR NEGATIVE 04/23/2016 0142   BILIRUBINUR neg 11/16/2014 0925   KETONESUR NEGATIVE 04/23/2016 0142   PROTEINUR NEGATIVE 04/23/2016 0142   PROTEINUR trace 11/16/2014 0925   UROBILINOGEN 0.2 11/16/2014 0925   NITRITE NEGATIVE 04/23/2016 0142   NITRITE neg 11/16/2014 0925   LEUKOCYTESUR SMALL* 04/23/2016 0142   Sepsis Labs Invalid input(s): PROCALCITONIN,  WBC,  LACTICIDVEN Microbiology No results found for this or any previous visit (from the past 240 hour(s)).   Time coordinating discharge: Over 30  minutes  SIGNED:   Debbe Odea, MD  [facility] 04/23/2016, 6:12 PM Pager   If 7PM-7AM, please contact night-coverage [email or web address] Password [email or web address]

## 2016-04-23 NOTE — H&P (Addendum)
History and Physical    Theodore Frank [id number] DOB: 1936-02-19 DOA: 04/22/2016   PCP: [name], MD Chief Complaint:  Chief Complaint  Patient presents with  . Altered Mental Status    HPI: Theodore Frank is a 80 y.o. male with medical history significant of DM, HTN.  presents to the Emergency Department for evaluation of possible altered mental status s/p intermittent episodes of uncontrolled body movements and forgetfulness with dates that onset yesterday night. Pt's neighbor explains that pt's legs began to shake uncontrollably as if he was dancing in the waiting room. Pt adds that he had an episode yesterday at approx 6 pm where he was rolling in the bed, scratching his posterior shoulders. Pt notes that he is currently back to his baseline. He states that although he has not been checking his sugar at home, he has been complaint with his . He reports that he recently took a trip to [place] where he spent a few hours (approx 4.5 hours back and forth). He denies ETOH use. He denies any dysuria, fever, vomiting, or diarrhea.  ED Course: Found to have hypoglycemia with BGL in the 40s, initially only came up to 50s with treatment, finally   Review of Systems: As per HPI otherwise 10 point review of systems negative.    Past Medical History  Diagnosis Date  . Diabetes mellitus   . Hyperlipidemia   . Hypertension   . Cancer ([place])     colon  . Prostate cancer ([place])     radiation  . Shortness of breath dyspnea     with exertion  . Arthritis   . Anemia     Past Surgical History  Procedure Laterality Date  . Colon surgery  2001    colon resection  . Inguinal hernia repair  03/05/2012    Procedure: HERNIA REPAIR INGUINAL ADULT;  Surgeon: [name], MD;  Location: [facility];  Service: General;  Laterality: Right;  Repair right inguinal hernia with mesh  . Prostate surgery    . Hernia repair Right   . Lumbar laminectomy/decompression microdiscectomy  Left 11/06/2014    Procedure: LEFT LUMBAR THREE-FOUR LAMINECTOMY/DECOMPRESSION MICRODISCECTOMY 1 LEVEL;  Surgeon: [name], MD;  Location: [facility];  Service: Neurosurgery;  Laterality: Left;     reports that he quit smoking about 50 years ago. He does not have any smokeless tobacco history on file. He reports that he does not drink alcohol or use illicit drugs.  No Known Allergies  Family History  Problem Relation Age of Onset  . Thyroid disease Sister   . Diabetes        Prior to Admission medications   Medication Sig Start Date End Date Taking? Authorizing Provider  amLODipine (NORVASC) 10 MG tablet TAKE 1 TABLET BY MOUTH DAILY Patient taking differently: TAKE 10 MG  BY MOUTH DAILY 03/29/16  Yes Mancel Bale, PA-C  atorvastatin (LIPITOR) 10 MG tablet Take 1 tablet (10 mg total) by mouth daily. 02/29/16  Yes Dorian Heckle English, PA  ferrous sulfate 325 (65 FE) MG EC tablet Take 325 mg by mouth daily with breakfast.   Yes Historical Provider, MD  glimepiride (AMARYL) 4 MG tablet Take 1 tablet (4 mg total) by mouth 2 (two) times daily. 02/29/16  Yes Stephanie D English, PA  ibuprofen (ADVIL,MOTRIN) 200 MG tablet Take 400 mg by mouth every 6 (six) hours as needed for headache, mild pain or moderate pain.   Yes Historical Provider, MD  lisinopril-hydrochlorothiazide (PRINZIDE,ZESTORETIC) 20-12.5 MG tablet TAKE 1 TABLET BY MOUTH DAILY. 03/29/16  Yes Mancel Bale, PA-C  metFORMIN (GLUCOPHAGE) 500 MG tablet Take 2 tablets (1,000 mg total) by mouth 2 (two) times daily with a meal. 02/29/16  Yes Dorian Heckle English, PA  vitamin B-12 (CYANOCOBALAMIN) 1000 MCG tablet Take 1,000 mcg by mouth daily.   Yes Historical Provider, MD    Physical Exam: Filed Vitals:   04/22/16 2133 04/23/16 0121 04/23/16 0230  BP: 138/86 141/80 127/85  Pulse: 66 48 52  Resp:  17 14  Height: 6\' 1"  (1.854 m)    Weight: 84.823 kg (187 lb)    SpO2: 97% 99% 100%      Constitutional: NAD, calm,  comfortable Eyes: PERRL, lids and conjunctivae normal ENMT: Mucous membranes are moist. Posterior pharynx clear of any exudate or lesions.Normal dentition.  Neck: normal, supple, no masses, no thyromegaly Respiratory: clear to auscultation bilaterally, no wheezing, no crackles. Normal respiratory effort. No accessory muscle use.  Cardiovascular: Regular rate and rhythm, no murmurs / rubs / gallops. No extremity edema. 2+ pedal pulses. No carotid bruits.  Abdomen: no tenderness, no masses palpated. No hepatosplenomegaly. Bowel sounds positive.  Musculoskeletal: no clubbing / cyanosis. No joint deformity upper and lower extremities. Good ROM, no contractures. Normal muscle tone.  Skin: no rashes, lesions, ulcers. No induration Neurologic: CN 2-12 grossly intact. Sensation intact, DTR normal. Strength 5/5 in all 4.  Psychiatric: Normal judgment and insight. Alert and oriented x 3. Normal mood.    Labs on Admission: I have personally reviewed following labs and imaging studies  CBC:  Recent Labs Lab 04/22/16 2313 04/22/16 2336  WBC 10.2  --   NEUTROABS 7.9*  --   HGB 13.5 13.3  HCT 39.1 39.0  MCV 89.7  --   PLT 208  --    Basic Metabolic Panel:  Recent Labs Lab 04/22/16 2313 04/22/16 2336  NA 139 138  K 3.6 7.6*  CL 107 105  CO2 25  --   GLUCOSE 49* 45*  BUN 37* 58*  CREATININE 1.53* 1.50*  CALCIUM 10.9*  --    GFR: Estimated Creatinine Clearance: 45.9 mL/min (by C-G formula based on Cr of 1.5). Liver Function Tests: No results for input(s): AST, ALT, ALKPHOS, BILITOT, PROT, ALBUMIN in the last 168 hours. No results for input(s): LIPASE, AMYLASE in the last 168 hours. No results for input(s): AMMONIA in the last 168 hours. Coagulation Profile: No results for input(s): INR, PROTIME in the last 168 hours. Cardiac Enzymes: No results for input(s): CKTOTAL, CKMB, CKMBINDEX, TROPONINI in the last 168 hours. BNP (last 3 results) No results for input(s): PROBNP in the last  8760 hours. HbA1C: No results for input(s): HGBA1C in the last 72 hours. CBG:  Recent Labs Lab 04/23/16 0116 04/23/16 0238  GLUCAP 58* 119*   Lipid Profile: No results for input(s): CHOL, HDL, LDLCALC, TRIG, CHOLHDL, LDLDIRECT in the last 72 hours. Thyroid Function Tests: No results for input(s): TSH, T4TOTAL, FREET4, T3FREE, THYROIDAB in the last 72 hours. Anemia Panel: No results for input(s): VITAMINB12, FOLATE, FERRITIN, TIBC, IRON, RETICCTPCT in the last 72 hours. Urine analysis:    Component Value Date/Time   COLORURINE YELLOW 04/23/2016 0142   APPEARANCEUR CLOUDY* 04/23/2016 0142   LABSPEC 1.016 04/23/2016 0142   PHURINE 5.0 04/23/2016 0142   GLUCOSEU NEGATIVE 04/23/2016 0142   HGBUR NEGATIVE 04/23/2016 0142   BILIRUBINUR NEGATIVE 04/23/2016 0142   BILIRUBINUR neg 11/16/2014 0925   KETONESUR NEGATIVE 04/23/2016  [place] 04/23/2016 0142   PROTEINUR trace 11/16/2014 0925   UROBILINOGEN 0.2 11/16/2014 0925   NITRITE NEGATIVE 04/23/2016 0142   NITRITE neg 11/16/2014 0925   LEUKOCYTESUR SMALL* 04/23/2016 0142   Sepsis Labs: @LABRCNTIP (procalcitonin:4,lacticidven:4) )No results found for this or any previous visit (from the past 240 hour(s)).   Radiological Exams on Admission: Dg Chest 2 View  04/23/2016  CLINICAL DATA:  80 year old male with altered mental status and shortness of breath EXAM: CHEST  2 VIEW COMPARISON:  Chest radiograph dated 12/18/2014 FINDINGS: The heart size and mediastinal contours are within normal limits. Both lungs are clear. The visualized skeletal structures are unremarkable. IMPRESSION: No active cardiopulmonary disease. Electronically Signed   By: [name] M.D.   On: 04/23/2016 02:00   Ct Head Wo Contrast  04/23/2016  CLINICAL DATA:  80 year old male with altered mental status. Anechoic shoulder body movements and forgetfulness. EXAM: CT HEAD WITHOUT CONTRAST CT CERVICAL SPINE WITHOUT CONTRAST TECHNIQUE: Multidetector  CT imaging of the head and cervical spine was performed following the standard protocol without intravenous contrast. Multiplanar CT image reconstructions of the cervical spine were also generated. COMPARISON:  Head CT dated 11/26/2010 FINDINGS: CT HEAD FINDINGS There is mild age-related atrophy and chronic microvascular ischemic changes. There is no acute intracranial hemorrhage. No mass effect or midline shift noted. The visualized paranasal sinuses and mastoid air cells are clear. The calvarium is intact. Small right occipital scalp lipoma. CT CERVICAL SPINE FINDINGS There is no acute fracture or subluxation of the cervical spine.There is osteopenia with extensive multilevel degenerative changes, endplate irregularity and disc space narrowing. There is multilevel facet hypertrophy most prominent at C2-C3, C4-C5 on the left. There is associated narrowing of the neural foramina most prominent at C3-C4 on the left.The odontoid and spinous processes are intact.There is normal anatomic alignment of the C1-C2 lateral masses. The visualized soft tissues appear unremarkable. IMPRESSION: No acute intracranial hemorrhage. Mild age-related atrophy and chronic microvascular ischemic disease. No acute/traumatic cervical spine pathology. Electronically Signed   By: [name] M.D.   On: 04/23/2016 02:18   Ct Cervical Spine Wo Contrast  04/23/2016  CLINICAL DATA:  80 year old male with altered mental status. Anechoic shoulder body movements and forgetfulness. EXAM: CT HEAD WITHOUT CONTRAST CT CERVICAL SPINE WITHOUT CONTRAST TECHNIQUE: Multidetector CT imaging of the head and cervical spine was performed following the standard protocol without intravenous contrast. Multiplanar CT image reconstructions of the cervical spine were also generated. COMPARISON:  Head CT dated 11/26/2010 FINDINGS: CT HEAD FINDINGS There is mild age-related atrophy and chronic microvascular ischemic changes. There is no acute intracranial  hemorrhage. No mass effect or midline shift noted. The visualized paranasal sinuses and mastoid air cells are clear. The calvarium is intact. Small right occipital scalp lipoma. CT CERVICAL SPINE FINDINGS There is no acute fracture or subluxation of the cervical spine.There is osteopenia with extensive multilevel degenerative changes, endplate irregularity and disc space narrowing. There is multilevel facet hypertrophy most prominent at C2-C3, C4-C5 on the left. There is associated narrowing of the neural foramina most prominent at C3-C4 on the left.The odontoid and spinous processes are intact.There is normal anatomic alignment of the C1-C2 lateral masses. The visualized soft tissues appear unremarkable. IMPRESSION: No acute intracranial hemorrhage. Mild age-related atrophy and chronic microvascular ischemic disease. No acute/traumatic cervical spine pathology. Electronically Signed   By: [name] M.D.   On: 04/23/2016 02:18    EKG: Independently reviewed.  Assessment/Plan Principal Problem:   Hypoglycemia Active  Problems:   Hypertension   Diabetes mellitus type 2, controlled, without complications (HCC)   Altered mental status   Creatinine elevation   AMS - appears resolved at the moment, suspect this was due to hypoglycemia, ?possible frontal lobe seizures with odd behaviors?  Will monitor patient overnight, also check TSH since I see he was on tapazole at some point in the past.  DM2 with Hypoglycemia -  Q2H CBG checks  D5 gtt  Holding amaryl, metformin  Creatinine above baseline - ? dehydration  Hold HCTZ - lisinopril  Recheck BMP in AM with rehydration  HTN - continue norvasc   DVT prophylaxis: Lovenox Code Status: Full Family Communication: Neighbor at bedside Consults called: None Admission status: Admit to obs   [place], [place] [facility] Pager [phone] from 7PM-7AM  If 7AM-7PM, please contact the day physician for the  patient [email or web address] Password [email or web address]  04/23/2016, 3:19 AM

## 2016-04-23 NOTE — ED Provider Notes (Signed)
CSN: [id number]     Arrival date & time 04/22/16  2128 History  By signing my name below, I, Emmanuella Mensah, attest that this documentation has been prepared under the direction and in the presence of April Palumbo, MD. Electronically Signed: Judithann Sauger, ED Scribe. 04/23/2016. 1:27 AM.     Chief Complaint  Patient presents with  . Altered Mental Status   Patient is a 80 y.o. male presenting with altered mental status. The history is provided by the patient. No language interpreter was used.  Altered Mental Status Presenting symptoms: behavior changes   Severity:  Moderate Most recent episode:  Today Episode history:  Multiple Timing:  Intermittent Progression:  Unchanged Chronicity:  New Context: not alcohol use   Associated symptoms: no abdominal pain, no depression and no vomiting    HPI Comments: Theodore Frank is a 80 y.o. male with a hx of DM, HLD, and hypertenstion who presents to the Emergency Department for evaluation of possible altered mental status s/p intermittent episodes of uncontrolled body movements and forgetfulness with dates that onset yesterday night. Pt's neighbor explains that pt's legs began to shake uncontrollably as if he was dancing in the waiting room. Pt adds that he had an episode yesterday at approx 6 pm where he was rolling in the bed, scratching his posterior shoulders. Pt notes that he is currently back to his baseline. He states that although he has not been checking his sugar at home, he has been complaint with his . He reports that he recently took a trip to [place] where he spent a few hours (approx 4.5 hours back and forth). He denies ETOH use. He denies any dysuria, fever, vomiting, or diarrhea.    Past Medical History  Diagnosis Date  . Diabetes mellitus   . Hyperlipidemia   . Hypertension   . Cancer ([place])     colon  . Prostate cancer ([place])     radiation  . Shortness of breath dyspnea     with exertion  . Arthritis   . Anemia     Past Surgical History  Procedure Laterality Date  . Colon surgery  2001    colon resection  . Inguinal hernia repair  03/05/2012    Procedure: HERNIA REPAIR INGUINAL ADULT;  Surgeon: [name], MD;  Location: [facility];  Service: General;  Laterality: Right;  Repair right inguinal hernia with mesh  . Prostate surgery    . Hernia repair Right   . Lumbar laminectomy/decompression microdiscectomy Left 11/06/2014    Procedure: LEFT LUMBAR THREE-FOUR LAMINECTOMY/DECOMPRESSION MICRODISCECTOMY 1 LEVEL;  Surgeon: [name], MD;  Location: [facility];  Service: Neurosurgery;  Laterality: Left;   No family history on file. Social History  Substance Use Topics  . Smoking status: Former Smoker    Quit date: 10/13/1965  . Smokeless tobacco: None  . Alcohol Use: No    Review of Systems  Unable to perform ROS: Mental status change  Gastrointestinal: Negative for vomiting and abdominal pain.    Allergies  Review of patient's allergies indicates no known allergies.  Home Medications   Prior to Admission medications   Medication Sig Start Date End Date Taking? Authorizing Provider  amLODipine (NORVASC) 10 MG tablet TAKE 1 TABLET BY MOUTH DAILY 03/29/16   Mancel Bale, PA-C  atorvastatin (LIPITOR) 10 MG tablet Take 1 tablet (10 mg total) by mouth daily. 02/29/16   Dorian Heckle English, PA  glimepiride (AMARYL) 4 MG tablet Take 1 tablet (  4 mg total) by mouth 2 (two) times daily. 02/29/16   Dorian Heckle English, PA  lisinopril-hydrochlorothiazide (PRINZIDE,ZESTORETIC) 20-12.5 MG tablet TAKE 1 TABLET BY MOUTH DAILY. 03/29/16   Mancel Bale, PA-C  metFORMIN (GLUCOPHAGE) 500 MG tablet Take 2 tablets (1,000 mg total) by mouth 2 (two) times daily with a meal. 02/29/16   Stephanie D English, PA   BP 138/86 mmHg  Pulse 66  Ht 6\' 1"  (1.854 m)  Wt 187 lb (84.823 kg)  BMI 24.68 kg/m2  SpO2 97% Physical Exam  Constitutional: He appears well-developed and well-nourished. No  distress.  HENT:  Head: Normocephalic and atraumatic.  Mouth/Throat: Oropharynx is clear and moist.  Eyes: Conjunctivae and EOM are normal. Pupils are equal, round, and reactive to light.  Neck: Normal range of motion. Neck supple. No tracheal deviation present.  Cardiovascular: Normal rate, regular rhythm and intact distal pulses.   Pulmonary/Chest: Effort normal and breath sounds normal. No respiratory distress. He has no wheezes. He has no rales.  Abdominal: Soft. Bowel sounds are normal. There is no tenderness. There is no rebound and no guarding.  Musculoskeletal: Normal range of motion.  Bruising on both shoulders 1.5 inch discolored bruising on right scapula, scratch on upper back, and bruise on left shoulder.   Neurological: He is alert. He has normal reflexes.  Skin: Skin is warm and dry.  Psychiatric: He has a normal mood and affect. His behavior is normal.  Nursing note and vitals reviewed.   ED Course  Procedures (including critical care time) DIAGNOSTIC STUDIES: Oxygen Saturation is 97% on RA, normal by my interpretation.    COORDINATION OF CARE: 1:20 AM- Pt advised of plan for treatment and pt agrees. Pt will receive lab work, chest x-ray, CT head, and CT cervical spine for further evaluation. He will receive dextrose 50%.    Labs Review Labs Reviewed  CBC WITH DIFFERENTIAL/PLATELET - Abnormal; Notable for the following:    Neutro Abs 7.9 (*)    All other components within normal limits  I-STAT CHEM 8, ED - Abnormal; Notable for the following:    Potassium 7.6 (*)    BUN 58 (*)    Creatinine, Ser 1.50 (*)    Glucose, Bld 45 (*)    Calcium, Ion 1.32 (*)    All other components within normal limits  URINALYSIS, ROUTINE W REFLEX MICROSCOPIC (NOT AT [facility])  BASIC METABOLIC PANEL  I-STAT TROPOININ, ED    Imaging Review No results found.   April Palumbo, MD has personally reviewed and evaluated these images and lab results as part of her medical  decision-making.   EKG Interpretation None      MDM   Final diagnoses:  None     Date: 04/23/2016  Rate: 47  Rhythm: normal sinus rhythm  QRS Axis: normal  Intervals: normal  ST/T Wave abnormalities: normal  Conduction Disutrbances: none  Narrative Interpretation: sinus bradycardia   Results for orders placed or performed during the hospital encounter of 04/22/16  CBC with Differential/Platelet  Result Value Ref Range   WBC 10.2 4.0 - 10.5 K/uL   RBC 4.36 4.22 - 5.81 MIL/uL   Hemoglobin 13.5 13.0 - 17.0 g/dL   HCT 39.1 39.0 - 52.0 %   MCV 89.7 78.0 - 100.0 fL   MCH 31.0 26.0 - 34.0 pg   MCHC 34.5 30.0 - 36.0 g/dL   RDW 12.6 11.5 - 15.5 %   Platelets 208 150 - 400 K/uL   Neutrophils Relative % 77 %  Neutro Abs 7.9 (H) 1.7 - 7.7 K/uL   Lymphocytes Relative 18 %   Lymphs Abs 1.8 0.7 - 4.0 K/uL   Monocytes Relative 4 %   Monocytes Absolute 0.4 0.1 - 1.0 K/uL   Eosinophils Relative 1 %   Eosinophils Absolute 0.1 0.0 - 0.7 K/uL   Basophils Relative 0 %   Basophils Absolute 0.0 0.0 - 0.1 K/uL  Basic metabolic panel  Result Value Ref Range   Sodium 139 135 - 145 mmol/L   Potassium 3.6 3.5 - 5.1 mmol/L   Chloride 107 101 - 111 mmol/L   CO2 25 22 - 32 mmol/L   Glucose, Bld 49 (L) 65 - 99 mg/dL   BUN 37 (H) 6 - 20 mg/dL   Creatinine, Ser 1.53 (H) 0.61 - 1.24 mg/dL   Calcium 10.9 (H) 8.9 - 10.3 mg/dL   GFR calc non Af Amer 42 (L) >60 mL/min   GFR calc Af Amer 48 (L) >60 mL/min   Anion gap 7 5 - 15  Urinalysis, Routine w reflex microscopic (not at [facility])  Result Value Ref Range   Color, Urine YELLOW YELLOW   APPearance CLOUDY (A) CLEAR   Specific Gravity, Urine 1.016 1.005 - 1.030   pH 5.0 5.0 - 8.0   Glucose, UA NEGATIVE NEGATIVE mg/dL   Hgb urine dipstick NEGATIVE NEGATIVE   Bilirubin Urine NEGATIVE NEGATIVE   Ketones, ur NEGATIVE NEGATIVE mg/dL   Protein, ur NEGATIVE NEGATIVE mg/dL   Nitrite NEGATIVE NEGATIVE   Leukocytes, UA SMALL (A) NEGATIVE  Urine  microscopic-add on  Result Value Ref Range   Squamous Epithelial / LPF 0-5 (A) NONE SEEN   WBC, UA 0-5 0 - 5 WBC/hpf   RBC / HPF 0-5 0 - 5 RBC/hpf   Bacteria, UA RARE (A) NONE SEEN   Casts HYALINE CASTS (A) NEGATIVE  I-Stat Chem 8, ED  Result Value Ref Range   Sodium 138 135 - 145 mmol/L   Potassium 7.6 (HH) 3.5 - 5.1 mmol/L   Chloride 105 101 - 111 mmol/L   BUN 58 (H) 6 - 20 mg/dL   Creatinine, Ser 1.50 (H) 0.61 - 1.24 mg/dL   Glucose, Bld 45 (L) 65 - 99 mg/dL   Calcium, Ion 1.32 (H) 1.12 - 1.23 mmol/L   TCO2 31 0 - 100 mmol/L   Hemoglobin 13.3 13.0 - 17.0 g/dL   HCT 39.0 39.0 - 52.0 %   Comment NOTIFIED PHYSICIAN   I-stat troponin, ED  Result Value Ref Range   Troponin i, poc 0.01 0.00 - 0.08 ng/mL   Comment 3          CBG monitoring, ED  Result Value Ref Range   Glucose-Capillary 58 (L) 65 - 99 mg/dL  POC CBG, ED  Result Value Ref Range   Glucose-Capillary 119 (H) 65 - 99 mg/dL   Comment 1 Notify RN    Comment 2 Document in Chart    Dg Chest 2 View  04/23/2016  CLINICAL DATA:  80 year old male with altered mental status and shortness of breath EXAM: CHEST  2 VIEW COMPARISON:  Chest radiograph dated 12/18/2014 FINDINGS: The heart size and mediastinal contours are within normal limits. Both lungs are clear. The visualized skeletal structures are unremarkable. IMPRESSION: No active cardiopulmonary disease. Electronically Signed   By: [name] M.D.   On: 04/23/2016 02:00   Ct Head Wo Contrast  04/23/2016  CLINICAL DATA:  80 year old male with altered mental status. Anechoic shoulder body movements and  forgetfulness. EXAM: CT HEAD WITHOUT CONTRAST CT CERVICAL SPINE WITHOUT CONTRAST TECHNIQUE: Multidetector CT imaging of the head and cervical spine was performed following the standard protocol without intravenous contrast. Multiplanar CT image reconstructions of the cervical spine were also generated. COMPARISON:  Head CT dated 11/26/2010 FINDINGS: CT HEAD FINDINGS There is  mild age-related atrophy and chronic microvascular ischemic changes. There is no acute intracranial hemorrhage. No mass effect or midline shift noted. The visualized paranasal sinuses and mastoid air cells are clear. The calvarium is intact. Small right occipital scalp lipoma. CT CERVICAL SPINE FINDINGS There is no acute fracture or subluxation of the cervical spine.There is osteopenia with extensive multilevel degenerative changes, endplate irregularity and disc space narrowing. There is multilevel facet hypertrophy most prominent at C2-C3, C4-C5 on the left. There is associated narrowing of the neural foramina most prominent at C3-C4 on the left.The odontoid and spinous processes are intact.There is normal anatomic alignment of the C1-C2 lateral masses. The visualized soft tissues appear unremarkable. IMPRESSION: No acute intracranial hemorrhage. Mild age-related atrophy and chronic microvascular ischemic disease. No acute/traumatic cervical spine pathology. Electronically Signed   By: [name] M.D.   On: 04/23/2016 02:18   Ct Cervical Spine Wo Contrast  04/23/2016  CLINICAL DATA:  80 year old male with altered mental status. Anechoic shoulder body movements and forgetfulness. EXAM: CT HEAD WITHOUT CONTRAST CT CERVICAL SPINE WITHOUT CONTRAST TECHNIQUE: Multidetector CT imaging of the head and cervical spine was performed following the standard protocol without intravenous contrast. Multiplanar CT image reconstructions of the cervical spine were also generated. COMPARISON:  Head CT dated 11/26/2010 FINDINGS: CT HEAD FINDINGS There is mild age-related atrophy and chronic microvascular ischemic changes. There is no acute intracranial hemorrhage. No mass effect or midline shift noted. The visualized paranasal sinuses and mastoid air cells are clear. The calvarium is intact. Small right occipital scalp lipoma. CT CERVICAL SPINE FINDINGS There is no acute fracture or subluxation of the cervical spine.There is  osteopenia with extensive multilevel degenerative changes, endplate irregularity and disc space narrowing. There is multilevel facet hypertrophy most prominent at C2-C3, C4-C5 on the left. There is associated narrowing of the neural foramina most prominent at C3-C4 on the left.The odontoid and spinous processes are intact.There is normal anatomic alignment of the C1-C2 lateral masses. The visualized soft tissues appear unremarkable. IMPRESSION: No acute intracranial hemorrhage. Mild age-related atrophy and chronic microvascular ischemic disease. No acute/traumatic cervical spine pathology. Electronically Signed   By: [name] M.D.   On: 04/23/2016 02:18    Will admit for AMS on sulfonurea    I personally performed the services described in this documentation, which was scribed in my presence. The recorded information has been reviewed and is accurate.     Veatrice Kells, MD 04/23/16 [phone]

## 2016-04-25 ENCOUNTER — Ambulatory Visit (INDEPENDENT_AMBULATORY_CARE_PROVIDER_SITE_OTHER): Payer: Medicare Other | Admitting: Physician Assistant

## 2016-04-25 VITALS — BP 126/88 | HR 77 | Temp 98.0°F | Resp 18 | Ht 73.0 in | Wt 179.0 lb

## 2016-04-25 DIAGNOSIS — R569 Unspecified convulsions: Secondary | ICD-10-CM

## 2016-04-25 DIAGNOSIS — Z09 Encounter for follow-up examination after completed treatment for conditions other than malignant neoplasm: Secondary | ICD-10-CM

## 2016-04-25 LAB — BASIC METABOLIC PANEL
BUN: 25 mg/dL (ref 7–25)
CO2: 25 mmol/L (ref 20–31)
Calcium: 10.4 mg/dL — ABNORMAL HIGH (ref 8.6–10.3)
Chloride: 106 mmol/L (ref 98–110)
Creat: 1.48 mg/dL — ABNORMAL HIGH (ref 0.70–1.18)
Glucose, Bld: 126 mg/dL — ABNORMAL HIGH (ref 65–99)
Potassium: 3.9 mmol/L (ref 3.5–5.3)
Sodium: 140 mmol/L (ref 135–146)

## 2016-04-25 NOTE — Progress Notes (Signed)
[facility] [address], [place] [address] [phone]  Date:  04/25/2016   Name:  Theodore Frank   DOB:  04/16/1936   MRN:  [id number]  PCP:  [name], MD    History of Present Illness:  Theodore Frank is a 80 y.o. male patient who presents to [facility] for follow-up of hospitalization. Patient was seen at the ED for evaluation of altered mental status with uncontrolled body movements and forgetfulness that started the day before ED visit, 3 days ago.  At the time of visit, his symptoms had resolved. Diagnosis was not definitive with differential including possible hypoglycemia, frontal lobe seizures.  Medication changed at the ED with glimepiride from 4 mg twice daily to 4 mg daily.  Patient states that his symptoms have totally resolved.  He has no dizziness, or uncontrolled body movements.  Denies any confusion, head pain, numbness or tingling, weakness, abdominal pain, nausea, or chest pains.  No vision changes.   He has been checking his glucose more (he was not checking this despite on diabetic medication).  Morning glucose 102, 103.  BP at home yesterday was 132/77, pulse 86.    Patient Active Problem List   Diagnosis Date Noted  . Hypoglycemia 04/23/2016  . Acute encephalopathy 04/23/2016  . Chronic kidney disease, stage 3 04/23/2016  . HNP (herniated nucleus pulposus), lumbar 11/06/2014  . Malignant neoplasm of prostate ([place]) 04/05/2013  . S/P right inguinal hernia repair 04/02/2012  . Right inguinal hernia 02/25/2012  . Hypertension 01/30/2012  . Hyperthyroidism 01/30/2012  . Diabetes mellitus type 2, controlled, without complications ([place]) 0000000  . Hyperlipidemia 01/30/2012    Past Medical History  Diagnosis Date  . Diabetes mellitus   . Hyperlipidemia   . Hypertension   . Cancer ([place])     colon  . Prostate cancer ([place])     radiation  . Shortness of breath dyspnea     with exertion  . Arthritis   . Anemia     Past Surgical  History  Procedure Laterality Date  . Colon surgery  2001    colon resection  . Inguinal hernia repair  03/05/2012    Procedure: HERNIA REPAIR INGUINAL ADULT;  Surgeon: [name], MD;  Location: [place];  Service: General;  Laterality: Right;  Repair right inguinal hernia with mesh  . Prostate surgery    . Hernia repair Right   . Lumbar laminectomy/decompression microdiscectomy Left 11/06/2014    Procedure: LEFT LUMBAR THREE-FOUR LAMINECTOMY/DECOMPRESSION MICRODISCECTOMY 1 LEVEL;  Surgeon: [name], MD;  Location: [place] [facility];  Service: Neurosurgery;  Laterality: Left;    Social History  Substance Use Topics  . Smoking status: Former Smoker    Quit date: 10/13/1965  . Smokeless tobacco: None  . Alcohol Use: No    Family History  Problem Relation Age of Onset  . Thyroid disease Sister   . Diabetes      No Known Allergies  Medication list has been reviewed and updated.  Current Outpatient Prescriptions on File Prior to Visit  Medication Sig Dispense Refill  . amLODipine (NORVASC) 10 MG tablet TAKE 1 TABLET BY MOUTH DAILY (Patient taking differently: TAKE 10 MG  BY MOUTH DAILY) 30 tablet 0  . atorvastatin (LIPITOR) 10 MG tablet Take 1 tablet (10 mg total) by mouth daily. 90 tablet 1  . ferrous sulfate 325 (65 FE) MG EC tablet Take 325 mg by mouth daily with breakfast.    .  glimepiride (AMARYL) 4 MG tablet Take 1 tablet (4 mg total) by mouth daily with breakfast. 180 tablet 1  . ibuprofen (ADVIL,MOTRIN) 200 MG tablet Take 400 mg by mouth every 6 (six) hours as needed for headache, mild pain or moderate pain.    Marland Kitchen lisinopril-hydrochlorothiazide (PRINZIDE,ZESTORETIC) 20-12.5 MG tablet TAKE 1 TABLET BY MOUTH DAILY. 30 tablet 4  . metFORMIN (GLUCOPHAGE) 500 MG tablet Take 2 tablets (1,000 mg total) by mouth 2 (two) times daily with a meal. 360 tablet 1  . vitamin B-12 (CYANOCOBALAMIN) 1000 MCG tablet Take 1,000 mcg by mouth daily.     No current  facility-administered medications on file prior to visit.    ROS ROS otherwise unremarkable unless listed above.   Physical Examination: BP 126/88 mmHg  Pulse 77  Temp(Src) 98 F (36.7 C) (Oral)  Resp 18  Ht 6\' 1"  (1.854 m)  Wt 179 lb (81.194 kg)  BMI 23.62 kg/m2  SpO2 97% Ideal Body Weight: Weight in (lb) to have BMI = 25: 189.1  Physical Exam  Constitutional: He is oriented to person, place, and time. He appears well-developed and well-nourished. No distress.  HENT:  Head: Normocephalic and atraumatic.  Eyes: Conjunctivae and EOM are normal. Pupils are equal, round, and reactive to light.  Cardiovascular: Normal rate and regular rhythm.  Exam reveals no gallop and no friction rub.   No murmur heard. Pulses:      Carotid pulses are 2+ on the right side, and 2+ on the left side.      Dorsalis pedis pulses are 2+ on the right side, and 2+ on the left side.  Pulmonary/Chest: Effort normal. No respiratory distress. He has no decreased breath sounds. He has no wheezes. He has no rhonchi.  Neurological: He is alert and oriented to person, place, and time. He has normal strength. He is not disoriented. He displays no tremor. He displays a negative Romberg sign. He displays no seizure activity. Coordination and gait normal.  Reflex Scores:      Patellar reflexes are 2+ on the right side and 2+ on the left side. Normal rapid movement of upper and lower extremity.  Skin: Skin is warm and dry. He is not diaphoretic.  Psychiatric: He has a normal mood and affect. His behavior is normal.    Assessment and Plan: Theodore Frank is a 80 y.o. male who is here today for hospitalization follow up. -symptoms resolved.  Likely symptoms from the hypoglycemia, however possible seizure should be ruled out. Advised him to start rechecking glucose, and symptomatically as well.  bmet re-evaluated. Follow up - Plan: Basic metabolic panel, Ambulatory referral to Neurology  Seizure-like activity [facility]) -  Plan: Ambulatory referral to Neurology  Ivar Drape, PA-C [facility] 7/14/20177:38 PM

## 2016-04-25 NOTE — Patient Instructions (Addendum)
     IF you received an x-ray today, you will receive an invoice from [facility]. Please contact [facility] at [phone] with questions or concerns regarding your invoice.   IF you received labwork today, you will receive an invoice from [employer]. Please contact [facility] at [phone] with questions or concerns regarding your invoice.   Our billing staff will not be able to assist you with questions regarding bills from these companies.  You will be contacted with the lab results as soon as they are available. The fastest way to get your results is to activate your My Chart account. Instructions are located on the last page of this paperwork. If you have not heard from [place] regarding the results in 2 weeks, please contact this office.    Please continue to take the glucose medicine as assigned by the emergency department.  Continue to check your blood glucose.  You will return in 1 month-6 weeks for diabetes follow up. Please await consult from the neurologist.  This did not sound like a seizure, but it must be ruled out by the neurologist.  It would be best that you do not drive until you are cleared by the neurologist. The contact information for exercise that we discussed is called [employer].

## 2016-04-28 LAB — POCT I-STAT, CHEM 8
BUN: 58 mg/dL — ABNORMAL HIGH (ref 6–20)
Calcium, Ion: 1.32 mmol/L — ABNORMAL HIGH (ref 1.12–1.23)
Chloride: 105 mmol/L (ref 101–111)
Creatinine, Ser: 1.5 mg/dL — ABNORMAL HIGH (ref 0.61–1.24)
Glucose, Bld: 45 mg/dL — ABNORMAL LOW (ref 65–99)
HCT: 39 % (ref 39.0–52.0)
Hemoglobin: 13.3 g/dL (ref 13.0–17.0)
Potassium: 7.6 mmol/L (ref 3.5–5.1)
Sodium: 138 mmol/L (ref 135–145)
TCO2: 31 mmol/L (ref 0–100)

## 2016-04-29 IMAGING — CR DG CHEST 2V
3 series · 3 of 3 positions shown · non-contrast
Comparison: 11/30/2014

CLINICAL DATA: Lung mass

EXAM:
CHEST  2 VIEW

[PA (1 of 2)]
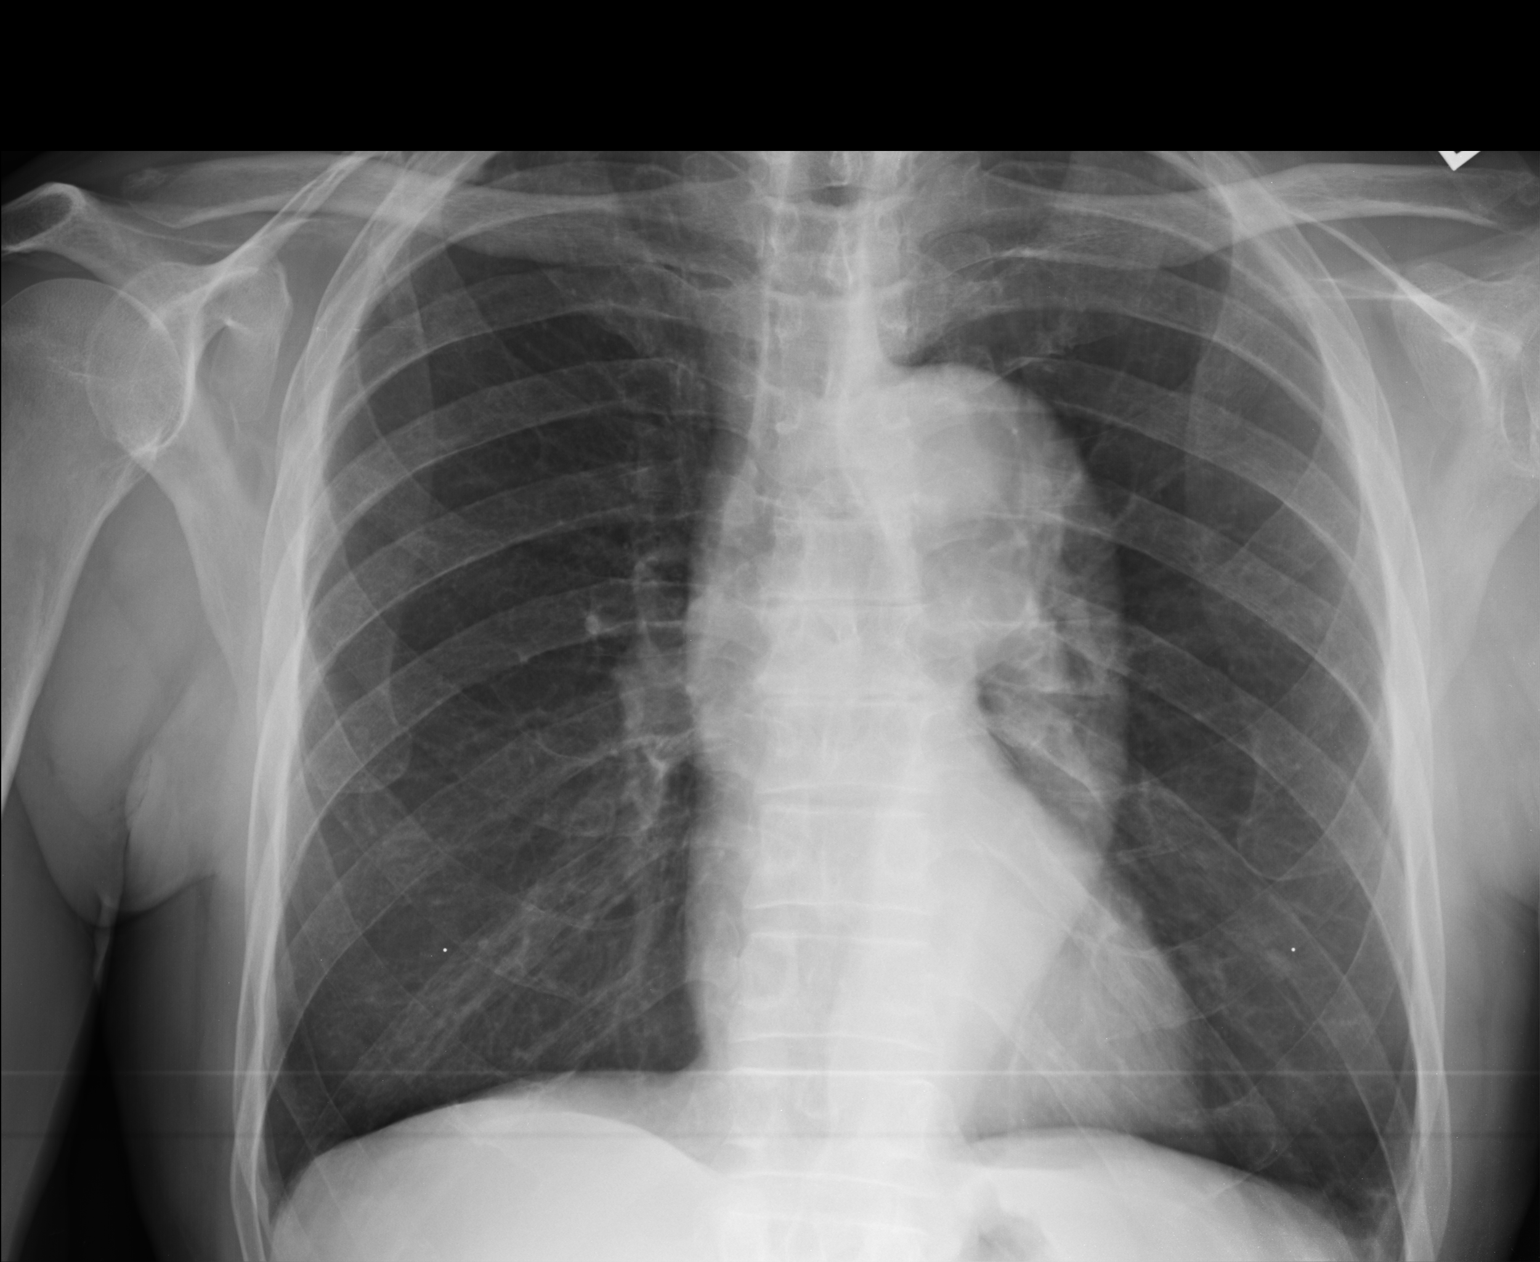

[lateral]
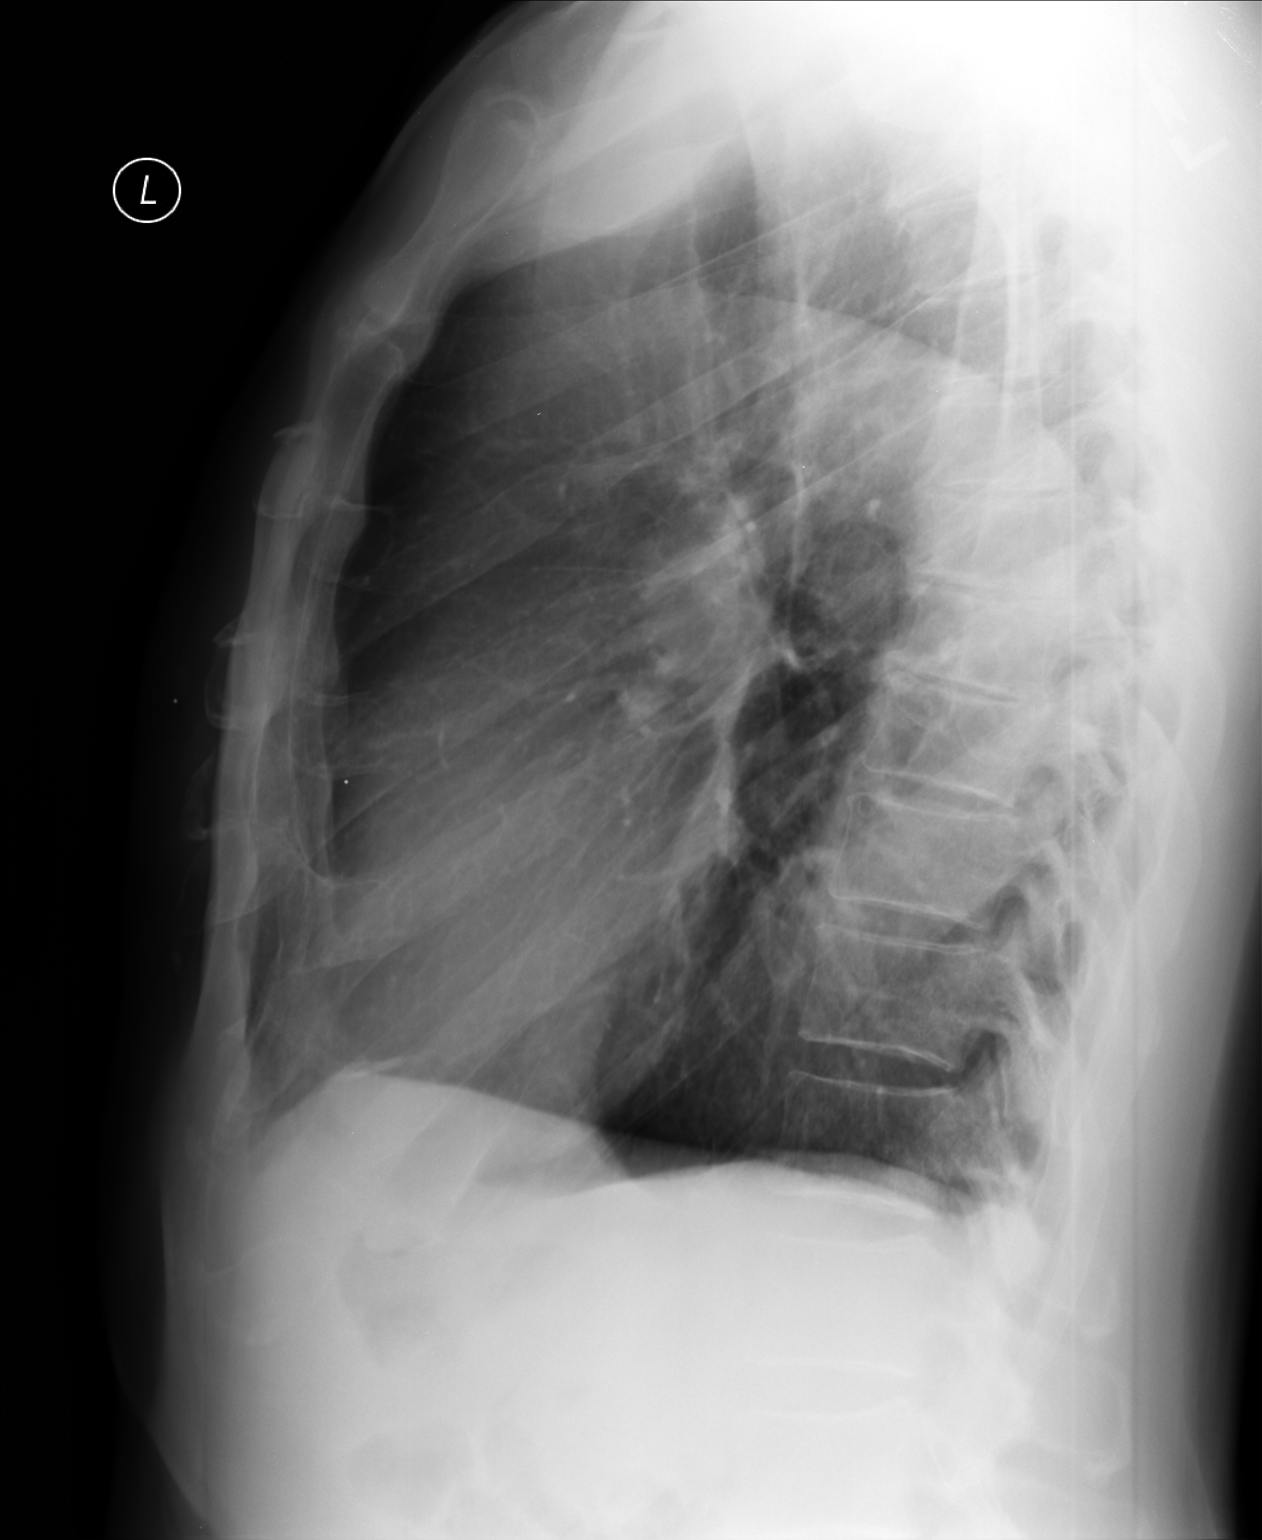

[PA (2 of 2)]
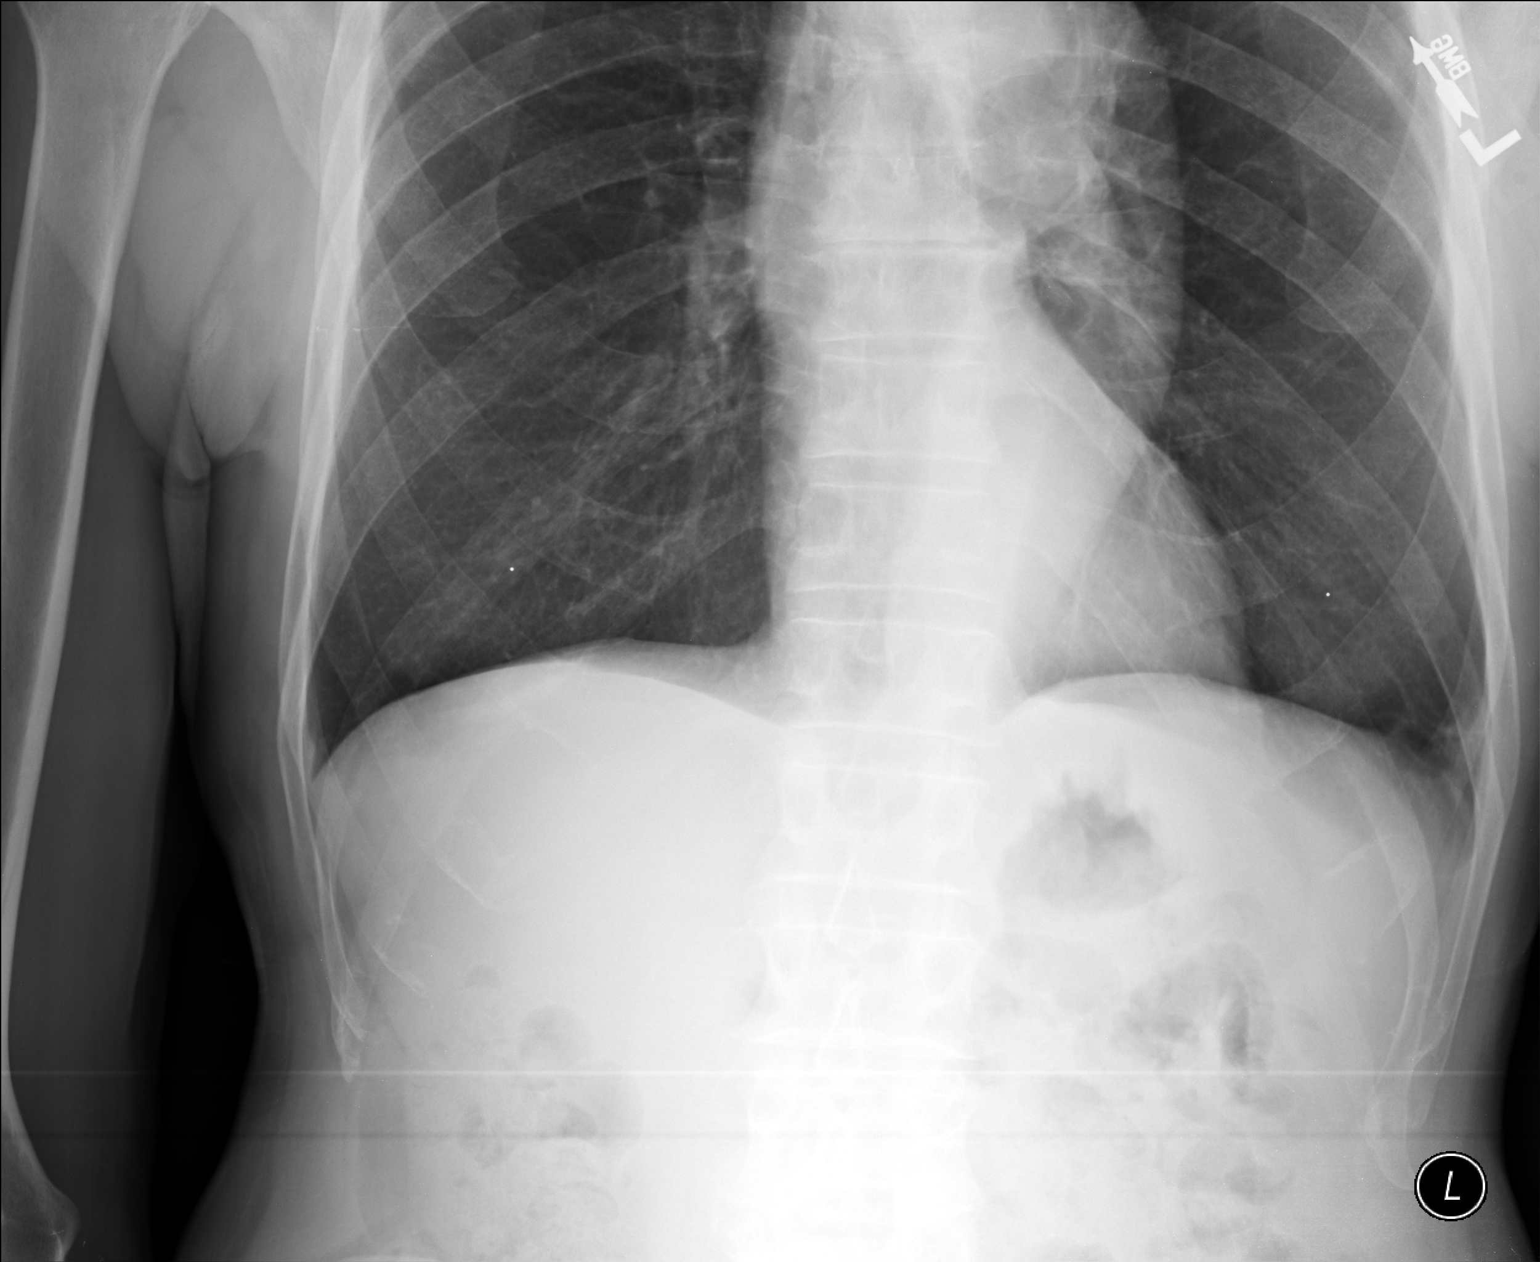

[3 of 3 positions shown; findings below may reference images not displayed]

FINDINGS: Nipple markers are present. The left nipple marker corresponds with
the left nipple shadow seen on the prior exam. There is no focal
parenchymal opacity, pleural effusion, or pneumothorax. The heart
and mediastinal contours are unremarkable.

The osseous structures are unremarkable.
IMPRESSION: No active cardiopulmonary disease.

## 2016-05-02 ENCOUNTER — Ambulatory Visit (INDEPENDENT_AMBULATORY_CARE_PROVIDER_SITE_OTHER): Payer: Medicare Other | Admitting: Neurology

## 2016-05-02 ENCOUNTER — Encounter: Payer: Self-pay | Admitting: Neurology

## 2016-05-02 VITALS — BP 138/86 | HR 61 | Ht 73.5 in | Wt 179.0 lb

## 2016-05-02 DIAGNOSIS — G9340 Encephalopathy, unspecified: Secondary | ICD-10-CM | POA: Diagnosis not present

## 2016-05-02 NOTE — Progress Notes (Signed)
Reason for visit: Transient confusion  Referring physician: Dr. Raul Del Feliz is a 80 y.o. male  History of present illness:  [name] is a 80 year old right-handed black male with a history of diabetes. The patient began having episodes of confusion, flailing of the extremities that occurred on 3 occasions on the evening of 04/22/2016. The patient has spotty recollection of events during the episodes. The patient eventually was taken to the emergency room by a neighbor after he told her that he was feeling bad and confused. The patient was noted to have low blood sugars, 3 separate checks showed blood sugars in the 45-49 range. The patient was given an adjustment of his diabetes medications, he has felt quite well since that time. There have been no recurrences. The patient has reported no numbness or weakness on the face, arms, or legs. A CT scan of brain was done, this was unremarkable. The patient denies any balance issues. There have been no other episodes of loss of consciousness or confusion. He is sent to this office for further evaluation.  Past Medical History  Diagnosis Date  . Diabetes mellitus   . Hyperlipidemia   . Hypertension   . Cancer ([place])     colon  . Prostate cancer ([place])     radiation  . Shortness of breath dyspnea     with exertion  . Arthritis   . Anemia     Past Surgical History  Procedure Laterality Date  . Colon surgery  2001    colon resection  . Inguinal hernia repair  03/05/2012    Procedure: HERNIA REPAIR INGUINAL ADULT;  Surgeon: [name], MD;  Location: [place];  Service: General;  Laterality: Right;  Repair right inguinal hernia with mesh  . Prostate surgery    . Hernia repair Right   . Lumbar laminectomy/decompression microdiscectomy Left 11/06/2014    Procedure: LEFT LUMBAR THREE-FOUR LAMINECTOMY/DECOMPRESSION MICRODISCECTOMY 1 LEVEL;  Surgeon: [name], MD;  Location: [place] [facility];  Service:  Neurosurgery;  Laterality: Left;    Family History  Problem Relation Age of Onset  . Thyroid disease Sister   . Diabetes      Social history:  reports that he quit smoking about 50 years ago. He does not have any smokeless tobacco history on file. He reports that he does not drink alcohol or use illicit drugs.  Medications:  Prior to Admission medications   Medication Sig Start Date End Date Taking? Authorizing Provider  amLODipine (NORVASC) 10 MG tablet TAKE 1 TABLET BY MOUTH DAILY Patient taking differently: TAKE 10 MG  BY MOUTH DAILY 03/29/16  Yes Mancel Bale, PA-C  atorvastatin (LIPITOR) 10 MG tablet Take 1 tablet (10 mg total) by mouth daily. 02/29/16  Yes Dorian Heckle English, PA  ferrous sulfate 325 (65 FE) MG EC tablet Take 325 mg by mouth daily with breakfast.   Yes Historical Provider, MD  glimepiride (AMARYL) 4 MG tablet Take 1 tablet (4 mg total) by mouth daily with breakfast. 04/23/16  Yes Debbe Odea, MD  ibuprofen (ADVIL,MOTRIN) 200 MG tablet Take 400 mg by mouth every 6 (six) hours as needed for headache, mild pain or moderate pain.   Yes Historical Provider, MD  lisinopril-hydrochlorothiazide (PRINZIDE,ZESTORETIC) 20-12.5 MG tablet TAKE 1 TABLET BY MOUTH DAILY. 03/29/16  Yes Mancel Bale, PA-C  metFORMIN (GLUCOPHAGE) 500 MG tablet Take 2 tablets (1,000 mg total) by mouth 2 (two) times daily with a meal. 02/29/16  Yes Dorian Heckle English, PA  vitamin B-12 (CYANOCOBALAMIN) 1000 MCG tablet Take 1,000 mcg by mouth daily.   Yes Historical Provider, MD     No Known Allergies  ROS:  Out of a complete 14 system review of symptoms, the patient complains only of the following symptoms, and all other reviewed systems are negative.  Feeling hot Ringing in the ears  Blood pressure 138/86, pulse 61, height 6' 1.5" (1.867 m), weight 179 lb (81.194 kg).  Physical Exam  General: The patient is alert and cooperative at the time of the examination.  Eyes: Pupils are equal, round,  and reactive to light. Discs are flat bilaterally.  Neck: The neck is supple, no carotid bruits are noted.  Respiratory: The respiratory examination is clear.  Cardiovascular: The cardiovascular examination reveals a regular rate and rhythm, no obvious murmurs or rubs are noted.  Skin: Extremities are without significant edema.  Neurologic Exam  Mental status: The patient is alert and oriented x 3 at the time of the examination. The patient has apparent normal recent and remote memory, with an apparently normal attention span and concentration ability.  Cranial nerves: Facial symmetry is present. There is good sensation of the face to pinprick and soft touch bilaterally. The strength of the facial muscles and the muscles to head turning and shoulder shrug are normal bilaterally. Speech is well enunciated, no aphasia or dysarthria is noted. Extraocular movements are full. Visual fields are full. The tongue is midline, and the patient has symmetric elevation of the soft palate. No obvious hearing deficits are noted.  Motor: The motor testing reveals 5 over 5 strength of all 4 extremities. Good symmetric motor tone is noted throughout.  Sensory: Sensory testing is intact to pinprick, soft touch, vibration sensation, and position sense on all 4 extremities. No evidence of extinction is noted.  Coordination: Cerebellar testing reveals good finger-nose-finger and heel-to-shin bilaterally.  Gait and station: Gait is normal. Tandem gait is normal. Romberg is negative. No drift is seen.  Reflexes: Deep tendon reflexes are symmetric, but are depressed bilaterally. Toes are downgoing bilaterally.   CT head 04/23/16:  IMPRESSION: No acute intracranial hemorrhage.  Mild age-related atrophy and chronic microvascular ischemic disease.  No acute/traumatic cervical spine pathology.  * CT scan images were reviewed online. I agree with the written report.    Assessment/Plan:  1. Transient  confusion associated with hypoglycemia  The patient has had episodes of hypoglycemia associated with confusion, flailing events. The patient has done much better since the diabetic medications have been readjusted. The patient was not checking his blood sugars on a regular basis. There is no indication for further neurologic workup at this time, if the patient has unexplained confusional events, he is to contact our office and we will pursue further neurologic evaluation. The patient will follow-up on an as-needed basis.  Jill Alexanders MD 05/02/2016 3:28 PM  [facility] [address] [place], [place] [address]  Phone [phone] Fax [phone]

## 2016-05-06 DIAGNOSIS — M1990 Unspecified osteoarthritis, unspecified site: Secondary | ICD-10-CM | POA: Insufficient documentation

## 2016-05-06 DIAGNOSIS — D649 Anemia, unspecified: Secondary | ICD-10-CM | POA: Insufficient documentation

## 2016-05-28 ENCOUNTER — Ambulatory Visit (INDEPENDENT_AMBULATORY_CARE_PROVIDER_SITE_OTHER): Payer: Medicare Other | Admitting: Urgent Care

## 2016-05-28 VITALS — BP 124/82 | HR 78 | Temp 98.0°F | Resp 17 | Ht 73.0 in | Wt 181.0 lb

## 2016-05-28 DIAGNOSIS — E119 Type 2 diabetes mellitus without complications: Secondary | ICD-10-CM

## 2016-05-28 DIAGNOSIS — E162 Hypoglycemia, unspecified: Secondary | ICD-10-CM

## 2016-05-28 LAB — POCT GLYCOSYLATED HEMOGLOBIN (HGB A1C): Hemoglobin A1C: 5.4

## 2016-05-28 LAB — GLUCOSE, POCT (MANUAL RESULT ENTRY): POC Glucose: 189 mg/dl — AB (ref 70–99)

## 2016-05-28 MED ORDER — METFORMIN HCL 500 MG PO TABS: 500.0000 mg | ORAL_TABLET | Freq: Every day | ORAL | 1 refills | Status: DC

## 2016-05-28 NOTE — Patient Instructions (Addendum)
Hypoglycemia Hypoglycemia occurs when the glucose in your blood is too low. Glucose is a type of sugar that is your body's main energy source. Hormones, such as insulin and glucagon, control the level of glucose in the blood. Insulin lowers blood glucose and glucagon increases blood glucose. Having too much insulin in your blood stream, or not eating enough food containing sugar, can result in hypoglycemia. Hypoglycemia can happen to people with or without diabetes. It can develop quickly and can be a medical emergency.  CAUSES   Missing or delaying meals.  Not eating enough carbohydrates at meals.  Taking too much diabetes medicine.  Not timing your oral diabetes medicine or insulin doses with meals, snacks, and exercise.  Nausea and vomiting.  Certain medicines.  Severe illnesses, such as hepatitis, kidney disorders, and certain eating disorders.  Increased activity or exercise without eating something extra or adjusting medicines.  Drinking too much alcohol.  A nerve disorder that affects body functions like your heart rate, blood pressure, and digestion (autonomic neuropathy).  A condition where the stomach muscles do not function properly (gastroparesis). Therefore, medicines and food may not absorb properly.  Rarely, a tumor of the pancreas can produce too much insulin. SYMPTOMS   Hunger.  Sweating (diaphoresis).  Change in body temperature.  Shakiness.  Headache.  Anxiety.  Lightheadedness.  Irritability.  Difficulty concentrating.  Dry mouth.  Tingling or numbness in the hands or feet.  Restless sleep or sleep disturbances.  Altered speech and coordination.  Change in mental status.  Seizures or prolonged convulsions.  Combativeness.  Drowsiness (lethargic).  Weakness.  Increased heart rate or palpitations.  Confusion.  Pale, gray skin color.  Blurred or double vision.  Fainting. DIAGNOSIS  A physical exam and medical history will be  performed. Your caregiver may make a diagnosis based on your symptoms. Blood tests and other lab tests may be performed to confirm a diagnosis. Once the diagnosis is made, your caregiver will see if your signs and symptoms go away once your blood glucose is raised.  TREATMENT  Usually, you can easily treat your hypoglycemia when you notice symptoms.  Check your blood glucose. If it is less than 70 mg/dl, take one of the following:   3-4 glucose tablets.    cup juice.    cup regular soda.   1 cup skim milk.   -1 tube of glucose gel.   5-6 hard candies.   Avoid high-fat drinks or food that may delay a rise in blood glucose levels.  Do not take more than the recommended amount of sugary foods, drinks, gel, or tablets. Doing so will cause your blood glucose to go too high.   Wait 10-15 minutes and recheck your blood glucose. If it is still less than 70 mg/dl or below your target range, repeat treatment.   Eat a snack if it is more than 1 hour until your next meal.  There may be a time when your blood glucose may go so low that you are unable to treat yourself at home when you start to notice symptoms. You may need someone to help you. You may even faint or be unable to swallow. If you cannot treat yourself, someone will need to bring you to the hospital.  HOME CARE INSTRUCTIONS  If you have diabetes, follow your diabetes management plan by:  Taking your medicines as directed.  Following your exercise plan.  Following your meal plan. Do not skip meals. Eat on time.  Testing your blood   glucose regularly. Check your blood glucose before and after exercise. If you exercise longer or different than usual, be sure to check blood glucose more frequently.  Wearing your medical alert jewelry that says you have diabetes.  Identify the cause of your hypoglycemia. Then, develop ways to prevent the recurrence of hypoglycemia.  Do not take a hot bath or shower right after an  insulin shot.  Always carry treatment with you. Glucose tablets are the easiest to carry.  If you are going to drink alcohol, drink it only with meals.  Tell friends or family members ways to keep you safe during a seizure. This may include removing hard or sharp objects from the area or turning you on your side.  Maintain a healthy weight. SEEK MEDICAL CARE IF:   You are having problems keeping your blood glucose in your target range.  You are having frequent episodes of hypoglycemia.  You feel you might be having side effects from your medicines.  You are not sure why your blood glucose is dropping so low.  You notice a change in vision or a new problem with your vision. SEEK IMMEDIATE MEDICAL CARE IF:   Confusion develops.  A change in mental status occurs.  The inability to swallow develops.  Fainting occurs.   This information is not intended to replace advice given to you by your health care provider. Make sure you discuss any questions you have with your health care provider.   Document Released: 09/29/2005 Document Revised: 10/04/2013 Document Reviewed: 06/05/2015 Elsevier Interactive Patient Education 2016 [employer].     IF you received an x-ray today, you will receive an invoice from [facility]. Please contact [facility] at [phone] with questions or concerns regarding your invoice.   IF you received labwork today, you will receive an invoice from [employer]. Please contact [facility] at [phone] with questions or concerns regarding your invoice.   Our billing staff will not be able to assist you with questions regarding bills from these companies.  You will be contacted with the lab results as soon as they are available. The fastest way to get your results is to activate your My Chart account. Instructions are located on the last page of this paperwork. If you have not heard from [place] regarding the results  in 2 weeks, please contact this office.

## 2016-05-28 NOTE — Progress Notes (Signed)
    MRN: [id number] DOB: August 30, 1936  Subjective:   Theodore Frank is a 80 y.o. male presenting for chief complaint of Diabetes (Having issues with low blood sugar) and Fatigue  Patient was diagnosed with Diabetes Type 2 in 2013, currently managed with Metformin 2 pills twice daily, Amaryl once daily. Patient checks his fasting blood sugar and has had major fluctuations between 50-300. At times patient becomes sweaty, weak during his low blood sugar episodes. Patient was also hospitalized in July for a hypoglycemia. Admits nocturia but denies straining, weakened stream, hematuria, dysuria. Denies syncope, chills, nausea, vomiting, numbness or tingling, chest pain.  Theodore Frank has a current medication list which includes the following prescription(s): amlodipine, atorvastatin, ferrous sulfate, glimepiride, ibuprofen, lisinopril-hydrochlorothiazide, metformin, and vitamin b-12. Also has No Known Allergies.  Theodore Frank  has a past medical history of Anemia; Arthritis; Cancer ([place]); Diabetes mellitus; Hyperlipidemia; Hypertension; Prostate cancer ([place]); and Shortness of breath dyspnea. Also  has a past surgical history that includes Colon surgery (2001); Inguinal hernia repair (03/05/2012); ostate surgery; Hernia repair (Right); and Lumbar laminectomy/decompression microdiscectomy (Left, 11/06/2014).  Objective:   Vitals: BP 124/82 (BP Location: Right Arm, Patient Position: Sitting, Cuff Size: Normal)   Pulse 78   Temp 98 F (36.7 C) (Oral)   Resp 17   Ht 6\' 1"  (1.854 m)   Wt 181 lb (82.1 kg)   SpO2 97%   BMI 23.88 kg/m   Physical Exam  Constitutional: He is oriented to person, place, and time. He appears well-developed and well-nourished.  HENT:  Mouth/Throat: Oropharynx is clear and moist.  Cardiovascular: Normal rate, regular rhythm and intact distal pulses.  Exam reveals no gallop and no friction rub.   No murmur heard. Pulmonary/Chest: No respiratory distress. He has no wheezes. He has no rales.    Musculoskeletal: He exhibits no edema.  Neurological: He is alert and oriented to person, place, and time.  Skin: Skin is warm and dry.   Results for orders placed or performed in visit on 05/28/16 (from the past 24 hour(s))  POCT glucose (manual entry)     Status: Abnormal   Collection Time: 05/28/16 12:38 PM  Result Value Ref Range   POC Glucose 189 (A) 70 - 99 mg/dl    Assessment and Plan :   1. Controlled type 2 diabetes mellitus without complication, unspecified long term insulin use status ([place]) 2. Hypoglycemia - Patient will stop Amaryl, cut down Metformin to once daily with breakfast. Counseled on diabetes diet. Recheck in 6 months.  Theodore Eagles, PA-C [facility] [phone] 05/28/2016 12:13 PM

## 2016-09-12 ENCOUNTER — Other Ambulatory Visit: Payer: Self-pay | Admitting: Physician Assistant

## 2016-09-12 DIAGNOSIS — I10 Essential (primary) hypertension: Secondary | ICD-10-CM

## 2016-09-12 DIAGNOSIS — E119 Type 2 diabetes mellitus without complications: Secondary | ICD-10-CM

## 2016-09-14 NOTE — Telephone Encounter (Signed)
05/2016 lat ov 04/2016 last lab

## 2016-09-17 ENCOUNTER — Other Ambulatory Visit: Payer: Self-pay

## 2016-09-17 DIAGNOSIS — I10 Essential (primary) hypertension: Secondary | ICD-10-CM

## 2016-09-17 DIAGNOSIS — E119 Type 2 diabetes mellitus without complications: Secondary | ICD-10-CM

## 2016-09-17 MED ORDER — GLIMEPIRIDE 4 MG PO TABS: 4.0000 mg | ORAL_TABLET | Freq: Two times a day (BID) | ORAL | 0 refills | Status: DC

## 2016-09-17 MED ORDER — AMLODIPINE BESYLATE 10 MG PO TABS: 10.0000 mg | ORAL_TABLET | Freq: Every day | ORAL | 0 refills | Status: DC

## 2016-09-17 MED ORDER — LISINOPRIL-HYDROCHLOROTHIAZIDE 20-12.5 MG PO TABS: 1.0000 | ORAL_TABLET | Freq: Every day | ORAL | 0 refills | Status: DC

## 2016-11-13 ENCOUNTER — Ambulatory Visit (INDEPENDENT_AMBULATORY_CARE_PROVIDER_SITE_OTHER): Payer: Medicare Other | Admitting: Family Medicine

## 2016-11-13 VITALS — BP 132/90 | HR 78 | Temp 98.0°F | Resp 16 | Ht 73.0 in | Wt 187.0 lb

## 2016-11-13 DIAGNOSIS — N183 Chronic kidney disease, stage 3 unspecified: Secondary | ICD-10-CM

## 2016-11-13 DIAGNOSIS — E7800 Pure hypercholesterolemia, unspecified: Secondary | ICD-10-CM | POA: Diagnosis not present

## 2016-11-13 DIAGNOSIS — Z85038 Personal history of other malignant neoplasm of large intestine: Secondary | ICD-10-CM | POA: Diagnosis not present

## 2016-11-13 DIAGNOSIS — I10 Essential (primary) hypertension: Secondary | ICD-10-CM | POA: Diagnosis not present

## 2016-11-13 DIAGNOSIS — Z23 Encounter for immunization: Secondary | ICD-10-CM

## 2016-11-13 DIAGNOSIS — E0590 Thyrotoxicosis, unspecified without thyrotoxic crisis or storm: Secondary | ICD-10-CM | POA: Diagnosis not present

## 2016-11-13 DIAGNOSIS — E119 Type 2 diabetes mellitus without complications: Secondary | ICD-10-CM

## 2016-11-13 DIAGNOSIS — C61 Malignant neoplasm of prostate: Secondary | ICD-10-CM

## 2016-11-13 DIAGNOSIS — Z0000 Encounter for general adult medical examination without abnormal findings: Secondary | ICD-10-CM | POA: Diagnosis not present

## 2016-11-13 LAB — POCT URINALYSIS DIP (MANUAL ENTRY)
Bilirubin, UA: NEGATIVE
Blood, UA: NEGATIVE
Glucose, UA: NEGATIVE
Ketones, POC UA: NEGATIVE
Nitrite, UA: NEGATIVE
Protein Ur, POC: NEGATIVE
Spec Grav, UA: 1.015
Urobilinogen, UA: 0.2
pH, UA: 6

## 2016-11-13 MED ORDER — METFORMIN HCL 500 MG PO TABS: 500.0000 mg | ORAL_TABLET | Freq: Every day | ORAL | 1 refills | Status: DC

## 2016-11-13 MED ORDER — LISINOPRIL-HYDROCHLOROTHIAZIDE 20-12.5 MG PO TABS: 1.0000 | ORAL_TABLET | Freq: Every day | ORAL | 1 refills | Status: DC

## 2016-11-13 MED ORDER — AMLODIPINE BESYLATE 10 MG PO TABS: 10.0000 mg | ORAL_TABLET | Freq: Every day | ORAL | 1 refills | Status: DC

## 2016-11-13 MED ORDER — ATORVASTATIN CALCIUM 20 MG PO TABS: 20.0000 mg | ORAL_TABLET | Freq: Every day | ORAL | 3 refills | Status: DC

## 2016-11-13 NOTE — Patient Instructions (Addendum)
Start aspirin 81mg  daily.    IF you received an x-ray today, you will receive an invoice from [facility]. Please contact [facility] at [phone] with questions or concerns regarding your invoice.   IF you received labwork today, you will receive an invoice from [facility]. Please contact [facility] at [phone] with questions or concerns regarding your invoice.   Our billing staff will not be able to assist you with questions regarding bills from these companies.  You will be contacted with the lab results as soon as they are available. The fastest way to get your results is to activate your My Chart account. Instructions are located on the last page of this paperwork. If you have not heard from [place] regarding the results in 2 weeks, please contact this office.

## 2016-11-13 NOTE — Progress Notes (Signed)
Subjective:    Patient ID: Theodore Frank, male    DOB: [birth year], 81 y.o.   MRN: [id number]  11/13/2016  Medication Refill (all meds)   HPI This 81 y.o. male presents for six month follow-up of DMII, HTN, hypercholesterolemia.  Stopped Glimeperide at last visit. Sugars running 104-103.  Had horrible dizziness due to hypoglycemia at last visit.  BP at home 130-138/79. Dx with Metformin 2011.   Last eye exam due now; goes annually; glasses.  Colon cancer: s/p resection; Dr. Collene Mares; followed closely   Prostate cancer: s/p prostatectomy; no longer followed.    Immunization History  Administered Date(s) Administered  . Influenza,inj,Quad PF,36+ Mos 06/27/2015, 06/13/2016  . Pneumococcal Conjugate-13 11/13/2016   BP Readings from Last 3 Encounters:  11/13/16 132/90  05/28/16 124/82  05/02/16 138/86   Wt Readings from Last 3 Encounters:  11/13/16 187 lb (84.8 kg)  05/28/16 181 lb (82.1 kg)  05/02/16 179 lb (81.2 kg)     Review of Systems  Constitutional: Negative for activity change, appetite change, chills, diaphoresis, fatigue, fever and unexpected weight change.  HENT: Negative for congestion, dental problem, drooling, ear discharge, ear pain, facial swelling, hearing loss, mouth sores, nosebleeds, postnasal drip, rhinorrhea, sinus pressure, sneezing, sore throat, tinnitus, trouble swallowing and voice change.   Eyes: Negative for photophobia, pain, discharge, redness, itching and visual disturbance.  Respiratory: Negative for apnea, cough, choking, chest tightness, shortness of breath, wheezing and stridor.   Cardiovascular: Negative for chest pain, palpitations and leg swelling.  Gastrointestinal: Negative for abdominal pain, blood in stool, constipation, diarrhea, nausea and vomiting.  Endocrine: Negative for cold intolerance, heat intolerance, polydipsia, polyphagia and polyuria.  Genitourinary: Negative for decreased urine volume, difficulty urinating, discharge, dysuria,  enuresis, flank pain, frequency, genital sores, hematuria, penile pain, penile swelling, scrotal swelling, testicular pain and urgency.  Musculoskeletal: Negative for arthralgias, back pain, gait problem, joint swelling, myalgias, neck pain and neck stiffness.  Skin: Negative for color change, pallor, rash and wound.  Allergic/Immunologic: Negative for environmental allergies, food allergies and immunocompromised state.  Neurological: Negative for dizziness, tremors, seizures, syncope, facial asymmetry, speech difficulty, weakness, light-headedness, numbness and headaches.  Hematological: Negative for adenopathy. Does not bruise/bleed easily.  Psychiatric/Behavioral: Negative for agitation, behavioral problems, confusion, decreased concentration, dysphoric mood, hallucinations, self-injury, sleep disturbance and suicidal ideas. The patient is not nervous/anxious and is not hyperactive.     Past Medical History:  Diagnosis Date  . Anemia   . Arthritis   . Cancer ([place]) 10/14/1999   colon cancer (surgery; no chemo; no radiation)  . Diabetes mellitus   . Hyperlipidemia   . Hypertension   . Prostate cancer ([place]) 10/13/1192   s/p prostatectomy  . Shortness of breath dyspnea    with exertion   Past Surgical History:  Procedure Laterality Date  . COLON SURGERY  2001   colon resection for colon cancer  . HERNIA REPAIR Right   . INGUINAL HERNIA REPAIR  03/05/2012   Procedure: HERNIA REPAIR INGUINAL ADULT;  Surgeon: [name], MD;  Location: [facility];  Service: General;  Laterality: Right;  Repair right inguinal hernia with mesh  . LUMBAR LAMINECTOMY/DECOMPRESSION MICRODISCECTOMY Left 11/06/2014   Procedure: LEFT LUMBAR THREE-FOUR LAMINECTOMY/DECOMPRESSION MICRODISCECTOMY 1 LEVEL;  Surgeon: [name], MD;  Location: [facility];  Service: Neurosurgery;  Laterality: Left;  . PROSTATE SURGERY     prostatectomy for prostate cancer   No Known Allergies Current Outpatient  Prescriptions  Medication Sig Dispense Refill  .  amLODipine (NORVASC) 10 MG tablet Take 1 tablet (10 mg total) by mouth daily. 90 tablet 1  . atorvastatin (LIPITOR) 20 MG tablet Take 1 tablet (20 mg total) by mouth daily. 90 tablet 3  . lisinopril-hydrochlorothiazide (PRINZIDE,ZESTORETIC) 20-12.5 MG tablet Take 1 tablet by mouth daily. 90 tablet 1  . metFORMIN (GLUCOPHAGE) 500 MG tablet Take 1 tablet (500 mg total) by mouth daily with breakfast. 90 tablet 1  . vitamin B-12 (CYANOCOBALAMIN) 1000 MCG tablet Take 1,000 mcg by mouth daily.     No current facility-administered medications for this visit.    Social History   Social History  . Marital status: Divorced    Spouse name: N/A  . Number of children: 4  . Years of education: 12   Occupational History  . [employer]    Social History Main Topics  . Smoking status: Former Smoker    Quit date: 10/13/1965  . Smokeless tobacco: Never Used  . Alcohol use No  . Drug use: No  . Sexual activity: Yes    Birth control/ protection: None   Other Topics Concern  . Not on file   Social History Narrative   Marital status: divorced; single in 2018; not dating      Children: 4 total children; 1 son died of AIDS; 3 living children       Live:Lives alone      Employment: truck Geophysicist/field seismologist; Estate manager/land agent; Building surveyor; local      Tobacco: previous smoker; quit in 1967      Alcohol: none      Exercise: none; scared to exercise      ADLs: independent with ADLs; drives      Advanced Directives:  None; desires FULL CODE         Right-handed   Only occasional coffee or hot tea   Family History  Problem Relation Age of Onset  . Hypertension Mother   . Thyroid disease Sister   . Diabetes         Objective:    BP 132/90 (BP Location: Left Arm, Patient Position: Sitting, Cuff Size: Normal)   Pulse 78   Temp 98 F (36.7 C) (Oral)   Resp 16   Ht 6\' 1"  (1.854 m)   Wt 187 lb (84.8 kg)   SpO2 97%   BMI 24.67 kg/m  Physical Exam    Constitutional: He is oriented to person, place, and time. He appears well-developed and well-nourished. No distress.  HENT:  Head: Normocephalic and atraumatic.  Right Ear: External ear normal.  Left Ear: External ear normal.  Nose: Nose normal.  Mouth/Throat: Oropharynx is clear and moist.  Eyes: Conjunctivae and EOM are normal. Pupils are equal, round, and reactive to light.  Neck: Normal range of motion. Neck supple. Carotid bruit is not present. No thyromegaly present.  Cardiovascular: Normal rate, regular rhythm, normal heart sounds and intact distal pulses.  Exam reveals no gallop and no friction rub.   No murmur heard. Pulmonary/Chest: Effort normal and breath sounds normal. He has no wheezes. He has no rales.  Abdominal: Soft. Bowel sounds are normal. He exhibits no distension and no mass. There is no tenderness. There is no rebound and no guarding.  Lymphadenopathy:    He has no cervical adenopathy.  Neurological: He is alert and oriented to person, place, and time. No cranial nerve deficit.  Skin: Skin is warm and dry. No rash noted. He is not diaphoretic.  Psychiatric: He has a normal mood  and affect. His behavior is normal.  Nursing note and vitals reviewed.  Depression screen [facility] 2/9 05/28/2016 04/25/2016 02/29/2016 01/01/2015  Decreased Interest 0 0 0 0  Down, Depressed, Hopeless 0 0 0 0  PHQ - 2 Score 0 0 0 0   Fall Risk  05/28/2016 04/25/2016 02/29/2016 01/01/2015  Falls in the past year? No No No No    Functional Status Survey: Is the patient deaf or have difficulty hearing?: No Does the patient have difficulty seeing, even when wearing glasses/contacts?: No Does the patient have difficulty concentrating, remembering, or making decisions?: No Does the patient have difficulty walking or climbing stairs?: No Does the patient have difficulty doing errands alone such as visiting a doctor's office or shopping?: No     Assessment & Plan:   1. Encounter for Medicare annual  wellness exam   2. Essential hypertension   3. Controlled type 2 diabetes mellitus without complication, without long-term current use of insulin ([facility])   4. Hyperthyroidism   5. Malignant neoplasm of prostate ([place])   6. Chronic kidney disease, stage 3   7. Pure hypercholesterolemia   8. Need for prophylactic vaccination against Streptococcus pneumoniae (pneumococcus)   9. History of colon cancer    -anticipatory guidance provided --- exercise, weight maintenance, ASA 81mg  daily. -obtain labs for chronic disease management; refills provided; increase Lipitor to 20mg  daily. -no evidence of depression; low fall risk; independent with ADLs; no hearing loss. -discussed advanced directives in detail.  -s/p Prevnar 13 -followed closely by Dr. Collene Mares due to history of colon cancer -no longer followed by urology for prostate cancer; obtain PSA.   Orders Placed This Encounter  Procedures  . Pneumococcal conjugate vaccine 13-valent IM  . CBC with Differential/Platelet  . Comprehensive metabolic panel    Order Specific Question:   Has the patient fasted?    Answer:   Yes  . TSH  . Lipid panel    Order Specific Question:   Has the patient fasted?    Answer:   Yes  . Hemoglobin A1c  . Microalbumin, urine  . T4, free  . PSA  . POCT urinalysis dipstick   Meds ordered this encounter  Medications  . amLODipine (NORVASC) 10 MG tablet    Sig: Take 1 tablet (10 mg total) by mouth daily.    Dispense:  90 tablet    Refill:  1  . atorvastatin (LIPITOR) 20 MG tablet    Sig: Take 1 tablet (20 mg total) by mouth daily.    Dispense:  90 tablet    Refill:  3  . lisinopril-hydrochlorothiazide (PRINZIDE,ZESTORETIC) 20-12.5 MG tablet    Sig: Take 1 tablet by mouth daily.    Dispense:  90 tablet    Refill:  1  . metFORMIN (GLUCOPHAGE) 500 MG tablet    Sig: Take 1 tablet (500 mg total) by mouth daily with breakfast.    Dispense:  90 tablet    Refill:  1    Return in about 6 months (around  05/13/2017) for recheck blood pressure, diabetes, high cholesterol.   Kristi Elayne Guerin, M.D. Primary Care at [facility] [address] [place], [place]  [address] [phone] phone [phone] fax

## 2016-11-14 LAB — CBC WITH DIFFERENTIAL/PLATELET
Basophils Absolute: 0 10*3/uL (ref 0.0–0.2)
Basos: 0 %
EOS (ABSOLUTE): 0.2 10*3/uL (ref 0.0–0.4)
Eos: 4 %
Hematocrit: 43.3 % (ref 37.5–51.0)
Hemoglobin: 13.9 g/dL (ref 13.0–17.7)
Immature Grans (Abs): 0 10*3/uL (ref 0.0–0.1)
Immature Granulocytes: 0 %
Lymphocytes Absolute: 1.6 10*3/uL (ref 0.7–3.1)
Lymphs: 28 %
MCH: 29.6 pg (ref 26.6–33.0)
MCHC: 32.1 g/dL (ref 31.5–35.7)
MCV: 92 fL (ref 79–97)
Monocytes Absolute: 0.3 10*3/uL (ref 0.1–0.9)
Monocytes: 4 %
Neutrophils Absolute: 3.6 10*3/uL (ref 1.4–7.0)
Neutrophils: 64 %
Platelets: 183 10*3/uL (ref 150–379)
RBC: 4.7 x10E6/uL (ref 4.14–5.80)
RDW: 13.8 % (ref 12.3–15.4)
WBC: 5.7 10*3/uL (ref 3.4–10.8)

## 2016-11-14 LAB — COMPREHENSIVE METABOLIC PANEL
ALT: 13 IU/L (ref 0–44)
AST: 16 IU/L (ref 0–40)
Albumin/Globulin Ratio: 1.2 (ref 1.2–2.2)
Albumin: 4.1 g/dL (ref 3.5–4.8)
Alkaline Phosphatase: 56 IU/L (ref 39–117)
BUN/Creatinine Ratio: 16 (ref 10–24)
BUN: 20 mg/dL (ref 8–27)
Bilirubin Total: 0.5 mg/dL (ref 0.0–1.2)
CO2: 26 mmol/L (ref 18–29)
Calcium: 10.4 mg/dL — ABNORMAL HIGH (ref 8.6–10.2)
Chloride: 103 mmol/L (ref 96–106)
Creatinine, Ser: 1.29 mg/dL — ABNORMAL HIGH (ref 0.76–1.27)
GFR calc Af Amer: 61 mL/min/{1.73_m2} (ref >59)
GFR calc non Af Amer: 53 mL/min/{1.73_m2} — ABNORMAL LOW (ref >59)
Globulin, Total: 3.4 g/dL (ref 1.5–4.5)
Glucose: 131 mg/dL — ABNORMAL HIGH (ref 65–99)
Potassium: 3.9 mmol/L (ref 3.5–5.2)
Sodium: 142 mmol/L (ref 134–144)
Total Protein: 7.5 g/dL (ref 6.0–8.5)

## 2016-11-14 LAB — LIPID PANEL
Chol/HDL Ratio: 4.1 ratio units (ref 0.0–5.0)
Cholesterol, Total: 206 mg/dL — ABNORMAL HIGH (ref 100–199)
HDL: 50 mg/dL (ref >39)
LDL Calculated: 137 mg/dL — ABNORMAL HIGH (ref 0–99)
Triglycerides: 95 mg/dL (ref 0–149)
VLDL Cholesterol Cal: 19 mg/dL (ref 5–40)

## 2016-11-14 LAB — MICROALBUMIN, URINE: Microalbumin, Urine: 9.6 ug/mL

## 2016-11-14 LAB — PSA: Prostate Specific Ag, Serum: 2.6 ng/mL (ref 0.0–4.0)

## 2016-11-14 LAB — T4, FREE: Free T4: 1.34 ng/dL (ref 0.82–1.77)

## 2016-11-14 LAB — HEMOGLOBIN A1C
Est. average glucose Bld gHb Est-mCnc: 111 mg/dL
Hgb A1c MFr Bld: 5.5 % (ref 4.8–5.6)

## 2016-11-14 LAB — TSH: TSH: 2.51 u[IU]/mL (ref 0.450–4.500)

## 2017-03-27 ENCOUNTER — Other Ambulatory Visit: Payer: Self-pay | Admitting: Physician Assistant

## 2017-03-27 DIAGNOSIS — E119 Type 2 diabetes mellitus without complications: Secondary | ICD-10-CM

## 2017-03-27 DIAGNOSIS — I10 Essential (primary) hypertension: Secondary | ICD-10-CM

## 2017-05-13 ENCOUNTER — Ambulatory Visit: Payer: Medicare Other | Admitting: Family Medicine

## 2017-05-19 ENCOUNTER — Ambulatory Visit: Payer: Medicare Other | Admitting: Family Medicine

## 2017-08-05 ENCOUNTER — Encounter: Payer: Self-pay | Admitting: Physician Assistant

## 2017-08-05 ENCOUNTER — Ambulatory Visit (INDEPENDENT_AMBULATORY_CARE_PROVIDER_SITE_OTHER): Payer: Medicare Other | Admitting: Physician Assistant

## 2017-08-05 VITALS — BP 136/84 | HR 76 | Temp 97.5°F | Resp 18 | Ht 73.0 in | Wt 174.8 lb

## 2017-08-05 DIAGNOSIS — E118 Type 2 diabetes mellitus with unspecified complications: Secondary | ICD-10-CM

## 2017-08-05 DIAGNOSIS — E7800 Pure hypercholesterolemia, unspecified: Secondary | ICD-10-CM

## 2017-08-05 DIAGNOSIS — D649 Anemia, unspecified: Secondary | ICD-10-CM

## 2017-08-05 DIAGNOSIS — H2513 Age-related nuclear cataract, bilateral: Secondary | ICD-10-CM | POA: Diagnosis not present

## 2017-08-05 DIAGNOSIS — N183 Chronic kidney disease, stage 3 unspecified: Secondary | ICD-10-CM

## 2017-08-05 DIAGNOSIS — E0590 Thyrotoxicosis, unspecified without thyrotoxic crisis or storm: Secondary | ICD-10-CM | POA: Diagnosis not present

## 2017-08-05 DIAGNOSIS — I10 Essential (primary) hypertension: Secondary | ICD-10-CM | POA: Diagnosis not present

## 2017-08-05 DIAGNOSIS — E119 Type 2 diabetes mellitus without complications: Secondary | ICD-10-CM | POA: Diagnosis not present

## 2017-08-05 MED ORDER — METFORMIN HCL 500 MG PO TABS: 500.0000 mg | ORAL_TABLET | Freq: Every day | ORAL | 3 refills | Status: DC

## 2017-08-05 MED ORDER — AMLODIPINE BESYLATE 10 MG PO TABS: 10.0000 mg | ORAL_TABLET | Freq: Every day | ORAL | 3 refills | Status: DC

## 2017-08-05 MED ORDER — ATORVASTATIN CALCIUM 20 MG PO TABS: 20.0000 mg | ORAL_TABLET | Freq: Every day | ORAL | 3 refills | Status: DC

## 2017-08-05 MED ORDER — LISINOPRIL-HYDROCHLOROTHIAZIDE 20-12.5 MG PO TABS: 1.0000 | ORAL_TABLET | Freq: Every day | ORAL | 0 refills | Status: DC

## 2017-08-05 MED ORDER — METFORMIN HCL 500 MG PO TABS: 500.0000 mg | ORAL_TABLET | Freq: Every day | ORAL | 0 refills | Status: DC

## 2017-08-05 MED ORDER — ATORVASTATIN CALCIUM 20 MG PO TABS: 20.0000 mg | ORAL_TABLET | Freq: Every day | ORAL | 0 refills | Status: DC

## 2017-08-05 MED ORDER — LISINOPRIL-HYDROCHLOROTHIAZIDE 20-12.5 MG PO TABS: 1.0000 | ORAL_TABLET | Freq: Every day | ORAL | 3 refills | Status: DC

## 2017-08-05 NOTE — Patient Instructions (Addendum)
Please schedule a visit with your eye specialist!    IF you received an x-ray today, you will receive an invoice from [facility]. Please contact [facility] at [phone] with questions or concerns regarding your invoice.   IF you received labwork today, you will receive an invoice from [facility]. Please contact [facility] at [phone] with questions or concerns regarding your invoice.   Our billing staff will not be able to assist you with questions regarding bills from these companies.  You will be contacted with the lab results as soon as they are available. The fastest way to get your results is to activate your My Chart account. Instructions are located on the last page of this paperwork. If you have not heard from [place] regarding the results in 2 weeks, please contact this office.

## 2017-08-05 NOTE — Progress Notes (Signed)
Patient ID: Theodore Frank, male    DOB: 04-03-1936, 81 y.o.   MRN: [id number]  PCP: Wardell Honour, MD  Chief Complaint  Patient presents with  . Hypertension  . Diabetes  . Hyperlipidemia  . Follow-up  . Medication Refill    All Meds    Subjective:   Presents for evaluation of DM, HTN, lipids and needs medication refills.  His last visit was in February, and DM has been well controlled, A1C was 5.5%. He does not check his home glucose. Last eye exam >2 years ago.   Blood pressure has been well controlled x 18 months. Home readings <140/90.  LDL has risen over the past 3 years from 84 to 137 last check. He is on atorvastatin 20 mg daily.  Chart review reveals that he also has hyperthyroidism.  He is tolerating his medications well, without adverse effects, but recently ran out. He is reporting increase in urinary frequency, getting up to urinate 5-6 times/night.  Review of Systems As above. No chest pain, SOB, HA, dizziness, vision change, N/V, diarrhea, constipation, dysuria, urinary urgency, myalgias, arthralgias or rash.   Patient Active Problem List   Diagnosis Date Noted  . History of colon cancer 11/13/2016  . Anemia 05/06/2016  . Arthritis 05/06/2016  . Hypoglycemia 04/23/2016  . Chronic kidney disease, stage 3 ([place]) 04/23/2016  . HNP (herniated nucleus pulposus), lumbar 11/06/2014  . Malignant neoplasm of prostate ([place]) 04/05/2013  . S/P right inguinal hernia repair 04/02/2012  . Hypertension 01/30/2012  . Hyperthyroidism 01/30/2012  . Diabetes mellitus type 2, controlled, without complications ([place]) 94/85/4627  . Hyperlipidemia 01/30/2012     Prior to Admission medications   Medication Sig Start Date End Date Taking? Authorizing Provider  amLODipine (NORVASC) 10 MG tablet TAKE 1 TABLET BY MOUTH  DAILY 03/31/17  Yes Wardell Honour, MD  atorvastatin (LIPITOR) 20 MG tablet Take 1 tablet (20 mg total) by mouth daily. 11/13/16  Yes Wardell Honour, MD    lisinopril-hydrochlorothiazide (PRINZIDE,ZESTORETIC) 20-12.5 MG tablet TAKE 1 TABLET BY MOUTH  DAILY 03/31/17  Yes Wardell Honour, MD  metFORMIN (GLUCOPHAGE) 500 MG tablet TAKE 1 TABLET BY MOUTH  DAILY WITH BREAKFAST 03/31/17  Yes Wardell Honour, MD  vitamin B-12 (CYANOCOBALAMIN) 1000 MCG tablet Take 1,000 mcg by mouth daily.   Yes [provider]     No Known Allergies     Objective:  Physical Exam  Constitutional: He is oriented to person, place, and time. He appears well-developed and well-nourished. He is active and cooperative. No distress.  BP 136/84 (BP Location: Left Arm, Patient Position: Sitting, Cuff Size: Normal)   Pulse 76   Temp (!) 97.5 F (36.4 C) (Oral)   Resp 18   Ht 6\' 1"  (1.854 m)   Wt 174 lb 12.8 oz (79.3 kg)   SpO2 97%   BMI 23.06 kg/m   HENT:  Head: Normocephalic and atraumatic.  Right Ear: Hearing normal.  Left Ear: Hearing normal.  Eyes: Conjunctivae are normal. No scleral icterus.  Neck: Normal range of motion. Neck supple. No thyromegaly present.  Cardiovascular: Normal rate, regular rhythm and normal heart sounds.   Pulses:      Radial pulses are 2+ on the right side, and 2+ on the left side.  Pulmonary/Chest: Effort normal and breath sounds normal.  Lymphadenopathy:       Head (right side): No tonsillar, no preauricular, no posterior auricular and no occipital adenopathy present.  Head (left side): No tonsillar, no preauricular, no posterior auricular and no occipital adenopathy present.    He has no cervical adenopathy.       Right: No supraclavicular adenopathy present.       Left: No supraclavicular adenopathy present.  Neurological: He is alert and oriented to person, place, and time. No sensory deficit.  Skin: Skin is warm, dry and intact. No rash noted. No cyanosis or erythema. Nails show no clubbing.  Psychiatric: He has a normal mood and affect. His speech is normal and behavior is normal.           Assessment &  Plan:   Problem List Items Addressed This Visit    Hypertension (Chronic)    Controlled. Continue current treatment.      Relevant Medications   lisinopril-hydrochlorothiazide (PRINZIDE,ZESTORETIC) 20-12.5 MG tablet   atorvastatin (LIPITOR) 20 MG tablet   amLODipine (NORVASC) 10 MG tablet   lisinopril-hydrochlorothiazide (PRINZIDE,ZESTORETIC) 20-12.5 MG tablet   atorvastatin (LIPITOR) 20 MG tablet   Other Relevant Orders   Comprehensive metabolic panel (Completed)   Type 2 diabetes mellitus with complication, without long-term current use of insulin (HCC) - Primary (Chronic)    Recommend healthy eating choices. Continue current treatment. Await lab results.      Relevant Medications   metFORMIN (GLUCOPHAGE) 500 MG tablet   lisinopril-hydrochlorothiazide (PRINZIDE,ZESTORETIC) 20-12.5 MG tablet   atorvastatin (LIPITOR) 20 MG tablet   metFORMIN (GLUCOPHAGE) 500 MG tablet   lisinopril-hydrochlorothiazide (PRINZIDE,ZESTORETIC) 20-12.5 MG tablet   atorvastatin (LIPITOR) 20 MG tablet   Hyperthyroidism    Update thyroid labs.       Relevant Orders   TSH (Completed)   T4, free (Completed)   Hyperlipidemia    Await labs. Adjust regimen as indicated by results. Goal LDL <70      Relevant Medications   lisinopril-hydrochlorothiazide (PRINZIDE,ZESTORETIC) 20-12.5 MG tablet   atorvastatin (LIPITOR) 20 MG tablet   amLODipine (NORVASC) 10 MG tablet   lisinopril-hydrochlorothiazide (PRINZIDE,ZESTORETIC) 20-12.5 MG tablet   atorvastatin (LIPITOR) 20 MG tablet   Other Relevant Orders   Comprehensive metabolic panel (Completed)   Lipid panel (Completed)   Chronic kidney disease, stage 3 (HCC)    COntinue efforts to control HTN and diabetes.      Relevant Orders   Comprehensive metabolic panel (Completed)   Anemia    Continue to monitor.      Relevant Orders   CBC with Differential/Platelet (Completed)       Return in about 3 months (around 11/05/2017) for re-evaluation with  Dr. Tamala Julian or [name] Mons, PA-C.   Fara Chute, PA-C Primary Care at [facility]

## 2017-08-05 NOTE — Progress Notes (Signed)
Subjective:    Patient ID: Theodore Frank, male    DOB: Feb 19, 1936, 81 y.o.   MRN: [id number]  HPI Theodore Frank is a 81 year old African American male with a past medical history significant for hypertension, hyperthyroidism, type 2 diabetes mellitus, hyperlipidemia, and chronic kidney disease who presents today for hypertension, diabetes, and hyperlipidemia follow-up. Theodore Frank las Hemoglobin A1c was 5.5% on 11/13/16.   Theodore Frank reports everything is going well. He is working a lot. Theodore Frank reports he is usually compliant with his medications when he has them, but he ran out in August and needs refills. He is tolerating the medications well without side effects.   Theodore Frank states that he is checking is blood pressure at home. He reports his blood pressure usually ranges from 135-139/70-78. He denies headaches. Theodore Frank also denies chest pain. He states sometimes he feels short of breath while carrying large boxes upstairs. Theodore Frank denies orthopnea and PND. He endorses some peripheral edema at the end of work days.   Theodore Frank states that he is not checking his blood glucose levels at home. He denies vision changes. He is due for an optometry visit, because his appointment was 2 years ago. He plans to go to [facility] later today. Theodore Frank denies dizziness. He states he has mild decrease in sensation in his left foot status post lumbar laminectomy/decrompression microdiscectomy. Theodore Frank reports he occasionally performs feet checks after his showers, but not very regularly. Theodore Frank states he has noticed increased urinary frequency. He is getting up 5-6 times at night to urinate. He denies hematuria and back pain.   Theodore Frank states he has 0-1 well balanced meals per day.  He states he also tries to avoid junk food and will eat some snacks such as crackers throughout the day. He does enjoy sodas, but makes sure he drinks a lot of water daily. He does not get a lot of scheduled exercise.  Patient walks a lot when he is at works and works on an as needed basis anywhere from 4-5 days a week. Theodore Frank denies alcohol use and illicit drug use. He reports he smoked cigarettes for 14 years, but has not smoked in 20 years.   Medications:  Prior to Admission medications   Medication Sig Start Date End Date Taking? Authorizing Provider  amLODipine (NORVASC) 10 MG tablet TAKE 1 TABLET BY MOUTH  DAILY 03/31/17  Yes Wardell Honour, MD  atorvastatin (LIPITOR) 20 MG tablet Take 1 tablet (20 mg total) by mouth daily. 11/13/16  Yes Wardell Honour, MD  lisinopril-hydrochlorothiazide (PRINZIDE,ZESTORETIC) 20-12.5 MG tablet TAKE 1 TABLET BY MOUTH  DAILY 03/31/17  Yes Wardell Honour, MD  metFORMIN (GLUCOPHAGE) 500 MG tablet TAKE 1 TABLET BY MOUTH  DAILY WITH BREAKFAST 03/31/17  Yes Wardell Honour, MD  vitamin B-12 (CYANOCOBALAMIN) 1000 MCG tablet Take 1,000 mcg by mouth daily.   Yes [provider]   Allergies:  No Known Allergies  Chronic Medical Conditions: Patient Active Problem List   Diagnosis Date Noted  . History of colon cancer 11/13/2016  . Anemia 05/06/2016  . Arthritis 05/06/2016  . Hypoglycemia 04/23/2016  . Chronic kidney disease, stage 3 ([place]) 04/23/2016  . HNP (herniated nucleus pulposus), lumbar 11/06/2014  . Malignant neoplasm of prostate ([place]) 04/05/2013  . S/P right inguinal hernia repair 04/02/2012  . Right inguinal hernia 02/25/2012  . Hypertension 01/30/2012  . Hyperthyroidism 01/30/2012  . Diabetes mellitus type 2, controlled,  without complications ([place]) 57/26/2035  . Hyperlipidemia 01/30/2012   Review of Systems     Objective:   Physical Exam  Constitutional: He appears well-developed and well-nourished. He is cooperative.  BP 136/84 (BP Location: Left Arm, Patient Position: Sitting, Cuff Size: Normal)   Pulse 76   Temp (!) 97.5 F (36.4 C) (Oral)   Resp 18   Ht 6\' 1"  (1.854 m)   Wt 174 lb 12.8 oz (79.3 kg)   SpO2 97%   BMI 23.06 kg/m      HENT:  Head: Normocephalic and atraumatic.  Eyes: Pupils are equal, round, and reactive to light. Conjunctivae are normal.  Neck: Normal range of motion. Neck supple. No thyromegaly present.  Cardiovascular: Normal rate, regular rhythm and normal heart sounds.   No murmur heard. Pulses:      Radial pulses are 2+ on the right side, and 2+ on the left side.       Dorsalis pedis pulses are 2+ on the right side, and 2+ on the left side.       Posterior tibial pulses are 1+ on the right side, and 1+ on the left side.  Pulmonary/Chest: Effort normal and breath sounds normal. He has no wheezes.  Lymphadenopathy:    He has no cervical adenopathy.  Neurological: He is alert.  Skin: Skin is warm and dry. No rash noted. No erythema.      Assessment & Plan:  1. Controlled type 2 diabetes mellitus without complication, without long-term current use of insulin (HCC) - Hemoglobin A1c obtained today in clinic - CMP obtained today in clinic - Continue Metformin 500mg  daily - Encouraged patient to start checking blood glucose levels at home. Also instructed patient to try to eat well-balanced meals throughout the day that consist of fruits, vegetables, lean proteins, whole grains, and low sodium. Encouraged patient to try to get at least 30 minutes of exercise 5 times per week. - Educated patient on the importance of performing feet checks daily. - Return to clinic in 3 months for diabetes follow-up.  2. Essential hypertension - CBC obtained today in clinic - TSH obtained today in clinic - Comprehensive metabolic panel - Continue Lisinopril-HCTZ 20-12.5mg  daily - Continue Amlopdipine 10mg  daily - Encouraged patient to continue to check blood pressure at home. Also instructed patient to try to eat well-balanced meals throughout the day that consist of fruits, vegetables, lean proteins, whole grains, and low sodium. Encouraged patient to try to get at least 30 minutes of exercise 5 times per week. -  Return to clinic in 3 months for hypertension follow-up.  3. Pure hypercholesterolemia - Lipid panel obtained today in clinic - Continue Atorvastatin 20mg  daily - Encouraged patient to start checking blood glucose levels at home. Also instructed patient to try to eat well-balanced meals throughout the day that consist of fruits, vegetables, lean proteins, whole grains, and low sodium. Encouraged patient to try to get at least 30 minutes of exercise 5 times per week. - Return to clinic in 3 months for hypertension follow up.  Thalia Party, PA-S

## 2017-08-06 LAB — COMPREHENSIVE METABOLIC PANEL
ALT: 11 IU/L (ref 0–44)
AST: 15 IU/L (ref 0–40)
Albumin/Globulin Ratio: 1.2 (ref 1.2–2.2)
Albumin: 4.2 g/dL (ref 3.5–4.8)
Alkaline Phosphatase: 79 IU/L (ref 39–117)
BUN/Creatinine Ratio: 14 (ref 10–24)
BUN: 18 mg/dL (ref 8–27)
Bilirubin Total: 1 mg/dL (ref 0.0–1.2)
CO2: 26 mmol/L (ref 20–29)
Calcium: 11.1 mg/dL — ABNORMAL HIGH (ref 8.6–10.2)
Chloride: 98 mmol/L (ref 96–106)
Creatinine, Ser: 1.29 mg/dL — ABNORMAL HIGH (ref 0.76–1.27)
GFR calc Af Amer: 61 mL/min/{1.73_m2} (ref >59)
GFR calc non Af Amer: 52 mL/min/{1.73_m2} — ABNORMAL LOW (ref >59)
Globulin, Total: 3.5 g/dL (ref 1.5–4.5)
Glucose: 161 mg/dL — ABNORMAL HIGH (ref 65–99)
Potassium: 3.6 mmol/L (ref 3.5–5.2)
Sodium: 139 mmol/L (ref 134–144)
Total Protein: 7.7 g/dL (ref 6.0–8.5)

## 2017-08-06 LAB — CBC WITH DIFFERENTIAL/PLATELET
Basophils Absolute: 0 10*3/uL (ref 0.0–0.2)
Basos: 0 %
EOS (ABSOLUTE): 0.1 10*3/uL (ref 0.0–0.4)
Eos: 2 %
Hematocrit: 44 % (ref 37.5–51.0)
Hemoglobin: 14.8 g/dL (ref 13.0–17.7)
Immature Grans (Abs): 0 10*3/uL (ref 0.0–0.1)
Immature Granulocytes: 0 %
Lymphocytes Absolute: 1.7 10*3/uL (ref 0.7–3.1)
Lymphs: 30 %
MCH: 30 pg (ref 26.6–33.0)
MCHC: 33.6 g/dL (ref 31.5–35.7)
MCV: 89 fL (ref 79–97)
Monocytes Absolute: 0.3 10*3/uL (ref 0.1–0.9)
Monocytes: 5 %
Neutrophils Absolute: 3.6 10*3/uL (ref 1.4–7.0)
Neutrophils: 63 %
Platelets: 234 10*3/uL (ref 150–379)
RBC: 4.94 x10E6/uL (ref 4.14–5.80)
RDW: 13.5 % (ref 12.3–15.4)
WBC: 5.7 10*3/uL (ref 3.4–10.8)

## 2017-08-06 LAB — T4, FREE: Free T4: 1.53 ng/dL (ref 0.82–1.77)

## 2017-08-06 LAB — LIPID PANEL
Chol/HDL Ratio: 2.9 ratio (ref 0.0–5.0)
Cholesterol, Total: 165 mg/dL (ref 100–199)
HDL: 56 mg/dL (ref >39)
LDL Calculated: 95 mg/dL (ref 0–99)
Triglycerides: 70 mg/dL (ref 0–149)
VLDL Cholesterol Cal: 14 mg/dL (ref 5–40)

## 2017-08-06 LAB — HEMOGLOBIN A1C
Est. average glucose Bld gHb Est-mCnc: 183 mg/dL
Hgb A1c MFr Bld: 8 % — ABNORMAL HIGH (ref 4.8–5.6)

## 2017-08-06 LAB — TSH: TSH: 1.21 u[IU]/mL (ref 0.450–4.500)

## 2017-08-14 NOTE — Assessment & Plan Note (Signed)
Update thyroid labs.

## 2017-08-14 NOTE — Assessment & Plan Note (Signed)
Recommend healthy eating choices. Continue current treatment. Await lab results.

## 2017-08-14 NOTE — Assessment & Plan Note (Signed)
Controlled. Continue current treatment. 

## 2017-08-14 NOTE — Assessment & Plan Note (Signed)
Await labs. Adjust regimen as indicated by results. Goal LDL <70 

## 2017-08-14 NOTE — Assessment & Plan Note (Signed)
Continue to monitor

## 2017-08-14 NOTE — Assessment & Plan Note (Signed)
COntinue efforts to control HTN and diabetes.

## 2017-08-18 ENCOUNTER — Encounter: Payer: Self-pay | Admitting: Physician Assistant

## 2017-09-01 ENCOUNTER — Other Ambulatory Visit: Payer: Self-pay | Admitting: Physician Assistant

## 2017-09-01 DIAGNOSIS — I10 Essential (primary) hypertension: Secondary | ICD-10-CM

## 2017-09-01 DIAGNOSIS — E7800 Pure hypercholesterolemia, unspecified: Secondary | ICD-10-CM

## 2017-09-01 DIAGNOSIS — E119 Type 2 diabetes mellitus without complications: Secondary | ICD-10-CM

## 2017-11-06 ENCOUNTER — Ambulatory Visit: Payer: Medicare Other | Admitting: Physician Assistant

## 2017-11-13 ENCOUNTER — Ambulatory Visit: Payer: Medicare Other | Admitting: Physician Assistant

## 2017-11-17 ENCOUNTER — Ambulatory Visit (INDEPENDENT_AMBULATORY_CARE_PROVIDER_SITE_OTHER): Payer: Medicare Other

## 2017-11-17 ENCOUNTER — Ambulatory Visit (INDEPENDENT_AMBULATORY_CARE_PROVIDER_SITE_OTHER): Payer: Medicare Other | Admitting: Physician Assistant

## 2017-11-17 ENCOUNTER — Encounter: Payer: Self-pay | Admitting: Physician Assistant

## 2017-11-17 ENCOUNTER — Other Ambulatory Visit: Payer: Self-pay | Admitting: Physician Assistant

## 2017-11-17 ENCOUNTER — Other Ambulatory Visit: Payer: Self-pay

## 2017-11-17 VITALS — BP 110/80 | HR 83 | Temp 98.0°F | Resp 18 | Ht 73.0 in | Wt 180.0 lb

## 2017-11-17 VITALS — BP 110/80 | HR 83 | Ht 73.0 in | Wt 180.0 lb

## 2017-11-17 DIAGNOSIS — N183 Chronic kidney disease, stage 3 unspecified: Secondary | ICD-10-CM

## 2017-11-17 DIAGNOSIS — E7800 Pure hypercholesterolemia, unspecified: Secondary | ICD-10-CM

## 2017-11-17 DIAGNOSIS — R351 Nocturia: Secondary | ICD-10-CM

## 2017-11-17 DIAGNOSIS — E118 Type 2 diabetes mellitus with unspecified complications: Secondary | ICD-10-CM

## 2017-11-17 DIAGNOSIS — E0590 Thyrotoxicosis, unspecified without thyrotoxic crisis or storm: Secondary | ICD-10-CM

## 2017-11-17 DIAGNOSIS — I10 Essential (primary) hypertension: Secondary | ICD-10-CM | POA: Diagnosis not present

## 2017-11-17 DIAGNOSIS — Z8546 Personal history of malignant neoplasm of prostate: Secondary | ICD-10-CM

## 2017-11-17 DIAGNOSIS — Z23 Encounter for immunization: Secondary | ICD-10-CM | POA: Diagnosis not present

## 2017-11-17 DIAGNOSIS — Z0000 Encounter for general adult medical examination without abnormal findings: Secondary | ICD-10-CM | POA: Diagnosis not present

## 2017-11-17 LAB — POCT CBC
Granulocyte percent: 54.4 %G (ref 37–80)
HCT, POC: 41.7 % — AB (ref 43.5–53.7)
Hemoglobin: 13.6 g/dL — AB (ref 14.1–18.1)
Lymph, poc: 2.1 (ref 0.6–3.4)
MCH, POC: 29.7 pg (ref 27–31.2)
MCHC: 32.6 g/dL (ref 31.8–35.4)
MCV: 91.1 fL (ref 80–97)
MID (cbc): 0.3 (ref 0–0.9)
MPV: 7.3 fL (ref 0–99.8)
POC Granulocyte: 2.9 (ref 2–6.9)
POC LYMPH PERCENT: 39.4 %L (ref 10–50)
POC MID %: 6.2 %M (ref 0–12)
Platelet Count, POC: 235 10*3/uL (ref 142–424)
RBC: 4.58 M/uL — AB (ref 4.69–6.13)
RDW, POC: 12.4 %
WBC: 5.4 10*3/uL (ref 4.6–10.2)

## 2017-11-17 LAB — POCT GLYCOSYLATED HEMOGLOBIN (HGB A1C): Hemoglobin A1C: 7.5

## 2017-11-17 NOTE — Progress Notes (Signed)
Patient ID: Theodore Frank, male    DOB: 1936/03/17, 82 y.o.   MRN: [id number]  PCP: Wardell Honour, MD  Chief Complaint  Patient presents with  . Diabetes  . Hypertension  . Follow-up    Subjective:   Presents for evaluation of diabetes, hypertension, hyperlipidemia.  Medicare annual wellness visit performed earlier today with Dewitt Hoes, LPN.  At his visit in October, A1c had risen from 5.5% to 8.0%.  He had run out of medications in August 2018. LDL cholesterol was 95, down from 137. Not regularly checking home glucose. Relates increased nocturia, 4-6 times per night.  Urinates only 2-4 times during the day.  Drinks a lot of water, reduces his intake at night.  Tingling sensation and some swelling in the left lower leg.  Worse with driving.  Compression socks with some benefit but he does not wear them daily. Status post lumbar laminectomy/decompression microdiscectomy at L3-4.  Reports the tingling sensation has been present since the surgery.  He was fitted for an AFO due to complete foot drop on the left.  Swelling and tingling not associated with pain or difficulty walking.    Review of Systems  Constitutional: Negative for activity change, appetite change, fatigue and unexpected weight change.  HENT: Negative for congestion, dental problem, ear pain, hearing loss, mouth sores, postnasal drip, rhinorrhea, sneezing, sore throat, tinnitus and trouble swallowing.   Eyes: Negative for photophobia, pain, redness and visual disturbance.  Respiratory: Negative for cough, chest tightness and shortness of breath.   Cardiovascular: Positive for leg swelling (LEFT). Negative for chest pain and palpitations.  Gastrointestinal: Negative for abdominal pain, blood in stool, constipation, diarrhea, nausea and vomiting.  Endocrine: Positive for polyuria (nocturia). Negative for cold intolerance, heat intolerance, polydipsia and polyphagia.  Genitourinary: Negative for dysuria, frequency,  hematuria and urgency.  Musculoskeletal: Negative for arthralgias, gait problem, myalgias and neck stiffness.  Skin: Negative for rash.  Neurological: Positive for numbness (tingling sensation LEFT lower leg). Negative for dizziness, speech difficulty, weakness, light-headedness and headaches.  Hematological: Negative for adenopathy.  Psychiatric/Behavioral: Negative for confusion and sleep disturbance. The patient is not nervous/anxious.        Patient Active Problem List   Diagnosis Date Noted  . History of colon cancer 11/13/2016  . Anemia 05/06/2016  . Arthritis 05/06/2016  . Hypoglycemia 04/23/2016  . Chronic kidney disease, stage 3 ([place]) 04/23/2016  . HNP (herniated nucleus pulposus), lumbar 11/06/2014  . History of prostate cancer 04/05/2013  . S/P right inguinal hernia repair 04/02/2012  . Hypertension 01/30/2012  . Hyperthyroidism 01/30/2012  . Type 2 diabetes mellitus with complication, without long-term current use of insulin ([place]) 01/30/2012  . Hyperlipidemia 01/30/2012     Prior to Admission medications   Medication Sig Start Date End Date Taking? Authorizing Provider  amLODipine (NORVASC) 10 MG tablet Take 1 tablet (10 mg total) by mouth daily. 08/05/17  Yes Jeffery, Chelle, PA-C  atorvastatin (LIPITOR) 20 MG tablet Take 1 tablet (20 mg total) by mouth daily. 08/05/17  Yes Jeffery, Chelle, PA-C  atorvastatin (LIPITOR) 20 MG tablet TAKE 1 TABLET BY MOUTH EVERY DAY 09/01/17  Yes Shawnee Knapp, MD  lisinopril-hydrochlorothiazide (PRINZIDE,ZESTORETIC) 20-12.5 MG tablet Take 1 tablet by mouth daily. 08/05/17  Yes Jeffery, Domingo Mend, PA-C  lisinopril-hydrochlorothiazide (PRINZIDE,ZESTORETIC) 20-12.5 MG tablet TAKE 1 TABLET BY MOUTH EVERY DAY 09/01/17  Yes Shawnee Knapp, MD  metFORMIN (GLUCOPHAGE) 500 MG tablet Take 1 tablet (500 mg total) by mouth daily with breakfast. 08/05/17  Yes Jeffery, Chelle, PA-C  metFORMIN (GLUCOPHAGE) 500 MG tablet TAKE 1 TABLET BY MOUTH EVERY DAY WITH  BREAKFAST 09/01/17  Yes Shawnee Knapp, MD  vitamin B-12 (CYANOCOBALAMIN) 1000 MCG tablet Take 1,000 mcg by mouth daily.   Yes [provider]     No Known Allergies     Objective:  Physical Exam  Constitutional: He is oriented to person, place, and time. He appears well-developed and well-nourished. He is active and cooperative. No distress.  BP 110/80 (BP Location: Right Arm, Patient Position: Sitting, Cuff Size: Normal)   Pulse 83   Temp 98 F (36.7 C) (Oral)   Resp 18   Ht 6\' 1"  (1.854 m)   Wt 180 lb (81.6 kg)   SpO2 96%   BMI 23.75 kg/m   HENT:  Head: Normocephalic and atraumatic.  Right Ear: Hearing normal.  Left Ear: Hearing normal.  Eyes: Conjunctivae are normal. No scleral icterus.  Neck: Normal range of motion. Neck supple. No thyromegaly present.  Cardiovascular: Normal rate, regular rhythm and normal heart sounds.  Pulses:      Radial pulses are 2+ on the right side, and 2+ on the left side.  Pulmonary/Chest: Effort normal and breath sounds normal.  Lymphadenopathy:       Head (right side): No tonsillar, no preauricular, no posterior auricular and no occipital adenopathy present.       Head (left side): No tonsillar, no preauricular, no posterior auricular and no occipital adenopathy present.    He has no cervical adenopathy.       Right: No supraclavicular adenopathy present.       Left: No supraclavicular adenopathy present.  Neurological: He is alert and oriented to person, place, and time. No sensory deficit.  Normal sensation LEFT LE compared to the RIGHT.  Skin: Skin is warm, dry and intact. No rash noted. No cyanosis or erythema. Nails show no clubbing.  Psychiatric: He has a normal mood and affect. His speech is normal and behavior is normal.           Assessment & Plan:   Problem List Items Addressed This Visit    Hypertension (Chronic)    Well-controlled.  Continue lisinopril-hydrochlorothiazide, amlodipine.      Relevant Orders    Comprehensive metabolic panel   POCT CBC (Completed)   Type 2 diabetes mellitus with complication, without long-term current use of insulin (HCC) - Primary (Chronic)    Await hemoglobin A1c.  If greater than 7% plan increase metformin from 500 mg daily to twice daily.      Relevant Orders   Comprehensive metabolic panel   POCT glycosylated hemoglobin (Hb A1C) (Completed)   POCT CBC (Completed)   Hyperthyroidism    Update TSH today.      Relevant Orders   TSH   T4   POCT CBC (Completed)   Hyperlipidemia    Await lab results.  If LDL greater than 70 plan increase atorvastatin from 20 mg to 40 mg.      Relevant Orders   Comprehensive metabolic panel   Lipid panel   POCT CBC (Completed)   History of prostate cancer   Relevant Orders   POCT CBC (Completed)   Chronic kidney disease, stage 3 (HCC)    Continue management of hypertension and diabetes.  Update labs today.      Relevant Orders   Comprehensive metabolic panel   POCT CBC (Completed)    Other Visit Diagnoses    Nocturia  Await lab results.       Return in about 3 months (around 02/14/2018) for re-evaluation of diabetes, blood pressure, and cholesterol.   Fara Chute, PA-C [facility]

## 2017-11-17 NOTE — Progress Notes (Signed)
Subjective:   Theodore Frank is a 82 y.o. male who presents for Medicare Annual/Subsequent preventive examination.  Review of Systems:  N/A Cardiac Risk Factors include: advanced age (>62men, >63 women);diabetes mellitus;dyslipidemia;hypertension;male gender     Objective:    Vitals: BP 110/80   Pulse 83   Ht 6\' 1"  (1.854 m)   Wt 180 lb (81.6 kg)   SpO2 96%   BMI 23.75 kg/m   Body mass index is 23.75 kg/m.  Advanced Directives 11/17/2017 04/22/2016 11/06/2014 03/02/2012  Does Patient Have a Medical Advance Directive? No No No Patient does not have advance directive  Would patient like information on creating a medical advance directive? Yes (MAU/Ambulatory/Procedural Areas - Information given) No - patient declined information No - patient declined information -    Tobacco Social History   Tobacco Use  Smoking Status Former Smoker  . Last attempt to quit: 10/13/1965  . Years since quitting: 52.1  Smokeless Tobacco Never Used     Counseling given: Not Answered   Clinical Intake:  Pre-visit preparation completed: Yes        Nutritional Status: BMI of 19-24  Normal Nutritional Risks: None Diabetes: Yes CBG done?: No Did pt. bring in CBG monitor from home?: No  How often do you need to have someone help you when you read instructions, pamphlets, or other written materials from your doctor or pharmacy?: 1 - Never What is the last grade level you completed in school?: High school diploma  Interpreter Needed?: No  Information entered by :: Andrez Grime, LPN  Past Medical History:  Diagnosis Date  . Acute encephalopathy 04/23/2016  . Anemia   . Arthritis   . Cancer ([place]) 10/14/1999   colon cancer (surgery; no chemo; no radiation)  . Diabetes mellitus   . Hyperlipidemia   . Hypertension   . Prostate cancer ([place]) 10/13/1192   s/p prostatectomy  . Right inguinal hernia 02/25/2012  . Shortness of breath dyspnea    with exertion   Past Surgical History:    Procedure Laterality Date  . COLON SURGERY  2001   colon resection for colon cancer  . HERNIA REPAIR Right   . INGUINAL HERNIA REPAIR  03/05/2012   Procedure: HERNIA REPAIR INGUINAL ADULT;  Surgeon: [name], MD;  Location: [facility];  Service: General;  Laterality: Right;  Repair right inguinal hernia with mesh  . LUMBAR LAMINECTOMY/DECOMPRESSION MICRODISCECTOMY Left 11/06/2014   Procedure: LEFT LUMBAR THREE-FOUR LAMINECTOMY/DECOMPRESSION MICRODISCECTOMY 1 LEVEL;  Surgeon: [name], MD;  Location: [facility];  Service: Neurosurgery;  Laterality: Left;  . PROSTATE SURGERY     prostatectomy for prostate cancer   Family History  Problem Relation Age of Onset  . Hypertension Mother   . Thyroid disease Sister   . Diabetes Unknown    Social History   Socioeconomic History  . Marital status: Divorced    Spouse name: None  . Number of children: 4  . Years of education: 15  . Highest education level: None  Social Needs  . Financial resource strain: Not hard at all  . Food insecurity - worry: Never true  . Food insecurity - inability: Never true  . Transportation needs - medical: No  . Transportation needs - non-medical: No  Occupational History  . Occupation: Training and development officer  Tobacco Use  . Smoking status: Former Smoker    Last attempt to quit: 10/13/1965    Years since quitting: 52.1  . Smokeless tobacco: Never Used  Substance and Sexual Activity  . Alcohol use: No  . Drug use: No  . Sexual activity: Yes    Birth control/protection: None  Other Topics Concern  . None  Social History Narrative   Marital status: divorced; single in 2018; not dating      Children: 4 total children; 1 son died of AIDS; 3 living children       Live:Lives alone      Employment: truck Geophysicist/field seismologist; Estate manager/land agent; Building surveyor; local      Tobacco: previous smoker; quit in 1967      Alcohol: none      Exercise: none; scared to exercise      ADLs: independent with ADLs;  drives      Advanced Directives:  None; desires FULL CODE         Right-handed   Only occasional coffee or hot tea    Outpatient Encounter Medications as of 11/17/2017  Medication Sig  . amLODipine (NORVASC) 10 MG tablet Take 1 tablet (10 mg total) by mouth daily.  Marland Kitchen atorvastatin (LIPITOR) 20 MG tablet Take 1 tablet (20 mg total) by mouth daily.  Marland Kitchen atorvastatin (LIPITOR) 20 MG tablet TAKE 1 TABLET BY MOUTH EVERY DAY  . lisinopril-hydrochlorothiazide (PRINZIDE,ZESTORETIC) 20-12.5 MG tablet Take 1 tablet by mouth daily.  Marland Kitchen lisinopril-hydrochlorothiazide (PRINZIDE,ZESTORETIC) 20-12.5 MG tablet TAKE 1 TABLET BY MOUTH EVERY DAY  . metFORMIN (GLUCOPHAGE) 500 MG tablet Take 1 tablet (500 mg total) by mouth daily with breakfast.  . metFORMIN (GLUCOPHAGE) 500 MG tablet TAKE 1 TABLET BY MOUTH EVERY DAY WITH BREAKFAST  . vitamin B-12 (CYANOCOBALAMIN) 1000 MCG tablet Take 1,000 mcg by mouth daily.   No facility-administered encounter medications on file as of 11/17/2017.     Activities of Daily Living In your present state of health, do you have any difficulty performing the following activities: 11/17/2017  Hearing? N  Vision? N  Difficulty concentrating or making decisions? Y  Comment Patient has issues with remembering small things slightly.   Walking or climbing stairs? N  Dressing or bathing? N  Doing errands, shopping? N  Preparing Food and eating ? N  Using the Toilet? N  In the past six months, have you accidently leaked urine? N  Do you have problems with loss of bowel control? N  Managing your Medications? N  Managing your Finances? N  Housekeeping or managing your Housekeeping? N  Some recent data might be hidden    Patient Care Team: Wardell Honour, MD as PCP - General (Family Medicine) [name], MD as Consulting Physician (Gastroenterology)   Assessment:   This is a routine wellness examination for Theodore Frank.  Exercise Activities and Dietary recommendations Current  Exercise Habits: The patient does not participate in regular exercise at present, Exercise limited by: None identified  Goals    . Exercise 3x per week (30 min per time)     Patient states that he wants to start exercising more on a consistent basis.        Fall Risk Fall Risk  11/17/2017 11/17/2017 08/05/2017 05/28/2016 04/25/2016  Falls in the past year? No No No No No   Is the patient's home free of loose throw rugs in walkways, pet beds, electrical cords, etc?   yes      Grab bars in the bathroom? no      Handrails on the stairs?   yes      Adequate lighting?   yes  Timed Get Up and Go  Performed: yes, completed within 30 seconds   Depression Screen PHQ 2/9 Scores 11/17/2017 11/17/2017 08/05/2017 05/28/2016  PHQ - 2 Score 0 0 0 0    Cognitive Function     6CIT Screen 11/17/2017  What Year? 0 points  What month? 0 points  What time? 0 points  Count back from 20 0 points  Months in reverse 0 points  Repeat phrase 0 points  Total Score 0    Immunization History  Administered Date(s) Administered  . Influenza,inj,Quad PF,6+ Mos 06/27/2015, 06/13/2016, 08/06/2017  . Pneumococcal Conjugate-13 11/13/2016  . Pneumococcal Polysaccharide-23 11/17/2017    Qualifies for Shingles Vaccine? Advised patient to check with his pharmacy about receiving the Shingrix vaccine   Screening Tests Health Maintenance  Topic Date Due  . TETANUS/TDAP  08/06/2027 (Originally 04/17/1955)  . HEMOGLOBIN A1C  02/03/2018  . FOOT EXAM  08/05/2018  . OPHTHALMOLOGY EXAM  08/05/2018  . INFLUENZA VACCINE  Completed  . PNA vac Low Risk Adult  Completed   Cancer Screenings: Lung: Low Dose CT Chest recommended if Age 68-80 years, 30 pack-year currently smoking OR have quit w/in 15years. Patient does not qualify. Colorectal: stops at age 68, Patient is 81 years old.   Additional Screenings:  Hepatitis B/HIV/Syphillis: not indicated  Hepatitis C Screening: not indicated     Patient declined tetanus vaccine  due to insurance.  Plan:   I have personally reviewed and noted the following in the patient's chart:   . Medical and social history . Use of alcohol, tobacco or illicit drugs  . Current medications and supplements . Functional ability and status . Nutritional status . Physical activity . Advanced directives . List of other physicians . Hospitalizations, surgeries, and ER visits in previous 12 months . Vitals . Screenings to include cognitive, depression, and falls . Referrals and appointments  In addition, I have reviewed and discussed with patient certain preventive protocols, quality metrics, and best practice recommendations. A written personalized care plan for preventive services as well as general preventive health recommendations were provided to patient.  1. Encounter for Medicare annual wellness exam  2. Need for 23-polyvalent pneumococcal polysaccharide vaccine - Pneumococcal 23   Andrez Grime, [place]  [id number]/02/3975

## 2017-11-17 NOTE — Patient Instructions (Addendum)
[name] , Thank you for taking time to come for your Medicare Wellness Visit. I appreciate your ongoing commitment to your health goals. Please review the following plan we discussed and let me know if I can assist you in the future.   Screening recommendations/referrals: Colonoscopy: stops at age 80 Recommended yearly ophthalmology/optometry visit for glaucoma screening and checkup Recommended yearly dental visit for hygiene and checkup  Vaccinations: Influenza vaccine: up to date Pneumococcal vaccine: administered today  Tdap vaccine: declined due to insurance Shingles vaccine: Check with your pharmacy about receiving the Shingrix vaccine    Advanced directives: Advance directive discussed with you today. I have provided a copy for you to complete at home and have notarized. Once this is complete please bring a copy in to our office so we can scan it into your chart.  Conditions/risks identified: Try to start exercising more on a consistent basis.  Next appointment: today @ 10 am with [name] Mons, next AWV in 1 year   Preventive Care 65 Years and Older, Male Preventive care refers to lifestyle choices and visits with your health care provider that can promote health and wellness. What does preventive care include?  A yearly physical exam. This is also called an annual well check.  Dental exams once or twice a year.  Routine eye exams. Ask your health care provider how often you should have your eyes checked.  Personal lifestyle choices, including:  Daily care of your teeth and gums.  Regular physical activity.  Eating a healthy diet.  Avoiding tobacco and drug use.  Limiting alcohol use.  Practicing safe sex.  Taking low doses of aspirin every day.  Taking vitamin and mineral supplements as recommended by your health care provider. What happens during an annual well check? The services and screenings done by your health care provider during your annual well  check will depend on your age, overall health, lifestyle risk factors, and family history of disease. Counseling  Your health care provider may ask you questions about your:  Alcohol use.  Tobacco use.  Drug use.  Emotional well-being.  Home and relationship well-being.  Sexual activity.  Eating habits.  History of falls.  Memory and ability to understand (cognition).  Work and work Statistician. Screening  You may have the following tests or measurements:  Height, weight, and BMI.  Blood pressure.  Lipid and cholesterol levels. These may be checked every 5 years, or more frequently if you are over 83 years old.  Skin check.  Lung cancer screening. You may have this screening every year starting at age 57 if you have a 30-pack-year history of smoking and currently smoke or have quit within the past 15 years.  Fecal occult blood test (FOBT) of the stool. You may have this test every year starting at age 7.  Flexible sigmoidoscopy or colonoscopy. You may have a sigmoidoscopy every 5 years or a colonoscopy every 10 years starting at age 18.  Prostate cancer screening. Recommendations will vary depending on your family history and other risks.  Hepatitis C blood test.  Hepatitis B blood test.  Sexually transmitted disease (STD) testing.  Diabetes screening. This is done by checking your blood sugar (glucose) after you have not eaten for a while (fasting). You may have this done every 1-3 years.  Abdominal aortic aneurysm (AAA) screening. You may need this if you are a current or former smoker.  Osteoporosis. You may be screened starting at age 82 if you are at high  risk. Talk with your health care provider about your test results, treatment options, and if necessary, the need for more tests. Vaccines  Your health care provider may recommend certain vaccines, such as:  Influenza vaccine. This is recommended every year.  Tetanus, diphtheria, and acellular  pertussis (Tdap, Td) vaccine. You may need a Td booster every 10 years.  Zoster vaccine. You may need this after age 72.  Pneumococcal 13-valent conjugate (PCV13) vaccine. One dose is recommended after age 21.  Pneumococcal polysaccharide (PPSV23) vaccine. One dose is recommended after age 103. Talk to your health care provider about which screenings and vaccines you need and how often you need them. This information is not intended to replace advice given to you by your health care provider. Make sure you discuss any questions you have with your health care provider. Document Released: 10/26/2015 Document Revised: 06/18/2016 Document Reviewed: 07/31/2015 Elsevier Interactive Patient Education  2017 [place] Prevention in the Home Falls can cause injuries. They can happen to people of all ages. There are many things you can do to make your home safe and to help prevent falls. What can I do on the outside of my home?  Regularly fix the edges of walkways and driveways and fix any cracks.  Remove anything that might make you trip as you walk through a door, such as a raised step or threshold.  Trim any bushes or trees on the path to your home.  Use bright outdoor lighting.  Clear any walking paths of anything that might make someone trip, such as rocks or tools.  Regularly check to see if handrails are loose or broken. Make sure that both sides of any steps have handrails.  Any raised decks and porches should have guardrails on the edges.  Have any leaves, snow, or ice cleared regularly.  Use sand or salt on walking paths during winter.  Clean up any spills in your garage right away. This includes oil or grease spills. What can I do in the bathroom?  Use night lights.  Install grab bars by the toilet and in the tub and shower. Do not use towel bars as grab bars.  Use non-skid mats or decals in the tub or shower.  If you need to sit down in the shower, use a plastic,  non-slip stool.  Keep the floor dry. Clean up any water that spills on the floor as soon as it happens.  Remove soap buildup in the tub or shower regularly.  Attach bath mats securely with double-sided non-slip rug tape.  Do not have throw rugs and other things on the floor that can make you trip. What can I do in the bedroom?  Use night lights.  Make sure that you have a light by your bed that is easy to reach.  Do not use any sheets or blankets that are too big for your bed. They should not hang down onto the floor.  Have a firm chair that has side arms. You can use this for support while you get dressed.  Do not have throw rugs and other things on the floor that can make you trip. What can I do in the kitchen?  Clean up any spills right away.  Avoid walking on wet floors.  Keep items that you use a lot in easy-to-reach places.  If you need to reach something above you, use a strong step stool that has a grab bar.  Keep electrical cords out of the way.  Do not use floor polish or wax that makes floors slippery. If you must use wax, use non-skid floor wax.  Do not have throw rugs and other things on the floor that can make you trip. What can I do with my stairs?  Do not leave any items on the stairs.  Make sure that there are handrails on both sides of the stairs and use them. Fix handrails that are broken or loose. Make sure that handrails are as long as the stairways.  Check any carpeting to make sure that it is firmly attached to the stairs. Fix any carpet that is loose or worn.  Avoid having throw rugs at the top or bottom of the stairs. If you do have throw rugs, attach them to the floor with carpet tape.  Make sure that you have a light switch at the top of the stairs and the bottom of the stairs. If you do not have them, ask someone to add them for you. What else can I do to help prevent falls?  Wear shoes that:  Do not have high heels.  Have rubber  bottoms.  Are comfortable and fit you well.  Are closed at the toe. Do not wear sandals.  If you use a stepladder:  Make sure that it is fully opened. Do not climb a closed stepladder.  Make sure that both sides of the stepladder are locked into place.  Ask someone to hold it for you, if possible.  Clearly mark and make sure that you can see:  Any grab bars or handrails.  First and last steps.  Where the edge of each step is.  Use tools that help you move around (mobility aids) if they are needed. These include:  Canes.  Walkers.  Scooters.  Crutches.  Turn on the lights when you go into a dark area. Replace any light bulbs as soon as they burn out.  Set up your furniture so you have a clear path. Avoid moving your furniture around.  If any of your floors are uneven, fix them.  If there are any pets around you, be aware of where they are.  Review your medicines with your doctor. Some medicines can make you feel dizzy. This can increase your chance of falling. Ask your doctor what other things that you can do to help prevent falls. This information is not intended to replace advice given to you by your health care provider. Make sure you discuss any questions you have with your health care provider. Document Released: 07/26/2009 Document Revised: 03/06/2016 Document Reviewed: 11/03/2014 Elsevier Interactive Patient Education  2017 [employer].

## 2017-11-17 NOTE — Progress Notes (Signed)
Subjective:    Patient ID: Theodore Frank, male    DOB: [birth year], 82 y.o.   MRN: [id number]  Chief Complaint  Patient presents with  . Diabetes  . Hypertension  . Follow-up   Patient presents today for evaluation of DM, HTN, and cholesterol.  Last visit was 08/05/17. A1C had increased from 5.5 (11/13/16) to 8.0 (08/05/17). Glucose levels were 161. He had reported running out of his medications August 2018 before his visit in October. Previously well controlled DM. A1C range from 5.4-6.1 in 2016-2017. HTN and hyperlipidemia have been well controlled.   Has not been checking his blood glucose at home. It has been over 2 weeks since he last checked. He does not remember what his glucose levels were running. Forgot his record log at home. He has not been checking his blood pressure regularly. Last BP he recorded at home was 138/80.   08/05/17: Total cholesterol 165, Triglycerides 70, LDL 95. Compared to 11/13/16: Total cholesterol 206, triglycerides 95, and LDL 137.   Denies: headache, dizziness, nausea, vomiting, diarrhea, chest pain, or shortness of breath.  Visits his eye care specialist yearly. Has an upcoming appointment in April.   Notes increased urination at night. Urinates 4-6x/night. During the day, urinates 2-4x/day. Drinks a lot of water, but decreases water intake at night so he will not urinate as frequently. He thinks this has worsened slightly since his last visit in October 2018.  Reports tingling and swelling in his left lower leg. Swelling worsens with driving. Compression socks help with the swelling, but he does not wear them daily. In 2016, he had a left lumbar 3-4 laminectomy/decompression microdiscectomy 1 level. He was fitted for a AFO due to complete left footdrop. Has noticed tingling sensation since the surgery. Denies any pain, decreased range of motion, or difficulty walking.   Review of Systems As above.  Patient Active Problem List   Diagnosis Date Noted  .  History of colon cancer 11/13/2016  . Anemia 05/06/2016  . Arthritis 05/06/2016  . Hypoglycemia 04/23/2016  . Chronic kidney disease, stage 3 ([place]) 04/23/2016  . HNP (herniated nucleus pulposus), lumbar 11/06/2014  . History of prostate cancer 04/05/2013  . S/P right inguinal hernia repair 04/02/2012  . Hypertension 01/30/2012  . Hyperthyroidism 01/30/2012  . Type 2 diabetes mellitus with complication, without long-term current use of insulin ([place]) 01/30/2012  . Hyperlipidemia 01/30/2012   Prior to Admission medications   Medication Sig Start Date End Date Taking? Authorizing Provider  amLODipine (NORVASC) 10 MG tablet Take 1 tablet (10 mg total) by mouth daily. 08/05/17  Yes Jeffery, Chelle, PA-C  atorvastatin (LIPITOR) 20 MG tablet Take 1 tablet (20 mg total) by mouth daily. 08/05/17  Yes Jeffery, Chelle, PA-C  lisinopril-hydrochlorothiazide (PRINZIDE,ZESTORETIC) 20-12.5 MG tablet Take 1 tablet by mouth daily. 08/05/17  Yes Jeffery, Chelle, PA-C  metFORMIN (GLUCOPHAGE) 500 MG tablet Take 1 tablet (500 mg total) by mouth daily with breakfast. 08/05/17  Yes Jeffery, Chelle, PA-C  vitamin B-12 (CYANOCOBALAMIN) 1000 MCG tablet Take 1,000 mcg by mouth daily.   Yes [provider]   No Known Allergies     Objective:   Physical Exam  Constitutional: He is oriented to person, place, and time. He appears well-developed and well-nourished.  HENT:  Head: Normocephalic.  Neck: No JVD present.  Cardiovascular: Normal rate, regular rhythm, normal heart sounds and intact distal pulses. Exam reveals no gallop and no friction rub.  No murmur heard. Pulses:      Radial  pulses are 2+ on the right side, and 2+ on the left side.  Pulmonary/Chest: Effort normal and breath sounds normal. No respiratory distress. He has no wheezes. He has no rales. He exhibits no tenderness.  Musculoskeletal: He exhibits no edema.       Right ankle: He exhibits normal range of motion and no swelling. No  tenderness.       Left ankle: He exhibits normal range of motion and no swelling. No tenderness.       Right lower leg: He exhibits no swelling and no edema.       Left lower leg: He exhibits no tenderness, no swelling and no edema.  No sensory deficits left lower leg  Lymphadenopathy:       Head (right side): No submental, no submandibular, no tonsillar, no preauricular, no posterior auricular and no occipital adenopathy present.       Head (left side): No submental, no submandibular, no tonsillar, no preauricular, no posterior auricular and no occipital adenopathy present.    He has no cervical adenopathy.  Neurological: He is alert and oriented to person, place, and time. No sensory deficit.  Psychiatric: He has a normal mood and affect. His behavior is normal.   Blood pressure 110/80, pulse 83, temperature 98 F (36.7 C), temperature source Oral, resp. rate 18, height 6\' 1"  (1.854 m), weight 180 lb (81.6 kg), SpO2 96 %.     Assessment & Plan:  1. Type 2 diabetes mellitus with complication, without long-term current use of insulin ([place]) A1C elevated 08/05/17, could be related to running out of the medication for 2 months. Continue current medications. Await lab results. Will adjust treatment as indicated by results. If A1C levels are still elevated, anticipate increasing dosage of Metformin. Nocturia and tingling in left lower leg could be related to uncontrolled diabetes.  - Comprehensive metabolic panel - POCT glycosylated hemoglobin (Hb A1C) - POCT CBC  2. Essential hypertension Well-controlled. Continue current medications.  - Comprehensive metabolic panel - POCT CBC  3. Hyperthyroidism Previous levels have been normal. Update thyroid labs. Will adjust treatment as indicated by results.  - TSH - T4 - POCT CBC  4. Pure hypercholesterolemia Levels had decreased at last visit in October 2018. Continue current treatment. Goal LDL <70. Await labs. Will adjust treatments as  indicated by results.  - Comprehensive metabolic panel - Lipid panel - POCT CBC  5. History of prostate cancer Laser prostatectomy performed in 1994 in [place] according to the patient. Could be cauasing increased frequency at night, however less likely due to recent onset over the past 6 months. Await labs to rule out uncontrolled glucose causing increased urination.  - POCT CBC  6. Chronic kidney disease, stage 3 (HCC) Continue efforts to control HTN and diabetes.  - Comprehensive metabolic panel - POCT CBC  7. Nocturia Could be related to uncontrolled glucose levels. Await lab results. Will move forward with treatment pending lab results.   Return in about 3 months (around 02/14/2018) for re-evaluation of diabetes, blood pressure, and cholesterol.  Noemi Chapel, PA-S

## 2017-11-17 NOTE — Patient Instructions (Signed)
     IF you received an x-ray today, you will receive an invoice from [facility]. Please contact [facility] at [phone] with questions or concerns regarding your invoice.   IF you received labwork today, you will receive an invoice from [facility]. Please contact [facility] at [phone] with questions or concerns regarding your invoice.   Our billing staff will not be able to assist you with questions regarding bills from these companies.  You will be contacted with the lab results as soon as they are available. The fastest way to get your results is to activate your My Chart account. Instructions are located on the last page of this paperwork. If you have not heard from us regarding the results in 2 weeks, please contact this office.     

## 2017-11-18 LAB — TSH: TSH: 1.32 u[IU]/mL (ref 0.450–4.500)

## 2017-11-18 LAB — COMPREHENSIVE METABOLIC PANEL
ALT: 14 IU/L (ref 0–44)
AST: 19 IU/L (ref 0–40)
Albumin/Globulin Ratio: 1.3 (ref 1.2–2.2)
Albumin: 4.2 g/dL (ref 3.5–4.8)
Alkaline Phosphatase: 65 IU/L (ref 39–117)
BUN/Creatinine Ratio: 16 (ref 10–24)
BUN: 20 mg/dL (ref 8–27)
Bilirubin Total: 0.4 mg/dL (ref 0.0–1.2)
CO2: 22 mmol/L (ref 20–29)
Calcium: 10.5 mg/dL — ABNORMAL HIGH (ref 8.6–10.2)
Chloride: 104 mmol/L (ref 96–106)
Creatinine, Ser: 1.27 mg/dL (ref 0.76–1.27)
GFR calc Af Amer: 62 mL/min/{1.73_m2} (ref >59)
GFR calc non Af Amer: 53 mL/min/{1.73_m2} — ABNORMAL LOW (ref >59)
Globulin, Total: 3.2 g/dL (ref 1.5–4.5)
Glucose: 141 mg/dL — ABNORMAL HIGH (ref 65–99)
Potassium: 3.7 mmol/L (ref 3.5–5.2)
Sodium: 141 mmol/L (ref 134–144)
Total Protein: 7.4 g/dL (ref 6.0–8.5)

## 2017-11-18 LAB — LIPID PANEL
Chol/HDL Ratio: 2.5 ratio (ref 0.0–5.0)
Cholesterol, Total: 132 mg/dL (ref 100–199)
HDL: 52 mg/dL (ref >39)
LDL Calculated: 60 mg/dL (ref 0–99)
Triglycerides: 101 mg/dL (ref 0–149)
VLDL Cholesterol Cal: 20 mg/dL (ref 5–40)

## 2017-11-18 LAB — T4: T4, Total: 7.3 ug/dL (ref 4.5–12.0)

## 2017-11-19 DIAGNOSIS — K1370 Unspecified lesions of oral mucosa: Secondary | ICD-10-CM | POA: Diagnosis not present

## 2017-12-09 NOTE — Assessment & Plan Note (Signed)
Update TSH today. 

## 2017-12-09 NOTE — Assessment & Plan Note (Signed)
Well-controlled.  Continue lisinopril-hydrochlorothiazide, amlodipine.

## 2017-12-09 NOTE — Assessment & Plan Note (Signed)
Await hemoglobin A1c.  If greater than 7% plan increase metformin from 500 mg daily to twice daily.

## 2017-12-09 NOTE — Assessment & Plan Note (Signed)
Continue management of hypertension and diabetes.  Update labs today.

## 2017-12-09 NOTE — Assessment & Plan Note (Signed)
Await lab results.  If LDL greater than 70 plan increase atorvastatin from 20 mg to 40 mg.

## 2018-02-23 ENCOUNTER — Other Ambulatory Visit: Payer: Self-pay

## 2018-02-23 ENCOUNTER — Encounter: Payer: Self-pay | Admitting: Physician Assistant

## 2018-02-23 ENCOUNTER — Ambulatory Visit (INDEPENDENT_AMBULATORY_CARE_PROVIDER_SITE_OTHER): Payer: Medicare Other | Admitting: Physician Assistant

## 2018-02-23 ENCOUNTER — Ambulatory Visit (INDEPENDENT_AMBULATORY_CARE_PROVIDER_SITE_OTHER): Payer: Medicare Other

## 2018-02-23 VITALS — BP 116/74 | HR 86 | Temp 97.8°F | Resp 16 | Ht 73.0 in | Wt 170.6 lb

## 2018-02-23 DIAGNOSIS — J449 Chronic obstructive pulmonary disease, unspecified: Secondary | ICD-10-CM | POA: Insufficient documentation

## 2018-02-23 DIAGNOSIS — N183 Chronic kidney disease, stage 3 unspecified: Secondary | ICD-10-CM

## 2018-02-23 DIAGNOSIS — R634 Abnormal weight loss: Secondary | ICD-10-CM | POA: Diagnosis not present

## 2018-02-23 DIAGNOSIS — I77810 Thoracic aortic ectasia: Secondary | ICD-10-CM | POA: Insufficient documentation

## 2018-02-23 DIAGNOSIS — I10 Essential (primary) hypertension: Secondary | ICD-10-CM

## 2018-02-23 DIAGNOSIS — E118 Type 2 diabetes mellitus with unspecified complications: Secondary | ICD-10-CM | POA: Diagnosis not present

## 2018-02-23 DIAGNOSIS — E0590 Thyrotoxicosis, unspecified without thyrotoxic crisis or storm: Secondary | ICD-10-CM

## 2018-02-23 DIAGNOSIS — E7800 Pure hypercholesterolemia, unspecified: Secondary | ICD-10-CM | POA: Diagnosis not present

## 2018-02-23 DIAGNOSIS — D649 Anemia, unspecified: Secondary | ICD-10-CM | POA: Diagnosis not present

## 2018-02-23 NOTE — Assessment & Plan Note (Signed)
Update CBC today.  May need reevaluation with GI.  Healthy eating with plenty of dietary iron.

## 2018-02-23 NOTE — Progress Notes (Signed)
Patient ID: Theodore Frank, male    DOB: 1935-12-03, 82 y.o.   MRN: [id number]  PCP: Wardell Honour, MD  Chief Complaint  Patient presents with  . Hyperlipidemia    follow up   . Hypertension    follow up   . Diabetes    follow up     Subjective:   Presents for evaluation of diabetes, hypertension, hyperlipidemia.  Hemoglobin A1c was 7.5% in February. He was noted to be mildly anemic at that time with a hemoglobin of 13.6. He has had excellent lipid control, LDL was 60.  Decreased appetite. "I don't eat like I'm supposed to." Has lost some weight. No lymphadenopathy, headache, hematuria, melena, hematochezia. No chest pain, shortness of breath, cough, sore throat, hoarseness. He is a former smoker, quit many years ago. History of both prostate cancer and colon cancer.  One episode yesterday of woozy-ness, disoriented. He was looking for something in the garage and couldn't find it.  Tolerating medications without adverse effects. Did not take it this morning. Doesn't take home BP or glucose.  Having some dental work done. Took an antibiotic Q6 hours for a time. Some looser stools than usual, and some stool leakage with passing gas, so stopped it before the course was complete.   Review of Systems  Constitutional: Positive for appetite change and unexpected weight change. Negative for activity change and fatigue.  HENT: Negative for congestion, dental problem, ear pain, hearing loss, mouth sores, postnasal drip, rhinorrhea, sneezing, sore throat, tinnitus and trouble swallowing.   Eyes: Negative for photophobia, pain, redness and visual disturbance.  Respiratory: Negative for cough, chest tightness and shortness of breath.   Cardiovascular: Negative for chest pain, palpitations and leg swelling.  Gastrointestinal: Negative for abdominal pain, blood in stool, constipation, diarrhea, nausea and vomiting.  Endocrine: Negative for cold intolerance, heat intolerance,  polydipsia, polyphagia and polyuria.  Genitourinary: Negative for dysuria, frequency, hematuria and urgency.  Musculoskeletal: Negative for arthralgias, gait problem, myalgias and neck stiffness.  Skin: Negative for rash.  Neurological: Negative for dizziness, speech difficulty, weakness, light-headedness, numbness and headaches.  Hematological: Negative for adenopathy.  Psychiatric/Behavioral: Negative for confusion and sleep disturbance. The patient is not nervous/anxious.        Patient Active Problem List   Diagnosis Date Noted  . History of colon cancer 11/13/2016  . Anemia 05/06/2016  . Arthritis 05/06/2016  . Hypoglycemia 04/23/2016  . Chronic kidney disease, stage 3 ([place]) 04/23/2016  . HNP (herniated nucleus pulposus), lumbar 11/06/2014  . History of prostate cancer 04/05/2013  . S/P right inguinal hernia repair 04/02/2012  . Hypertension 01/30/2012  . Hyperthyroidism 01/30/2012  . Type 2 diabetes mellitus with complication, without long-term current use of insulin ([place]) 01/30/2012  . Hyperlipidemia 01/30/2012     Prior to Admission medications   Medication Sig Start Date End Date Taking? Authorizing Provider  amLODipine (NORVASC) 10 MG tablet Take 1 tablet (10 mg total) by mouth daily. 08/05/17  Yes Jeffery, Chelle, PA-C  atorvastatin (LIPITOR) 20 MG tablet Take 1 tablet (20 mg total) by mouth daily. 08/05/17  Yes Jeffery, Chelle, PA-C  lisinopril-hydrochlorothiazide (PRINZIDE,ZESTORETIC) 20-12.5 MG tablet Take 1 tablet by mouth daily. 08/05/17  Yes Jeffery, Chelle, PA-C  metFORMIN (GLUCOPHAGE) 500 MG tablet Take 1 tablet (500 mg total) by mouth daily with breakfast. 08/05/17  Yes Jeffery, Chelle, PA-C  vitamin B-12 (CYANOCOBALAMIN) 1000 MCG tablet Take 1,000 mcg by mouth daily.   Yes [provider]  No Known Allergies     Objective:  Physical Exam  Constitutional: He is oriented to person, place, and time. He appears well-developed and  well-nourished. He is active and cooperative. No distress.  BP 116/74   Pulse 86   Temp 97.8 F (36.6 C)   Resp 16   Ht 6\' 1"  (1.854 m)   Wt 170 lb 9.6 oz (77.4 kg)   SpO2 96%   BMI 22.51 kg/m   HENT:  Head: Normocephalic and atraumatic.  Right Ear: Hearing normal.  Left Ear: Hearing normal.  Eyes: Conjunctivae are normal. No scleral icterus.  Neck: Normal range of motion. Neck supple. No thyromegaly present.  Cardiovascular: Normal rate, regular rhythm and normal heart sounds.  Pulses:      Radial pulses are 2+ on the right side, and 2+ on the left side.  Pulmonary/Chest: Effort normal and breath sounds normal.  Lymphadenopathy:       Head (right side): No tonsillar, no preauricular, no posterior auricular and no occipital adenopathy present.       Head (left side): No tonsillar, no preauricular, no posterior auricular and no occipital adenopathy present.    He has no cervical adenopathy.       Right: No supraclavicular adenopathy present.       Left: No supraclavicular adenopathy present.  Neurological: He is alert and oriented to person, place, and time. No sensory deficit.  Skin: Skin is warm, dry and intact. No rash noted. No cyanosis or erythema. Nails show no clubbing.  Psychiatric: He has a normal mood and affect. His speech is normal and behavior is normal.       Wt Readings from Last 3 Encounters:  02/23/18 170 lb 9.6 oz (77.4 kg)  11/17/17 180 lb (81.6 kg)  11/17/17 180 lb (81.6 kg)       Assessment & Plan:   Problem List Items Addressed This Visit    Hypertension (Chronic)    Well-controlled.  Recommend home blood pressure checks twice weekly.  If blood pressure remains low, would discontinue HCTZ or reduce amlodipine dose.      Relevant Orders   CBC with Differential/Platelet   Comprehensive metabolic panel   Type 2 diabetes mellitus with complication, without long-term current use of insulin (HCC) - Primary (Chronic)    Await lab results.  If needs  additional control would increase metformin from 500 mg daily to 500 mg twice daily.      Relevant Orders   Comprehensive metabolic panel   Hemoglobin A1c   Hyperthyroidism    Update labs today.  He is not on thyroid supplementation nor suppression.  His weight loss may represent need for treatment.      Relevant Orders   TSH   T4, free   Hyperlipidemia    LDL less than 70 at last check.  Continue atorvastatin.      Relevant Orders   Comprehensive metabolic panel   Lipid panel   Chronic kidney disease, stage 3 (HCC)    Has been stable.  Continue good blood pressure control and diabetes management.      Anemia    Update CBC today.  May need reevaluation with GI.  Healthy eating with plenty of dietary iron.       Other Visit Diagnoses    Loss of weight       Update labs.  Chest x-ray.  May need reevaluation with GI.   Relevant Orders   IFOBT POC (occult bld, rslt in office)  DG Chest 2 View       Return in about 3 months (around 05/26/2018) for re-evaluation of diabetes, blood pressure, cholesterol.   Fara Chute, PA-C [facility]

## 2018-02-23 NOTE — Assessment & Plan Note (Signed)
LDL less than 70 at last check.  Continue atorvastatin.

## 2018-02-23 NOTE — Assessment & Plan Note (Signed)
Update labs today.  He is not on thyroid supplementation nor suppression.  His weight loss may represent need for treatment.

## 2018-02-23 NOTE — Assessment & Plan Note (Signed)
Well-controlled.  Recommend home blood pressure checks twice weekly.  If blood pressure remains low, would discontinue HCTZ or reduce amlodipine dose.

## 2018-02-23 NOTE — Patient Instructions (Addendum)
Check your blood pressure at home 2-3 times/week. If your blood pressure stays low, we can reduce your medication.  If you choose to follow me, Go ahead and call [facility] to schedule your next visit with me there. [phone]. If you choose to stay here, I recommend Tenna Delaine, PA-C or Dr. [name].    IF you received an x-ray today, you will receive an invoice from [facility]. Please contact [facility] at [phone] with questions or concerns regarding your invoice.   IF you received labwork today, you will receive an invoice from [place]. Please contact [facility] at [phone] with questions or concerns regarding your invoice.   Our billing staff will not be able to assist you with questions regarding bills from these companies.  You will be contacted with the lab results as soon as they are available. The fastest way to get your results is to activate your My Chart account. Instructions are located on the last page of this paperwork. If you have not heard from [place] regarding the results in 2 weeks, please contact this office.     High-Protein and High-Calorie Diet Eating high-protein and high-calorie foods can help you to gain weight, heal after an injury, and recover after an illness or surgery. What is my plan? The specific amount of daily protein and calories you need depends on:  Your body weight.  The reason this diet is recommended for you.  Generally, a high-protein, high-calorie diet involves:  Eating 250-500 extra calories each day.  Making sure that 10-35% of your daily calories come from protein.  Talk to your health care provider about how much protein and how many calories you need each day. Follow the diet as directed by your health care provider. What do I need to know about this diet?  Ask your health care provider if you should take a nutritional supplement.  Try to eat six small meals each day instead of  three large meals.  Eat a balanced diet, including one food that is high in protein at each meal.  Keep nutritious snacks handy, such as nuts, trail mixes, dried fruit, and yogurt.  If you have kidney disease or diabetes, eating too much protein may put extra stress on your kidneys. Talk to your health care provider if you have either of those conditions. What are some high-protein foods? Grains Quinoa. Bulgur wheat. Vegetables Soybeans. Peas. Meats and Other Protein Sources Beef, pork, and poultry. Fish and seafood. Eggs. Tofu. Textured vegetable protein (TVP). Peanut butter. Nuts and seeds. Dried beans. Protein powders. Dairy Whole milk. Whole-milk yogurt. Powdered milk. Cheese. Yahoo. Eggnog. Beverages High-protein supplement drinks. Soy milk. Other Protein bars. The items listed above may not be a complete list of recommended foods or beverages. Contact your dietitian for more options. What are some high-calorie foods? Grains Pasta. Quick breads. Muffins. Pancakes. Ready-to-eat cereal. Vegetables Vegetables cooked in oil or butter. Fried potatoes. Fruits Dried fruit. Fruit leather. Canned fruit in syrup. Fruit juice. Avocados. Meats and Other Protein Sources Peanut butter. Nuts and seeds. Dairy Heavy cream. Whipped cream. Cream cheese. Sour cream. Ice cream. Custard. Pudding. Beverages Meal-replacement beverages. Nutrition shakes. Fruit juice. Sugar-sweetened soft drinks. Condiments Salad dressing. Mayonnaise. Alfredo sauce. Fruit preserves or jelly. Honey. Syrup. Sweets/Desserts Cake. Cookies. Pie. Pastries. Candy bars. Chocolate. Fats and Oils Butter or margarine. Oil. Gravy. Other Meal-replacement bars. The items listed above may not be a complete list of recommended foods or beverages. Contact your dietitian for more options.  What are some tips for including high-protein and high-calorie foods in my diet?  Add whole milk, half-and-half, or heavy cream to  cereal, pudding, soup, or hot cocoa.  Add whole milk to instant breakfast drinks.  Add peanut butter to oatmeal or smoothies.  Add powdered milk to baked goods, smoothies, or milkshakes.  Add powdered milk, cream, or butter to mashed potatoes.  Add cheese to cooked vegetables.  Make whole-milk yogurt parfaits. Top them with granola, fruit, or nuts.  Add cottage cheese to your fruit.  Add avocados, cheese, or both to sandwiches or salads.  Add meat, poultry, or seafood to rice, pasta, casseroles, salads, and soups.  Use mayonnaise when making egg salad, chicken salad, or tuna salad.  Use peanut butter as a topping for pretzels, celery, or crackers.  Add beans to casseroles, dips, and spreads.  Add pureed beans to sauces and soups.  Replace calorie-free drinks with calorie-containing drinks, such as milk and fruit juice. This information is not intended to replace advice given to you by your health care provider. Make sure you discuss any questions you have with your health care provider. Document Released: 09/29/2005 Document Revised: 03/06/2016 Document Reviewed: 03/14/2014 Elsevier Interactive Patient Education  [employer].

## 2018-02-23 NOTE — Assessment & Plan Note (Signed)
Await lab results.  If needs additional control would increase metformin from 500 mg daily to 500 mg twice daily.

## 2018-02-23 NOTE — Assessment & Plan Note (Signed)
Has been stable.  Continue good blood pressure control and diabetes management.

## 2018-02-24 ENCOUNTER — Other Ambulatory Visit: Payer: Self-pay | Admitting: Physician Assistant

## 2018-02-24 DIAGNOSIS — I77810 Thoracic aortic ectasia: Secondary | ICD-10-CM

## 2018-02-24 LAB — CBC WITH DIFFERENTIAL/PLATELET
Basophils Absolute: 0 10*3/uL (ref 0.0–0.2)
Basos: 0 %
EOS (ABSOLUTE): 0.1 10*3/uL (ref 0.0–0.4)
Eos: 3 %
Hematocrit: 40.1 % (ref 37.5–51.0)
Hemoglobin: 13.6 g/dL (ref 13.0–17.7)
Immature Grans (Abs): 0 10*3/uL (ref 0.0–0.1)
Immature Granulocytes: 0 %
Lymphocytes Absolute: 1.5 10*3/uL (ref 0.7–3.1)
Lymphs: 33 %
MCH: 30.6 pg (ref 26.6–33.0)
MCHC: 33.9 g/dL (ref 31.5–35.7)
MCV: 90 fL (ref 79–97)
Monocytes Absolute: 0.4 10*3/uL (ref 0.1–0.9)
Monocytes: 8 %
Neutrophils Absolute: 2.6 10*3/uL (ref 1.4–7.0)
Neutrophils: 56 %
Platelets: 191 10*3/uL (ref 150–379)
RBC: 4.45 x10E6/uL (ref 4.14–5.80)
RDW: 13.5 % (ref 12.3–15.4)
WBC: 4.6 10*3/uL (ref 3.4–10.8)

## 2018-02-24 LAB — COMPREHENSIVE METABOLIC PANEL
ALT: 14 IU/L (ref 0–44)
AST: 21 IU/L (ref 0–40)
Albumin/Globulin Ratio: 1.4 (ref 1.2–2.2)
Albumin: 4 g/dL (ref 3.5–4.8)
Alkaline Phosphatase: 62 IU/L (ref 39–117)
BUN/Creatinine Ratio: 17 (ref 10–24)
BUN: 25 mg/dL (ref 8–27)
Bilirubin Total: 1.1 mg/dL (ref 0.0–1.2)
CO2: 22 mmol/L (ref 20–29)
Calcium: 10.9 mg/dL — ABNORMAL HIGH (ref 8.6–10.2)
Chloride: 103 mmol/L (ref 96–106)
Creatinine, Ser: 1.49 mg/dL — ABNORMAL HIGH (ref 0.76–1.27)
GFR calc Af Amer: 51 mL/min/{1.73_m2} — ABNORMAL LOW (ref >59)
GFR calc non Af Amer: 44 mL/min/{1.73_m2} — ABNORMAL LOW (ref >59)
Globulin, Total: 2.8 g/dL (ref 1.5–4.5)
Glucose: 165 mg/dL — ABNORMAL HIGH (ref 65–99)
Potassium: 3.6 mmol/L (ref 3.5–5.2)
Sodium: 140 mmol/L (ref 134–144)
Total Protein: 6.8 g/dL (ref 6.0–8.5)

## 2018-02-24 LAB — TSH: TSH: 1.07 u[IU]/mL (ref 0.450–4.500)

## 2018-02-24 LAB — LIPID PANEL
Chol/HDL Ratio: 2.8 ratio (ref 0.0–5.0)
Cholesterol, Total: 126 mg/dL (ref 100–199)
HDL: 45 mg/dL (ref >39)
LDL Calculated: 65 mg/dL (ref 0–99)
Triglycerides: 81 mg/dL (ref 0–149)
VLDL Cholesterol Cal: 16 mg/dL (ref 5–40)

## 2018-02-24 LAB — HEMOGLOBIN A1C
Est. average glucose Bld gHb Est-mCnc: 180 mg/dL
Hgb A1c MFr Bld: 7.9 % — ABNORMAL HIGH (ref 4.8–5.6)

## 2018-02-24 LAB — T4, FREE: Free T4: 1.36 ng/dL (ref 0.82–1.77)

## 2018-02-24 NOTE — Progress Notes (Signed)
Orders Placed This Encounter  Procedures  . CT Angio Chest W/Cm &/Or Wo Cm    Standing Status:   Future    Standing Expiration Date:   05/28/2019    Order Specific Question:   If indicated for the ordered procedure, I authorize the administration of contrast media per Radiology protocol    Answer:   Yes    Order Specific Question:   Preferred imaging location?    Answer:   [facility]    Order Specific Question:   Radiology Contrast Protocol - do NOT remove file path    Answer:   [email or web address]      Dg Chest 2 View  Result Date: 02/23/2018 CLINICAL DATA:  Unintended weight loss, former smoker, history of prostate and colonic malignancy EXAM: CHEST - 2 VIEW COMPARISON:  PA and lateral chest x-ray of April 23, 2016 FINDINGS: The lungs remain hyperinflated with hemidiaphragm flattening. There is no focal infiltrate. There is no pleural effusion. There is tortuosity and ectasia of the thoracic aorta which is slightly more conspicuous today. The heart and pulmonary vascularity are normal. The mediastinum is normal in width. There is no pleural effusion. The bony thorax exhibits no acute abnormality. IMPRESSION: Marked tortuosity and probable dilation of the thoracic aorta. CT scanning of the chest is recommended. COPD.  No mediastinal nor hilar mass is observed. Electronically Signed   By: David  Martinique M.D.   On: 02/23/2018 09:34

## 2018-03-01 LAB — IFOBT (OCCULT BLOOD): IFOBT: NEGATIVE

## 2018-03-01 NOTE — Addendum Note (Signed)
Addended by: Dierdre Searles on: 03/01/2018 06:02 PM   Modules accepted: Orders

## 2018-03-10 ENCOUNTER — Encounter: Payer: Self-pay | Admitting: Family Medicine

## 2018-08-21 ENCOUNTER — Ambulatory Visit: Payer: Medicare Other | Admitting: Family Medicine

## 2018-09-06 ENCOUNTER — Ambulatory Visit (INDEPENDENT_AMBULATORY_CARE_PROVIDER_SITE_OTHER): Payer: Medicare Other | Admitting: Family Medicine

## 2018-09-06 ENCOUNTER — Other Ambulatory Visit: Payer: Self-pay

## 2018-09-06 ENCOUNTER — Encounter: Payer: Self-pay | Admitting: Family Medicine

## 2018-09-06 VITALS — BP 142/80 | HR 55 | Temp 97.9°F | Ht 73.0 in | Wt 173.4 lb

## 2018-09-06 DIAGNOSIS — E119 Type 2 diabetes mellitus without complications: Secondary | ICD-10-CM

## 2018-09-06 DIAGNOSIS — E785 Hyperlipidemia, unspecified: Secondary | ICD-10-CM

## 2018-09-06 DIAGNOSIS — Z23 Encounter for immunization: Secondary | ICD-10-CM

## 2018-09-06 DIAGNOSIS — I10 Essential (primary) hypertension: Secondary | ICD-10-CM | POA: Diagnosis not present

## 2018-09-06 DIAGNOSIS — E7800 Pure hypercholesterolemia, unspecified: Secondary | ICD-10-CM | POA: Diagnosis not present

## 2018-09-06 DIAGNOSIS — E1122 Type 2 diabetes mellitus with diabetic chronic kidney disease: Secondary | ICD-10-CM | POA: Diagnosis not present

## 2018-09-06 MED ORDER — METFORMIN HCL 500 MG PO TABS: 500.0000 mg | ORAL_TABLET | Freq: Every day | ORAL | 1 refills | Status: DC

## 2018-09-06 MED ORDER — LISINOPRIL-HYDROCHLOROTHIAZIDE 20-12.5 MG PO TABS: 1.0000 | ORAL_TABLET | Freq: Every day | ORAL | 1 refills | Status: DC

## 2018-09-06 MED ORDER — AMLODIPINE BESYLATE 5 MG PO TABS: 5.0000 mg | ORAL_TABLET | Freq: Every day | ORAL | 1 refills | Status: DC

## 2018-09-06 MED ORDER — ATORVASTATIN CALCIUM 20 MG PO TABS: 20.0000 mg | ORAL_TABLET | Freq: Every day | ORAL | 1 refills | Status: DC

## 2018-09-06 NOTE — Progress Notes (Signed)
Subjective:  By signing my name below, I, Moises Blood, attest that this documentation has been prepared under the direction and in the presence of Merri Ray, MD. Electronically Signed: Moises Blood, [place]. 09/06/2018 , 10:44 AM .  Patient was seen in Room 10 .   Patient ID: Theodore Frank, male    DOB: 03-Jul-1936, 82 y.o.   MRN: [id number] Chief Complaint  Patient presents with  . Medication Refill    refill on all meds. out of meds for 2 weeks    HPI Theodore Frank is a 82 y.o. male Here for medication refills. Last seen in May by [name] Mons. He reports being out of his medications in the past 2 weeks with occasional missed doses prior. 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   He mentions his older brother had passed away when he was 47 years old with a bad heart, and never took good care of himself.   Health maintenance He received his flu shot today.   Diabetes with associated CKD Lab Results  Component Value Date   HGBA1C 7.9 (H) 02/23/2018   Wt Readings from Last 3 Encounters:  09/06/18 173 lb 6.4 oz (78.7 kg)  02/23/18 170 lb 9.6 oz (77.4 kg)  11/17/17 180 lb (81.6 kg)   Plan for 3 month follow up at last visit. He had previously taken metformin 500 mg qd. He had urine micro albumin in February 2018 which was normal. He is on an ACE inhibitor and statin.   Patient states he was metformin 500 mg qd 2 tablets at one time, but was having issues with it; so cut back to metformin 500 mg qd. He never tried metformin 500 mg bid, one in morning and evening.   CKD Lab Results  Component Value Date   CREATININE 1.49 (H) 02/23/2018   Thought to be elevated from previous diarrheal illness, possibly from antibiotic use; had planned follow up testing. Appears previous range of 1.27 to 1.29 over the past year.   HTN BP Readings from Last 3 Encounters:  09/06/18 (!) 142/80  02/23/18 116/74  11/17/17 110/80   Lab Results  Component Value Date   CREATININE 1.49 (H) 02/23/2018   Plan for recheck creatinine as above. He takes Lisinopril-HCTZ 20-12.5 mg qd, and norvasc 10 mg qd. His BP was 116/74 at May 14th visit; he did note some possible lightheadedness a day prior with possible reduction in medications if remained low.   Patient states he was having some lightheadedness and some nausea prior to running out of his medications. He has a BP cuff at home, but hasn't been checking his BP as he hasn't been at home. He denies chest pain, shortness of breath or lightheadedness.   Hyperlipidemia Lab Results  Component Value Date   CHOL 126 02/23/2018   HDL 45 02/23/2018   LDLCALC 65 02/23/2018   TRIG 81 02/23/2018   CHOLHDL 2.8 02/23/2018   Lab Results  Component Value Date   ALT 14 02/23/2018   AST 21 02/23/2018   ALKPHOS 62 02/23/2018   BILITOT 1.1 02/23/2018   He was previously taking Lipitor 20 mg qd. He denies side effects with his medications.   Patient Active Problem List   Diagnosis Date Noted  . COPD (chronic obstructive pulmonary disease) ([place]) 02/23/2018  . Dilation of thoracic aorta ([place]) 02/23/2018  . History of colon cancer 11/13/2016  . Anemia 05/06/2016  . Arthritis 05/06/2016  . Hypoglycemia 04/23/2016  .  Chronic kidney disease, stage 3 ([place]) 04/23/2016  . HNP (herniated nucleus pulposus), lumbar 11/06/2014  . History of prostate cancer 04/05/2013  . S/P right inguinal hernia repair 04/02/2012  . Hypertension 01/30/2012  . Hyperthyroidism 01/30/2012  . Type 2 diabetes mellitus with complication, without long-term current use of insulin ([place]) 01/30/2012  . Hyperlipidemia 01/30/2012   Past Medical History:  Diagnosis Date  . Acute encephalopathy 04/23/2016  . Anemia   . Arthritis   . Cancer ([place]) 10/14/1999   colon cancer (surgery; no chemo; no radiation)  . Diabetes mellitus   . Hyperlipidemia   . Hypertension   . Prostate cancer ([place]) 10/13/1192   s/p prostatectomy  . Right inguinal hernia 02/25/2012   . Shortness of breath dyspnea    with exertion   Past Surgical History:  Procedure Laterality Date  . COLON SURGERY  2001   colon resection for colon cancer  . HERNIA REPAIR Right   . INGUINAL HERNIA REPAIR  03/05/2012   Procedure: HERNIA REPAIR INGUINAL ADULT;  Surgeon: [name], MD;  Location: [facility];  Service: General;  Laterality: Right;  Repair right inguinal hernia with mesh  . LUMBAR LAMINECTOMY/DECOMPRESSION MICRODISCECTOMY Left 11/06/2014   Procedure: LEFT LUMBAR THREE-FOUR LAMINECTOMY/DECOMPRESSION MICRODISCECTOMY 1 LEVEL;  Surgeon: [name], MD;  Location: [facility];  Service: Neurosurgery;  Laterality: Left;  . PROSTATE SURGERY     prostatectomy for prostate cancer   No Known Allergies Prior to Admission medications   Medication Sig Start Date End Date Taking? Authorizing Provider  amLODipine (NORVASC) 10 MG tablet Take 1 tablet (10 mg total) by mouth daily. 08/05/17   [name] Mons, PA  atorvastatin (LIPITOR) 20 MG tablet Take 1 tablet (20 mg total) by mouth daily. 08/05/17   [name] Mons, PA  lisinopril-hydrochlorothiazide (PRINZIDE,ZESTORETIC) 20-12.5 MG tablet Take 1 tablet by mouth daily. 08/05/17   [name] Mons, PA  metFORMIN (GLUCOPHAGE) 500 MG tablet Take 1 tablet (500 mg total) by mouth daily with breakfast. 08/05/17   [name] Mons, PA  vitamin B-12 (CYANOCOBALAMIN) 1000 MCG tablet Take 1,000 mcg by mouth daily.    [provider]   Social History   Socioeconomic History  . Marital status: Divorced    Spouse name: Not on file  . Number of children: 4  . Years of education: 73  . Highest education level: Not on file  Occupational History  . Occupation: Training and development officer  Social Needs  . Financial resource strain: Not hard at all  . Food insecurity:    Worry: Never true    Inability: Never true  . Transportation needs:    Medical: No    Non-medical: No  Tobacco Use  . Smoking status: Former Smoker    Last  attempt to quit: 10/13/1965    Years since quitting: 52.9  . Smokeless tobacco: Never Used  Substance and Sexual Activity  . Alcohol use: No  . Drug use: No  . Sexual activity: Yes    Birth control/protection: None  Lifestyle  . Physical activity:    Days per week: 0 days    Minutes per session: 0 min  . Stress: Not at all  Relationships  . Social connections:    Talks on phone: Never    Gets together: Never    Attends religious service: More than 4 times per year    Active member of club or organization: No    Attends meetings of clubs or organizations: Never    Relationship  status: Divorced  . Intimate partner violence:    Fear of current or ex partner: No    Emotionally abused: No    Physically abused: No    Forced sexual activity: No  Other Topics Concern  . Not on file  Social History Narrative   Marital status: divorced; single in 2018; not dating      Children: 4 total children; 1 son died of AIDS; 3 living children       Live:Lives alone      Employment: truck Geophysicist/field seismologist; Estate manager/land agent; Building surveyor; local      Tobacco: previous smoker; quit in 1967      Alcohol: none      Exercise: none; scared to exercise      ADLs: independent with ADLs; drives      Advanced Directives:  None; desires FULL CODE         Right-handed   Only occasional coffee or hot tea   Review of Systems  Constitutional: Negative for fatigue and unexpected weight change.  Eyes: Negative for visual disturbance.  Respiratory: Negative for cough, chest tightness and shortness of breath.   Cardiovascular: Negative for chest pain, palpitations and leg swelling.  Gastrointestinal: Negative for abdominal pain and blood in stool.  Neurological: Negative for dizziness, light-headedness and headaches.       Objective:   Physical Exam  Constitutional: He is oriented to person, place, and time. He appears well-developed and well-nourished.  HENT:  Head: Normocephalic and atraumatic.  Eyes:  Pupils are equal, round, and reactive to light. EOM are normal.  Neck: No JVD present. Carotid bruit is not present.  Cardiovascular: Normal rate, regular rhythm and normal heart sounds.  No murmur heard. Pulmonary/Chest: Effort normal and breath sounds normal. He has no rales.  Musculoskeletal: He exhibits no edema.  Neurological: He is alert and oriented to person, place, and time.  Skin: Skin is warm and dry.  Psychiatric: He has a normal mood and affect.  Vitals reviewed.   Vitals:   09/06/18 1017 09/06/18 1023  BP: (!) 162/90 (!) 142/80  Pulse: (!) 55   Temp: 97.9 F (36.6 C)   TempSrc: Oral   SpO2: 98%   Weight: 173 lb 6.4 oz (78.7 kg)   Height: 6\' 1"  (1.854 m)        Assessment & Plan:    Theodore Frank is a 82 y.o. male Type 2 diabetes mellitus with chronic kidney disease, without long-term current use of insulin, unspecified CKD stage ([place]) - Plan: Hemoglobin A1c, Microalbumin / creatinine urine ratio  -Anticipate slight bump in A1c as off meds past 2 weeks.  Did not tolerate 1000 mg dose of metformin, but could consider twice daily dosing of 500 if elevated versus additional agent if needed.  Continue metformin 500 mg daily for now.  -Check labs including creatinine as elevated previously from baseline.  Essential hypertension - Plan: Comprehensive metabolic panel, amLODipine (NORVASC) 5 MG tablet, lisinopril-hydrochlorothiazide (PRINZIDE,ZESTORETIC) 20-12.5 MG tablet  -Meds.  Restart lisinopril HCT, but lower dose of amlodipine for review of previous note and episodic lightheadedness possibly over controlled.  Recheck in 6 weeks with home BP readings.  Return sooner if symptomatic.  Hyperlipidemia, unspecified hyperlipidemia type - Plan: Lipid panel, Pure hypercholesterolemia - Plan: atorvastatin (LIPITOR) 20 MG tablet  -Tolerating Lipitor, continue same dose.  Controlled type 2 diabetes mellitus without complication, without long-term current use of insulin ([place]) -  Plan: metFORMIN (GLUCOPHAGE) 500 MG tablet  -As above.  Meds ordered this encounter  Medications  . amLODipine (NORVASC) 5 MG tablet    Sig: Take 1 tablet (5 mg total) by mouth daily.    Dispense:  90 tablet    Refill:  1  . lisinopril-hydrochlorothiazide (PRINZIDE,ZESTORETIC) 20-12.5 MG tablet    Sig: Take 1 tablet by mouth daily.    Dispense:  90 tablet    Refill:  1  . metFORMIN (GLUCOPHAGE) 500 MG tablet    Sig: Take 1 tablet (500 mg total) by mouth daily with breakfast.    Dispense:  90 tablet    Refill:  1  . atorvastatin (LIPITOR) 20 MG tablet    Sig: Take 1 tablet (20 mg total) by mouth daily.    Dispense:  90 tablet    Refill:  1   Patient Instructions    Depending on A1c, may need to consider additional med.  For now restart metformin once per day.   I will decrease the amlodipine to 5mg  per day for now, restart prior dose of lisinopril/HCT once per day.  Keep a record of your blood pressures outside of the office and bring them to the next office visit.  Restart cholesterol med same dose for now.   Recheck in 4-6 weeks.  How to Take Your Blood Pressure You can take your blood pressure at home with a machine. You may need to check your blood pressure at home:  To check if you have high blood pressure (hypertension).  To check your blood pressure over time.  To make sure your blood pressure medicine is working.  Supplies needed: You will need a blood pressure machine, or monitor. You can buy one at a drugstore or online. When choosing one:  Choose one with an arm cuff.  Choose one that wraps around your upper arm. Only one finger should fit between your arm and the cuff.  Do not choose one that measures your blood pressure from your wrist or finger.  Your doctor can suggest a monitor. How to prepare Avoid these things for 30 minutes before checking your blood pressure:  Drinking caffeine.  Drinking  alcohol.  Eating.  Smoking.  Exercising.  Five minutes before checking your blood pressure:  Pee.  Sit in a dining chair. Avoid sitting in a soft couch or armchair.  Be quiet. Do not talk.  How to take your blood pressure Follow the instructions that came with your machine. If you have a digital blood pressure monitor, these may be the instructions: 1. Sit up straight. 2. Place your feet on the floor. Do not cross your ankles or legs. 3. Rest your left arm at the level of your heart. You may rest it on a table, desk, or chair. 4. Pull up your shirt sleeve. 5. Wrap the blood pressure cuff around the upper part of your left arm. The cuff should be 1 inch (2.5 cm) above your elbow. It is best to wrap the cuff around bare skin. 6. Fit the cuff snugly around your arm. You should be able to place only one finger between the cuff and your arm. 7. Put the cord inside the groove of your elbow. 8. Press the power button. 9. Sit quietly while the cuff fills with air and loses air. 10. Write down the numbers on the screen. 11. Wait 2-3 minutes and then repeat steps 1-10.  What do the numbers mean? Two numbers make up your blood pressure. The first number is called systolic pressure. The second is  called diastolic pressure. An example of a blood pressure reading is "120 over 80" (or 120/80). If you are an adult and do not have a medical condition, use this guide to find out if your blood pressure is normal: Normal  First number: below 120.  Second number: below 80. Elevated  First number: 120-129.  Second number: below 80. Hypertension stage 1  First number: 130-139.  Second number: 80-89. Hypertension stage 2  First number: 140 or above.  Second number: 38 or above. Your blood pressure is above normal even if only the top or bottom number is above normal. Follow these instructions at home:  Check your blood pressure as often as your doctor tells you to.  Take your  monitor to your next doctor's appointment. Your doctor will: ? Make sure you are using it correctly. ? Make sure it is working right.  Make sure you understand what your blood pressure numbers should be.  Tell your doctor if your medicines are causing side effects. Contact a doctor if:  Your blood pressure keeps being high. Get help right away if:  Your first blood pressure number is higher than 180.  Your second blood pressure number is higher than 120. This information is not intended to replace advice given to you by your health care provider. Make sure you discuss any questions you have with your health care provider. Document Released: 09/11/2008 Document Revised: 08/27/2016 Document Reviewed: 03/07/2016 Elsevier Interactive Patient Education  [employer].    If you have lab work done today you will be contacted with your lab results within the next 2 weeks.  If you have not heard from [place] then please contact us. The fastest way to get your results is to register for My Chart.   IF you received an x-ray today, you will receive an invoice from [facility]. Please contact [facility] at [phone] with questions or concerns regarding your invoice.   IF you received labwork today, you will receive an invoice from [facility]. Please contact [facility] at [phone] with questions or concerns regarding your invoice.   Our billing staff will not be able to assist you with questions regarding bills from these companies.  You will be contacted with the lab results as soon as they are available. The fastest way to get your results is to activate your My Chart account. Instructions are located on the last page of this paperwork. If you have not heard from [place] regarding the results in 2 weeks, please contact this office.       I personally performed the services described in this documentation, which was scribed in my presence. The recorded information has been  reviewed and considered for accuracy and completeness, addended by me as needed, and agree with information above.  Signed,   Merri Ray, MD [facility].  09/06/18 11:00 AM

## 2018-09-06 NOTE — Patient Instructions (Addendum)
Depending on A1c, may need to consider additional med.  For now restart metformin once per day.   I will decrease the amlodipine to 5mg  per day for now, restart prior dose of lisinopril/HCT once per day.  Keep a record of your blood pressures outside of the office and bring them to the next office visit.  Restart cholesterol med same dose for now.   Recheck in 4-6 weeks.  How to Take Your Blood Pressure You can take your blood pressure at home with a machine. You may need to check your blood pressure at home:  To check if you have high blood pressure (hypertension).  To check your blood pressure over time.  To make sure your blood pressure medicine is working.  Supplies needed: You will need a blood pressure machine, or monitor. You can buy one at a drugstore or online. When choosing one:  Choose one with an arm cuff.  Choose one that wraps around your upper arm. Only one finger should fit between your arm and the cuff.  Do not choose one that measures your blood pressure from your wrist or finger.  Your doctor can suggest a monitor. How to prepare Avoid these things for 30 minutes before checking your blood pressure:  Drinking caffeine.  Drinking alcohol.  Eating.  Smoking.  Exercising.  Five minutes before checking your blood pressure:  Pee.  Sit in a dining chair. Avoid sitting in a soft couch or armchair.  Be quiet. Do not talk.  How to take your blood pressure Follow the instructions that came with your machine. If you have a digital blood pressure monitor, these may be the instructions: 1. Sit up straight. 2. Place your feet on the floor. Do not cross your ankles or legs. 3. Rest your left arm at the level of your heart. You may rest it on a table, desk, or chair. 4. Pull up your shirt sleeve. 5. Wrap the blood pressure cuff around the upper part of your left arm. The cuff should be 1 inch (2.5 cm) above your elbow. It is best to wrap the cuff around bare  skin. 6. Fit the cuff snugly around your arm. You should be able to place only one finger between the cuff and your arm. 7. Put the cord inside the groove of your elbow. 8. Press the power button. 9. Sit quietly while the cuff fills with air and loses air. 10. Write down the numbers on the screen. 11. Wait 2-3 minutes and then repeat steps 1-10.  What do the numbers mean? Two numbers make up your blood pressure. The first number is called systolic pressure. The second is called diastolic pressure. An example of a blood pressure reading is "120 over 80" (or 120/80). If you are an adult and do not have a medical condition, use this guide to find out if your blood pressure is normal: Normal  First number: below 120.  Second number: below 80. Elevated  First number: 120-129.  Second number: below 80. Hypertension stage 1  First number: 130-139.  Second number: 80-89. Hypertension stage 2  First number: 140 or above.  Second number: 70 or above. Your blood pressure is above normal even if only the top or bottom number is above normal. Follow these instructions at home:  Check your blood pressure as often as your doctor tells you to.  Take your monitor to your next doctor's appointment. Your doctor will: ? Make sure you are using it correctly. ? Make  sure it is working right.  Make sure you understand what your blood pressure numbers should be.  Tell your doctor if your medicines are causing side effects. Contact a doctor if:  Your blood pressure keeps being high. Get help right away if:  Your first blood pressure number is higher than 180.  Your second blood pressure number is higher than 120. This information is not intended to replace advice given to you by your health care provider. Make sure you discuss any questions you have with your health care provider. Document Released: 09/11/2008 Document Revised: 08/27/2016 Document Reviewed: 03/07/2016 Elsevier Interactive  Patient Education  [employer].    If you have lab work done today you will be contacted with your lab results within the next 2 weeks.  If you have not heard from [place] then please contact us. The fastest way to get your results is to register for My Chart.   IF you received an x-ray today, you will receive an invoice from [facility]. Please contact [facility] at [phone] with questions or concerns regarding your invoice.   IF you received labwork today, you will receive an invoice from [place]. Please contact [facility] at [phone] with questions or concerns regarding your invoice.   Our billing staff will not be able to assist you with questions regarding bills from these companies.  You will be contacted with the lab results as soon as they are available. The fastest way to get your results is to activate your My Chart account. Instructions are located on the last page of this paperwork. If you have not heard from [place] regarding the results in 2 weeks, please contact this office.

## 2018-09-07 LAB — COMPREHENSIVE METABOLIC PANEL
ALT: 13 IU/L (ref 0–44)
AST: 17 IU/L (ref 0–40)
Albumin/Globulin Ratio: 1.5 (ref 1.2–2.2)
Albumin: 4.3 g/dL (ref 3.5–4.7)
Alkaline Phosphatase: 72 IU/L (ref 39–117)
BUN/Creatinine Ratio: 16 (ref 10–24)
BUN: 20 mg/dL (ref 8–27)
Bilirubin Total: 0.4 mg/dL (ref 0.0–1.2)
CO2: 20 mmol/L (ref 20–29)
Calcium: 10.7 mg/dL — ABNORMAL HIGH (ref 8.6–10.2)
Chloride: 106 mmol/L (ref 96–106)
Creatinine, Ser: 1.29 mg/dL — ABNORMAL HIGH (ref 0.76–1.27)
GFR calc Af Amer: 60 mL/min/{1.73_m2} (ref >59)
GFR calc non Af Amer: 52 mL/min/{1.73_m2} — ABNORMAL LOW (ref >59)
Globulin, Total: 2.9 g/dL (ref 1.5–4.5)
Glucose: 223 mg/dL — ABNORMAL HIGH (ref 65–99)
Potassium: 3.9 mmol/L (ref 3.5–5.2)
Sodium: 142 mmol/L (ref 134–144)
Total Protein: 7.2 g/dL (ref 6.0–8.5)

## 2018-09-07 LAB — LIPID PANEL
Chol/HDL Ratio: 3.1 ratio (ref 0.0–5.0)
Cholesterol, Total: 170 mg/dL (ref 100–199)
HDL: 55 mg/dL (ref >39)
LDL Calculated: 96 mg/dL (ref 0–99)
Triglycerides: 93 mg/dL (ref 0–149)
VLDL Cholesterol Cal: 19 mg/dL (ref 5–40)

## 2018-09-07 LAB — MICROALBUMIN / CREATININE URINE RATIO
Creatinine, Urine: 70.4 mg/dL
Microalb/Creat Ratio: 7.1 mg/g creat (ref 0.0–30.0)
Microalbumin, Urine: 5 ug/mL

## 2018-09-07 LAB — HEMOGLOBIN A1C

## 2018-10-01 ENCOUNTER — Ambulatory Visit (INDEPENDENT_AMBULATORY_CARE_PROVIDER_SITE_OTHER): Payer: Medicare Other | Admitting: Family Medicine

## 2018-10-01 DIAGNOSIS — E1122 Type 2 diabetes mellitus with diabetic chronic kidney disease: Secondary | ICD-10-CM | POA: Diagnosis not present

## 2018-10-01 NOTE — Progress Notes (Signed)
Labs only

## 2018-10-02 LAB — HEMOGLOBIN A1C
Est. average glucose Bld gHb Est-mCnc: 169 mg/dL
Hgb A1c MFr Bld: 7.5 % — ABNORMAL HIGH (ref 4.8–5.6)

## 2018-10-18 ENCOUNTER — Ambulatory Visit (INDEPENDENT_AMBULATORY_CARE_PROVIDER_SITE_OTHER): Payer: Medicare Other | Admitting: Family Medicine

## 2018-10-18 ENCOUNTER — Encounter: Payer: Self-pay | Admitting: Family Medicine

## 2018-10-18 ENCOUNTER — Other Ambulatory Visit: Payer: Self-pay

## 2018-10-18 VITALS — BP 125/80 | HR 58 | Temp 97.7°F | Resp 16 | Ht 73.0 in | Wt 173.8 lb

## 2018-10-18 DIAGNOSIS — E7800 Pure hypercholesterolemia, unspecified: Secondary | ICD-10-CM

## 2018-10-18 DIAGNOSIS — E1122 Type 2 diabetes mellitus with diabetic chronic kidney disease: Secondary | ICD-10-CM | POA: Diagnosis not present

## 2018-10-18 DIAGNOSIS — I10 Essential (primary) hypertension: Secondary | ICD-10-CM

## 2018-10-18 DIAGNOSIS — R202 Paresthesia of skin: Secondary | ICD-10-CM

## 2018-10-18 DIAGNOSIS — R200 Anesthesia of skin: Secondary | ICD-10-CM | POA: Diagnosis not present

## 2018-10-18 DIAGNOSIS — Z85038 Personal history of other malignant neoplasm of large intestine: Secondary | ICD-10-CM | POA: Diagnosis not present

## 2018-10-18 DIAGNOSIS — L989 Disorder of the skin and subcutaneous tissue, unspecified: Secondary | ICD-10-CM | POA: Diagnosis not present

## 2018-10-18 MED ORDER — GABAPENTIN 100 MG PO CAPS: 100.0000 mg | ORAL_CAPSULE | Freq: Every evening | ORAL | 0 refills | Status: DC | PRN

## 2018-10-18 NOTE — Patient Instructions (Addendum)
  Ok to continue same doses of meds for now. Diabetes was borderline controlled. We can recheck that in 3 months.  Blood pressure looks okay on current doses.  Tingling in the foot can be due to a number of causes, but sometimes that can be caused from either diabetes or a pinched nerve.  You can try the gabapentin 1 pill at bedtime as needed.  If that does not help he can try 2 pills at bedtime.  Please follow-up in the next few weeks if that is persistent, sooner if worse  I will refer you to gastroenterology, but if any further return of blood in the stool, be seen right away.  I will refer you to dermatology to evaluate the area under the nose.  If any bleeding, increased soreness, or increasing size, be seen sooner.  Return to the clinic or go to the nearest emergency room if any of your symptoms worsen or new symptoms occur.    If you have lab work done today you will be contacted with your lab results within the next 2 weeks.  If you have not heard from [place] then please contact us. The fastest way to get your results is to register for My Chart.   IF you received an x-ray today, you will receive an invoice from [facility]. Please contact [facility] at [phone] with questions or concerns regarding your invoice.   IF you received labwork today, you will receive an invoice from [place]. Please contact [facility] at [phone] with questions or concerns regarding your invoice.   Our billing staff will not be able to assist you with questions regarding bills from these companies.  You will be contacted with the lab results as soon as they are available. The fastest way to get your results is to activate your My Chart account. Instructions are located on the last page of this paperwork. If you have not heard from [place] regarding the results in 2 weeks, please contact this office.

## 2018-10-18 NOTE — Progress Notes (Signed)
Subjective:    Patient ID: Theodore Frank, male    DOB: 04/04/1936, 83 y.o.   MRN: [id number]  HPI Theodore Frank is a 83 y.o. male Presents today for: Chief Complaint  Patient presents with  . Follow-up    recheck blood pressure and discuss labs. Would like to discuss having mole removed on the right nostrils area, need a referral for colonoscopy  . Foot Pain    tingling sensastion in the right heel area   Hypertension: BP Readings from Last 3 Encounters:  10/18/18 125/80  09/06/18 (!) 142/80  02/23/18 116/74   Lab Results  Component Value Date   CREATININE 1.29 (H) 09/06/2018  Taking lisinopril HCTZ and Norvasc at last visit, but had run out of medication.  He did report some lightheadedness and nausea prior to running out of medications.  He was restarted on amlodipine, but decreased dose to 5 mg daily, continue same dose of lisinopril HCTZ. Feels good. Does not check home bp's.  No new side effects. Only missed 1 dose.    Hyperlipidemia:  Lab Results  Component Value Date   CHOL 170 09/06/2018   HDL 55 09/06/2018   LDLCALC 96 09/06/2018   TRIG 93 09/06/2018   CHOLHDL 3.1 09/06/2018   Lab Results  Component Value Date   ALT 13 09/06/2018   AST 17 09/06/2018   ALKPHOS 72 09/06/2018   BILITOT 0.4 09/06/2018  Previously had been on Lipitor, restarted 20 mg daily at last visit. Fasting today.  Taking lipitor daily, no new side effects.   Mole Under R nose - since 2004-2005. Has enlarged over past month since had dental implants. Would like to have removed. No drainage/discharge.   Diabetes with CKD.  Last seen in November.  Had cut back to metformin 500 mg once per day.  Had not been seen since May. Due for ophthalmology exam. Plans on calling to schedule today.   Still taking metformin once per day. No known side effects at this dose.  Tingling in R heel, past few days. NKI. No redness/wound/discharge. no numbness/tingling in left foot.    Diabetic Foot Exam  - Simple   No data filed       Lab Results  Component Value Date   HGBA1C 7.5 (H) 10/01/2018   HGBA1C CANCELED 09/06/2018   HGBA1C 7.9 (H) 02/23/2018   Lab Results  Component Value Date   MICROALBUR 0.72 03/21/2014   LDLCALC 96 09/06/2018   CREATININE 1.29 (H) 09/06/2018   Health maintenance: Colonoscopy in 2012 with hemorrhoids and diverticula.  Dr. Benson Norway. ? Thinks he was told to recheck in 3-4 years. Hx of colon cancer with surgery in 2001.     Patient Active Problem List   Diagnosis Date Noted  . COPD (chronic obstructive pulmonary disease) ([place]) 02/23/2018  . Dilation of thoracic aorta ([place]) 02/23/2018  . History of colon cancer 11/13/2016  . Anemia 05/06/2016  . Arthritis 05/06/2016  . Hypoglycemia 04/23/2016  . Chronic kidney disease, stage 3 ([place]) 04/23/2016  . HNP (herniated nucleus pulposus), lumbar 11/06/2014  . History of prostate cancer 04/05/2013  . S/P right inguinal hernia repair 04/02/2012  . Hypertension 01/30/2012  . Hyperthyroidism 01/30/2012  . Type 2 diabetes mellitus with complication, without long-term current use of insulin ([place]) 01/30/2012  . Hyperlipidemia 01/30/2012   Past Medical History:  Diagnosis Date  . Acute encephalopathy 04/23/2016  . Anemia   . Arthritis   . Cancer ([place]) 10/14/1999   colon cancer (surgery; no  chemo; no radiation)  . Diabetes mellitus   . Hyperlipidemia   . Hypertension   . Prostate cancer ([place]) 10/13/1192   s/p prostatectomy  . Right inguinal hernia 02/25/2012  . Shortness of breath dyspnea    with exertion   Past Surgical History:  Procedure Laterality Date  . COLON SURGERY  2001   colon resection for colon cancer  . HERNIA REPAIR Right   . INGUINAL HERNIA REPAIR  03/05/2012   Procedure: HERNIA REPAIR INGUINAL ADULT;  Surgeon: [name], MD;  Location: [facility];  Service: General;  Laterality: Right;  Repair right inguinal hernia with mesh  . LUMBAR LAMINECTOMY/DECOMPRESSION  MICRODISCECTOMY Left 11/06/2014   Procedure: LEFT LUMBAR THREE-FOUR LAMINECTOMY/DECOMPRESSION MICRODISCECTOMY 1 LEVEL;  Surgeon: [name], MD;  Location: [facility];  Service: Neurosurgery;  Laterality: Left;  . PROSTATE SURGERY     prostatectomy for prostate cancer   No Known Allergies Prior to Admission medications   Medication Sig Start Date End Date Taking? Authorizing Provider  amLODipine (NORVASC) 5 MG tablet Take 1 tablet (5 mg total) by mouth daily. 09/06/18  Yes Wendie Agreste, MD  atorvastatin (LIPITOR) 20 MG tablet Take 1 tablet (20 mg total) by mouth daily. 09/06/18  Yes Wendie Agreste, MD  lisinopril-hydrochlorothiazide (PRINZIDE,ZESTORETIC) 20-12.5 MG tablet Take 1 tablet by mouth daily. 09/06/18  Yes Wendie Agreste, MD  metFORMIN (GLUCOPHAGE) 500 MG tablet Take 1 tablet (500 mg total) by mouth daily with breakfast. 09/06/18  Yes Wendie Agreste, MD  vitamin B-12 (CYANOCOBALAMIN) 1000 MCG tablet Take 1,000 mcg by mouth daily.   Yes [provider]   Social History   Socioeconomic History  . Marital status: Divorced    Spouse name: Not on file  . Number of children: 4  . Years of education: 27  . Highest education level: Not on file  Occupational History  . Occupation: Training and development officer  Social Needs  . Financial resource strain: Not hard at all  . Food insecurity:    Worry: Never true    Inability: Never true  . Transportation needs:    Medical: No    Non-medical: No  Tobacco Use  . Smoking status: Former Smoker    Last attempt to quit: 10/13/1965    Years since quitting: 53.0  . Smokeless tobacco: Never Used  Substance and Sexual Activity  . Alcohol use: No  . Drug use: No  . Sexual activity: Yes    Birth control/protection: None  Lifestyle  . Physical activity:    Days per week: 0 days    Minutes per session: 0 min  . Stress: Not at all  Relationships  . Social connections:    Talks on phone: Never    Gets together: Never    Attends  religious service: More than 4 times per year    Active member of club or organization: No    Attends meetings of clubs or organizations: Never    Relationship status: Divorced  . Intimate partner violence:    Fear of current or ex partner: No    Emotionally abused: No    Physically abused: No    Forced sexual activity: No  Other Topics Concern  . Not on file  Social History Narrative   Marital status: divorced; single in 2018; not dating      Children: 4 total children; 1 son died of AIDS; 3 living children       Live:Lives alone  Employment: truck Geophysicist/field seismologist; Estate manager/land agent; Building surveyor; local      Tobacco: previous smoker; quit in 1967      Alcohol: none      Exercise: none; scared to exercise      ADLs: independent with ADLs; drives      Advanced Directives:  None; desires FULL CODE         Right-handed   Only occasional coffee or hot tea     Review of Systems  Constitutional: Negative for fatigue and unexpected weight change.  Eyes: Negative for visual disturbance.  Respiratory: Negative for cough, chest tightness and shortness of breath.   Cardiovascular: Negative for chest pain, palpitations and leg swelling.  Gastrointestinal: Negative for abdominal pain and blood in stool.  Neurological: Negative for dizziness, light-headedness and headaches.       Objective:   Physical Exam Vitals signs reviewed.  Constitutional:      Appearance: He is well-developed.  HENT:     Head: Normocephalic and atraumatic.   Eyes:     Pupils: Pupils are equal, round, and reactive to light.  Neck:     Vascular: No carotid bruit or JVD.  Cardiovascular:     Rate and Rhythm: Normal rate and regular rhythm.     Heart sounds: Normal heart sounds. No murmur.  Pulmonary:     Effort: Pulmonary effort is normal.     Breath sounds: Normal breath sounds. No rales.  Skin:    General: Skin is warm and dry.  Neurological:     Mental Status: He is alert and oriented to person,  place, and time.   Decreased sensation plantar heel, skin intact, distal foot appears normal.  Plantar fascia nontender.   Vitals:   10/18/18 0907  BP: 125/80  Pulse: (!) 58  Resp: 16  Temp: 97.7 F (36.5 C)  TempSrc: Oral  SpO2: 97%  Weight: 173 lb 12.8 oz (78.8 kg)  Height: 6\' 1"  (1.854 m)          Assessment & Plan:   Lillian Tigges is a 83 y.o. male Essential hypertension  -  Stable, tolerating current regimen. Medications refilled. Labs pending as above.   History of colon cancer - Plan: Ambulatory referral to Gastroenterology  -Reports single episode of blood in stool.  Has not had recent GI eval.  Refer back to gastroenterology to determine imaging/follow-up.  Pure hypercholesterolemia  -Tolerating Lipitor, continue same dose.   Numbness and tingling of foot - Plan: gabapentin (NEURONTIN) 100 MG capsule  -Focal area on right foot, could be neuropathy from diabetes.  Trial of gabapentin.  If persistent follow-up to discuss other potential causes including lumbar source but less likely given distribution.  Type 2 diabetes mellitus with chronic kidney disease, without long-term current use of insulin, unspecified CKD stage ([place])  -Borderline control previously, continue same regimen for now, monitor diet and repeat testing in 3 months.  Lesion of face - Plan: Ambulatory referral to Dermatology  -Possible irritated mole/nevus with hyperpigmentation versus bruising at periphery.  No sign of infection at this time.  Refer to dermatology for evaluation and likely removal given increased size  Meds ordered this encounter  Medications  . gabapentin (NEURONTIN) 100 MG capsule    Sig: Take 1-2 capsules (100-200 mg total) by mouth at bedtime as needed.    Dispense:  30 capsule    Refill:  0   Patient Instructions    Ok to continue same doses of meds for now. Diabetes was  borderline controlled. We can recheck that in 3 months.  Blood pressure looks okay on current  doses.  Tingling in the foot can be due to a number of causes, but sometimes that can be caused from either diabetes or a pinched nerve.  You can try the gabapentin 1 pill at bedtime as needed.  If that does not help he can try 2 pills at bedtime.  Please follow-up in the next few weeks if that is persistent, sooner if worse  I will refer you to gastroenterology, but if any further return of blood in the stool, be seen right away.  I will refer you to dermatology to evaluate the area under the nose.  If any bleeding, increased soreness, or increasing size, be seen sooner.  Return to the clinic or go to the nearest emergency room if any of your symptoms worsen or new symptoms occur.    If you have lab work done today you will be contacted with your lab results within the next 2 weeks.  If you have not heard from [place] then please contact us. The fastest way to get your results is to register for My Chart.   IF you received an x-ray today, you will receive an invoice from [facility]. Please contact [facility] at [phone] with questions or concerns regarding your invoice.   IF you received labwork today, you will receive an invoice from [place]. Please contact [facility] at [phone] with questions or concerns regarding your invoice.   Our billing staff will not be able to assist you with questions regarding bills from these companies.  You will be contacted with the lab results as soon as they are available. The fastest way to get your results is to activate your My Chart account. Instructions are located on the last page of this paperwork. If you have not heard from [place] regarding the results in 2 weeks, please contact this office.       Signed,   Merri Ray, MD [facility] [place].  10/19/18 12:33 PM

## 2018-10-19 ENCOUNTER — Encounter: Payer: Self-pay | Admitting: Family Medicine

## 2018-11-22 ENCOUNTER — Ambulatory Visit: Payer: Self-pay | Admitting: *Deleted

## 2018-11-22 ENCOUNTER — Telehealth: Payer: Self-pay | Admitting: *Deleted

## 2018-11-22 NOTE — Telephone Encounter (Signed)
Pt reports BS a few minutes prior to call 421. States this last night 324, 321 with breakfast this am. States he has not been checking BS "For a long time" as he just found his home monitor. Reports blurred vision, mild headache. States "I always urinate a lot because I drink a lot of water, but probably going more than usual." Pt reports he has been taking his metformin as ordered but did not take this morning; "I was worried." TN called practice per disposition, spoke with Mariann Laster who alerted Dr.Greene; recommended pt go to ED. Pt states he will do so. States he will drive as he has no one to drive him; offered EMS, declined. States "I'm ok to drive." Instructed to pull off road and call EMS if needed while in route. Pt verbalizes understanding.   Reason for Disposition . Blood glucose > 400 mg/dL (22.2 mmol/L)  Answer Assessment - Initial Assessment Questions 1. BLOOD GLUCOSE: "What is your blood glucose level?"      421 2. ONSET: "When did you check the blood glucose?"     1515 3. USUAL RANGE: "What is your glucose level usually?" (e.g., usual fasting morning value, usual evening value)    120 4. KETONES: "Do you check for ketones (urine or blood test strips)?" If yes, ask: "What does the test show now?"      no 5. TYPE 1 or 2:  "Do you know what type of diabetes you have?"  (e.g., Type 1, Type 2, Gestational; doesn't know)      Type 2 6. INSULIN: "Do you take insulin?" "What type of insulin(s) do you use? What is the mode of delivery? (syringe, pen (e.g., injection or  pump)?"     no 7. DIABETES PILLS: "Do you take any pills for your diabetes?" If yes, ask: "Have you missed taking any pills recently?"    Yes, no missed doses 8. OTHER SYMPTOMS: "Do you have any symptoms?" (e.g., fever, frequent urination, difficulty breathing, dizziness, weakness, vomiting)     Blurred vision, frequent urination, mild headache  Protocols used: DIABETES - HIGH BLOOD SUGAR-A-AH

## 2018-11-22 NOTE — Telephone Encounter (Signed)
Spoke with Audrea Muscat, calling regarding patient blood sugar 421, she stated that he said he haven't checked blood sugars in a long time. It was 321 last night, 321 this am and 421 again awhile ago. She also, stated that patient hasn't taken his Glucophage today. Pt is having symptoms of blurred vision, he drinks a lot of water, but having more frequent urination, and mild headache. Audrea Muscat was advised to have patient go to ED, if he is unable to drive or doesn't have anyone to drive him, to call EMS.

## 2018-11-23 NOTE — Telephone Encounter (Signed)
FYI to provider

## 2018-11-24 ENCOUNTER — Telehealth: Payer: Self-pay | Admitting: Family Medicine

## 2018-11-24 NOTE — Telephone Encounter (Signed)
He never went to ER that I can see on epic. Appt in April is not appropriate. Please schedule for this week. Thanks

## 2018-11-24 NOTE — Telephone Encounter (Signed)
PER DR. SANTIAGO - I made an appt for pt Friday 2/14 at 4:00 pm. I was able to talk to the pt and confirm the appt. Pt acknowledged.

## 2018-11-26 ENCOUNTER — Ambulatory Visit (INDEPENDENT_AMBULATORY_CARE_PROVIDER_SITE_OTHER): Payer: Medicare Other | Admitting: Family Medicine

## 2018-11-26 ENCOUNTER — Encounter: Payer: Self-pay | Admitting: Family Medicine

## 2018-11-26 ENCOUNTER — Other Ambulatory Visit: Payer: Self-pay

## 2018-11-26 VITALS — BP 139/78 | HR 68 | Temp 98.0°F | Resp 16 | Ht 73.0 in | Wt 177.0 lb

## 2018-11-26 DIAGNOSIS — E1122 Type 2 diabetes mellitus with diabetic chronic kidney disease: Secondary | ICD-10-CM | POA: Diagnosis not present

## 2018-11-26 DIAGNOSIS — F4321 Adjustment disorder with depressed mood: Secondary | ICD-10-CM

## 2018-11-26 DIAGNOSIS — R739 Hyperglycemia, unspecified: Secondary | ICD-10-CM

## 2018-11-26 LAB — GLUCOSE, POCT (MANUAL RESULT ENTRY): POC Glucose: 242 mg/dl — AB (ref 70–99)

## 2018-11-26 LAB — POCT URINALYSIS DIP (MANUAL ENTRY)
Bilirubin, UA: NEGATIVE
Blood, UA: NEGATIVE
Glucose, UA: 500 mg/dL — AB
Ketones, POC UA: NEGATIVE mg/dL
Leukocytes, UA: NEGATIVE
Nitrite, UA: NEGATIVE
Protein Ur, POC: NEGATIVE mg/dL
Spec Grav, UA: 1.02 (ref 1.010–1.025)
Urobilinogen, UA: 0.2 E.U./dL
pH, UA: 5.5 (ref 5.0–8.0)

## 2018-11-26 LAB — POCT GLYCOSYLATED HEMOGLOBIN (HGB A1C): Hemoglobin A1C: 8.2 % — AB (ref 4.0–5.6)

## 2018-11-26 MED ORDER — METFORMIN HCL 500 MG PO TABS: 500.0000 mg | ORAL_TABLET | Freq: Two times a day (BID) | ORAL | 0 refills | Status: DC

## 2018-11-26 NOTE — Patient Instructions (Addendum)
   If you have lab work done today you will be contacted with your lab results within the next 2 weeks.  If you have not heard from us then please contact us. The fastest way to get your results is to register for My Chart.   IF you received an x-ray today, you will receive an invoice from [facility]. Please contact [facility] at [phone] with questions or concerns regarding your invoice.   IF you received labwork today, you will receive an invoice from [facility]. Please contact [facility] at [phone] with questions or concerns regarding your invoice.   Our billing staff will not be able to assist you with questions regarding bills from these companies.  You will be contacted with the lab results as soon as they are available. The fastest way to get your results is to activate your My Chart account. Instructions are located on the last page of this paperwork. If you have not heard from us regarding the results in 2 weeks, please contact this office.     Diabetes Mellitus and Nutrition, Adult When you have diabetes (diabetes mellitus), it is very important to have healthy eating habits because your blood sugar (glucose) levels are greatly affected by what you eat and drink. Eating healthy foods in the appropriate amounts, at about the same times every day, can help you:  Control your blood glucose.  Lower your risk of heart disease.  Improve your blood pressure.  Reach or maintain a healthy weight. Every person with diabetes is different, and each person has different needs for a meal plan. Your health care provider may recommend that you work with a diet and nutrition specialist (dietitian) to make a meal plan that is best for you. Your meal plan may vary depending on factors such as:  The calories you need.  The medicines you take.  Your weight.  Your blood glucose, blood pressure, and cholesterol levels.  Your activity level.  Other health conditions  you have, such as heart or kidney disease. How do carbohydrates affect me? Carbohydrates, also called carbs, affect your blood glucose level more than any other type of food. Eating carbs naturally raises the amount of glucose in your blood. Carb counting is a method for keeping track of how many carbs you eat. Counting carbs is important to keep your blood glucose at a healthy level, especially if you use insulin or take certain oral diabetes medicines. It is important to know how many carbs you can safely have in each meal. This is different for every person. Your dietitian can help you calculate how many carbs you should have at each meal and for each snack. Foods that contain carbs include:  Bread, cereal, rice, pasta, and crackers.  Potatoes and corn.  Peas, beans, and lentils.  Milk and yogurt.  Fruit and juice.  Desserts, such as cakes, cookies, ice cream, and candy. How does alcohol affect me? Alcohol can cause a sudden decrease in blood glucose (hypoglycemia), especially if you use insulin or take certain oral diabetes medicines. Hypoglycemia can be a life-threatening condition. Symptoms of hypoglycemia (sleepiness, dizziness, and confusion) are similar to symptoms of having too much alcohol. If your health care provider says that alcohol is safe for you, follow these guidelines:  Limit alcohol intake to no more than 1 drink per day for nonpregnant women and 2 drinks per day for men. One drink equals 12 oz of beer, 5 oz of wine, or 1 oz of hard liquor.    Do not drink on an empty stomach.  Keep yourself hydrated with water, diet soda, or unsweetened iced tea.  Keep in mind that regular soda, juice, and other mixers may contain a lot of sugar and must be counted as carbs. What are tips for following this plan?  Reading food labels  Start by checking the serving size on the "Nutrition Facts" label of packaged foods and drinks. The amount of calories, carbs, fats, and other  nutrients listed on the label is based on one serving of the item. Many items contain more than one serving per package.  Check the total grams (g) of carbs in one serving. You can calculate the number of servings of carbs in one serving by dividing the total carbs by 15. For example, if a food has 30 g of total carbs, it would be equal to 2 servings of carbs.  Check the number of grams (g) of saturated and trans fats in one serving. Choose foods that have low or no amount of these fats.  Check the number of milligrams (mg) of salt (sodium) in one serving. Most people should limit total sodium intake to less than 2,300 mg per day.  Always check the nutrition information of foods labeled as "low-fat" or "nonfat". These foods may be higher in added sugar or refined carbs and should be avoided.  Talk to your dietitian to identify your daily goals for nutrients listed on the label. Shopping  Avoid buying canned, premade, or processed foods. These foods tend to be high in fat, sodium, and added sugar.  Shop around the outside edge of the grocery store. This includes fresh fruits and vegetables, bulk grains, fresh meats, and fresh dairy. Cooking  Use low-heat cooking methods, such as baking, instead of high-heat cooking methods like deep frying.  Cook using healthy oils, such as olive, canola, or sunflower oil.  Avoid cooking with butter, cream, or high-fat meats. Meal planning  Eat meals and snacks regularly, preferably at the same times every day. Avoid going long periods of time without eating.  Eat foods high in fiber, such as fresh fruits, vegetables, beans, and whole grains. Talk to your dietitian about how many servings of carbs you can eat at each meal.  Eat 4-6 ounces (oz) of lean protein each day, such as lean meat, chicken, fish, eggs, or tofu. One oz of lean protein is equal to: ? 1 oz of meat, chicken, or fish. ? 1 egg. ?  cup of tofu.  Eat some foods each day that contain  healthy fats, such as avocado, nuts, seeds, and fish. Lifestyle  Check your blood glucose regularly.  Exercise regularly as told by your health care provider. This may include: ? 150 minutes of moderate-intensity or vigorous-intensity exercise each week. This could be brisk walking, biking, or water aerobics. ? Stretching and doing strength exercises, such as yoga or weightlifting, at least 2 times a week.  Take medicines as told by your health care provider.  Do not use any products that contain nicotine or tobacco, such as cigarettes and e-cigarettes. If you need help quitting, ask your health care provider.  Work with a counselor or diabetes educator to identify strategies to manage stress and any emotional and social challenges. Questions to ask a health care provider  Do I need to meet with a diabetes educator?  Do I need to meet with a dietitian?  What number can I call if I have questions?  When are the best times to   check my blood glucose? Where to find more information:  American Diabetes Association: diabetes.org  Academy of Nutrition and Dietetics: www.eatright.org  National Institute of Diabetes and Digestive and Kidney Diseases (NIH): www.niddk.nih.gov Summary  A healthy meal plan will help you control your blood glucose and maintain a healthy lifestyle.  Working with a diet and nutrition specialist (dietitian) can help you make a meal plan that is best for you.  Keep in mind that carbohydrates (carbs) and alcohol have immediate effects on your blood glucose levels. It is important to count carbs and to use alcohol carefully. This information is not intended to replace advice given to you by your health care provider. Make sure you discuss any questions you have with your health care provider. Document Released: 06/26/2005 Document Revised: 04/29/2017 Document Reviewed: 11/03/2016 Elsevier Interactive Patient Education  2019 Elsevier Inc.  

## 2018-11-26 NOTE — Progress Notes (Signed)
2/14/20204:22 PM  Theodore Frank 06-06-1936, 83 y.o. male [id number]  Chief Complaint  Patient presents with  . Hyperglycemia    pt states he has been having High glucose reading x 2 weeks   . Dizziness    HPI:   Patient is a 83 y.o. 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 0 0 0 No No  Injury with Fall? 0 - - - -     Depression screen [facility] 2/9 11/26/2018 10/18/2018 09/06/2018  Decreased Interest 0 0 0  Down, Depressed, Hopeless 0 0 0  PHQ - 2 Score 0 0 0    No Known Allergies  Prior to Admission medications   Medication Sig Start Date End Date Taking? Authorizing Provider  amLODipine (NORVASC) 5 MG tablet Take 1 tablet (5 mg total) by mouth daily. 09/06/18  Yes Wendie Agreste, MD  atorvastatin (LIPITOR) 20 MG tablet Take 1 tablet (20 mg total) by mouth daily. 09/06/18  Yes Wendie Agreste, MD  gabapentin (NEURONTIN) 100 MG capsule Take 1-2 capsules (100-200 mg total) by mouth at bedtime as needed. 10/18/18  Yes Wendie Agreste, MD  lisinopril-hydrochlorothiazide  (PRINZIDE,ZESTORETIC) 20-12.5 MG tablet Take 1 tablet by mouth daily. 09/06/18  Yes Wendie Agreste, MD  metFORMIN (GLUCOPHAGE) 500 MG tablet Take 1 tablet (500 mg total) by mouth daily with breakfast. 09/06/18  Yes Wendie Agreste, MD  vitamin B-12 (CYANOCOBALAMIN) 1000 MCG tablet Take 1,000 mcg by mouth daily.   Yes [provider]    Past Medical History:  Diagnosis Date  . Acute encephalopathy 04/23/2016  . Anemia   . Arthritis   . Cancer ([place]) 10/14/1999   colon cancer (surgery; no chemo; no radiation)  . Diabetes mellitus   . Hyperlipidemia   . Hypertension   . Prostate cancer ([place]) 10/13/1192   s/p prostatectomy  . Right inguinal hernia 02/25/2012  . Shortness of breath dyspnea    with exertion    Past Surgical History:  Procedure Laterality Date  . COLON SURGERY  2001   colon resection for colon cancer  . HERNIA REPAIR Right   . INGUINAL HERNIA REPAIR  03/05/2012   Procedure: HERNIA REPAIR INGUINAL ADULT;  Surgeon: [name], MD;  Location: [facility];  Service: General;  Laterality: Right;  Repair right inguinal hernia with mesh  . LUMBAR LAMINECTOMY/DECOMPRESSION MICRODISCECTOMY Left 11/06/2014   Procedure: LEFT LUMBAR THREE-FOUR LAMINECTOMY/DECOMPRESSION MICRODISCECTOMY 1 LEVEL;  Surgeon: [name], MD;  Location: [facility];  Service:  Neurosurgery;  Laterality: Left;  . PROSTATE SURGERY     prostatectomy for prostate cancer    Social History   Tobacco Use  . Smoking status: Former Smoker    Last attempt to quit: 10/13/1965    Years since quitting: 53.1  . Smokeless tobacco: Never Used  Substance Use Topics  . Alcohol use: No    Family History  Problem Relation Age of Onset  . Hypertension Mother   . Thyroid disease Sister   . Diabetes Other     Review of Systems  Constitutional: Negative for chills and fever.  Respiratory: Negative for cough and shortness of breath.   Cardiovascular: Negative for chest pain,  palpitations and leg swelling.  Gastrointestinal: Negative for abdominal pain, nausea and vomiting.  Neurological: Positive for dizziness.  Endo/Heme/Allergies: Positive for polydipsia.     OBJECTIVE:  Blood pressure 139/78, pulse 68, temperature 98 F (36.7 C), temperature source Oral, resp. rate 16, height 6\' 1"  (1.854 m), weight 177 lb (80.3 kg), SpO2 97 %. Body mass index is 23.35 kg/m.   Physical Exam Vitals signs and nursing note reviewed.  Constitutional:      Appearance: He is well-developed.  HENT:     Head: Normocephalic and atraumatic.  Eyes:     Conjunctiva/sclera: Conjunctivae normal.     Pupils: Pupils are equal, round, and reactive to light.  Neck:     Musculoskeletal: Neck supple.  Cardiovascular:     Rate and Rhythm: Normal rate and regular rhythm.     Heart sounds: No murmur. No friction rub. No gallop.   Pulmonary:     Effort: Pulmonary effort is normal.     Breath sounds: Normal breath sounds. No wheezing or rales.  Skin:    General: Skin is warm and dry.  Neurological:     Mental Status: He is alert and oriented to person, place, and time.       Results for orders placed or performed in visit on 11/26/18 (from the past 24 hour(s))  POCT urinalysis dipstick     Status: Abnormal   Collection Time: 11/26/18  4:30 PM  Result Value Ref Range   Color, UA yellow yellow   Clarity, UA clear clear   Glucose, UA =500 (A) negative mg/dL   Bilirubin, UA negative negative   Ketones, POC UA negative negative mg/dL   Spec Grav, UA 1.020 1.010 - 1.025   Blood, UA negative negative   pH, UA 5.5 5.0 - 8.0   Protein Ur, POC negative negative mg/dL   Urobilinogen, UA 0.2 0.2 or 1.0 E.U./dL   Nitrite, UA Negative Negative   Leukocytes, UA Negative Negative  POCT glucose (manual entry)     Status: Abnormal   Collection Time: 11/26/18  4:35 PM  Result Value Ref Range   POC Glucose 242 (A) 70 - 99 mg/dl  POCT A1C     Status: Abnormal   Collection Time: 11/26/18   4:48 PM  Result Value Ref Range   Hemoglobin A1C 8.2 (A) 4.0 - 5.6 %   HbA1c POC (<> result, manual entry)     HbA1c, POC (prediabetic range)     HbA1c, POC (controlled diabetic range)        ASSESSMENT and PLAN  1. Type 2 diabetes mellitus with chronic kidney disease, without long-term current use of insulin, unspecified CKD stage (HCC) Uncontrolled. More than 50% of this 25 min visit was spent on counseling and coordination of care. Recent hyperglycemia in setting of dietary  indiscretions. Discussed diet, exercise, increase socialization, explored options for gym memberships, bowling leagues. Discussed counseling - would like to defer at this time. Continue checking cbgs twice a day. Increasing metformin to bid. RTC precautions given.  - POCT urinalysis dipstick - POCT glucose (manual entry)  2. Hyperglycemia - POCT urinalysis dipstick - POCT glucose (manual entry)    Return in about 2 weeks (around 12/10/2018).    Rutherford Guys, MD Primary Care at [place], [place] [address] Ph.  [phone] Fax [phone]

## 2018-11-27 LAB — COMPREHENSIVE METABOLIC PANEL
ALT: 14 IU/L (ref 0–44)
AST: 16 IU/L (ref 0–40)
Albumin/Globulin Ratio: 1.2 (ref 1.2–2.2)
Albumin: 4.2 g/dL (ref 3.7–4.7)
Alkaline Phosphatase: 62 IU/L (ref 39–117)
BUN/Creatinine Ratio: 17 (ref 10–24)
BUN: 22 mg/dL (ref 8–27)
Bilirubin Total: 0.5 mg/dL (ref 0.0–1.2)
CO2: 24 mmol/L (ref 20–29)
Calcium: 11.6 mg/dL — ABNORMAL HIGH (ref 8.6–10.2)
Chloride: 100 mmol/L (ref 96–106)
Creatinine, Ser: 1.28 mg/dL — ABNORMAL HIGH (ref 0.76–1.27)
GFR calc Af Amer: 61 mL/min/{1.73_m2} (ref >59)
GFR calc non Af Amer: 53 mL/min/{1.73_m2} — ABNORMAL LOW (ref >59)
Globulin, Total: 3.4 g/dL (ref 1.5–4.5)
Glucose: 241 mg/dL — ABNORMAL HIGH (ref 65–99)
Potassium: 4.2 mmol/L (ref 3.5–5.2)
Sodium: 139 mmol/L (ref 134–144)
Total Protein: 7.6 g/dL (ref 6.0–8.5)

## 2018-12-07 DIAGNOSIS — Z86010 Personal history of colonic polyps: Secondary | ICD-10-CM | POA: Diagnosis not present

## 2018-12-07 DIAGNOSIS — Z85030 Personal history of malignant carcinoid tumor of large intestine: Secondary | ICD-10-CM | POA: Diagnosis not present

## 2018-12-07 DIAGNOSIS — Z1211 Encounter for screening for malignant neoplasm of colon: Secondary | ICD-10-CM | POA: Diagnosis not present

## 2018-12-07 DIAGNOSIS — K5730 Diverticulosis of large intestine without perforation or abscess without bleeding: Secondary | ICD-10-CM | POA: Diagnosis not present

## 2018-12-07 MED ORDER — LISINOPRIL 20 MG PO TABS: 20.0000 mg | ORAL_TABLET | Freq: Every day | ORAL | 1 refills | Status: DC

## 2018-12-07 NOTE — Addendum Note (Signed)
Addended by: Rutherford Guys on: 12/07/2018 11:38 PM   Modules accepted: Orders

## 2018-12-15 ENCOUNTER — Other Ambulatory Visit: Payer: Self-pay

## 2018-12-15 ENCOUNTER — Encounter: Payer: Self-pay | Admitting: Family Medicine

## 2018-12-15 ENCOUNTER — Ambulatory Visit (INDEPENDENT_AMBULATORY_CARE_PROVIDER_SITE_OTHER): Payer: Medicare Other | Admitting: Family Medicine

## 2018-12-15 VITALS — BP 130/82 | HR 72 | Temp 97.6°F | Ht 73.0 in | Wt 175.0 lb

## 2018-12-15 DIAGNOSIS — R739 Hyperglycemia, unspecified: Secondary | ICD-10-CM

## 2018-12-15 DIAGNOSIS — E118 Type 2 diabetes mellitus with unspecified complications: Secondary | ICD-10-CM | POA: Diagnosis not present

## 2018-12-15 LAB — POCT URINALYSIS DIP (MANUAL ENTRY)
Bilirubin, UA: NEGATIVE
Blood, UA: NEGATIVE
Glucose, UA: 500 mg/dL — AB
Ketones, POC UA: NEGATIVE mg/dL
Leukocytes, UA: NEGATIVE
Nitrite, UA: NEGATIVE
Protein Ur, POC: NEGATIVE mg/dL
Spec Grav, UA: 1.015 (ref 1.010–1.025)
Urobilinogen, UA: 0.2 E.U./dL
pH, UA: 6 (ref 5.0–8.0)

## 2018-12-15 LAB — GLUCOSE, POCT (MANUAL RESULT ENTRY): POC Glucose: 328 mg/dl — AB (ref 70–99)

## 2018-12-15 MED ORDER — SITAGLIPTIN PHOSPHATE 100 MG PO TABS: 100.0000 mg | ORAL_TABLET | Freq: Every day | ORAL | 1 refills | Status: DC

## 2018-12-15 NOTE — Progress Notes (Signed)
3/4/202011:04 AM  Aris Lot 1936-03-28, 83 y.o. male [id number]  Chief Complaint  Patient presents with  . Diabetes    follow up on blood sugar    HPI:   Patient is a 83 y.o. male with past medical history significant for HTN, DM2, CKD, HLP, prostate cancer who presents today for followup  Has increased metformin to BID Has been checking sugars and BP daily cbgs fasting: 190, 168, 129 - they have been coming down BP 124/83, 132/84, 138/84 Had tuna fish sandwich with frank and beans and gingerale last night Has been cutting back sign on sweets Denies any sx of hypoglycemia but feeling a bit dizzy with blurry vision Feeling tired, has no energy Has not taken metformin today, had breakfast this morning  Saw  Urology recently, checked PSA Patient reports - everything is good  Will be coming in on Monday for hypercalcemia labs Has received lisinopril 20mg    Lab Results  Component Value Date   CALCIUM 11.6 (H) 11/26/2018    Lab Results  Component Value Date   HGBA1C 8.2 (A) 11/26/2018   HGBA1C 7.5 (H) 10/01/2018   HGBA1C CANCELED 09/06/2018   Lab Results  Component Value Date   MICROALBUR 0.72 03/21/2014   LDLCALC 96 09/06/2018   CREATININE 1.28 (H) 11/26/2018  GFR 61   Fall Risk  12/15/2018 11/26/2018 10/18/2018 09/06/2018 02/23/2018  Falls in the past year? 0 0 0 0 No  Number falls in past yr: 0 - - - -  Injury with Fall? 0 0 - - -     Depression screen [facility] 2/9 12/15/2018 11/26/2018 10/18/2018  Decreased Interest 0 0 0  Down, Depressed, Hopeless 0 0 0  PHQ - 2 Score 0 0 0    No Known Allergies  Prior to Admission medications   Medication Sig Start Date End Date Taking? Authorizing Provider  amLODipine (NORVASC) 5 MG tablet Take 1 tablet (5 mg total) by mouth daily. 09/06/18  Yes Wendie Agreste, MD  atorvastatin (LIPITOR) 20 MG tablet Take 1 tablet (20 mg total) by mouth daily. 09/06/18  Yes Wendie Agreste, MD  gabapentin (NEURONTIN) 100 MG capsule  Take 1-2 capsules (100-200 mg total) by mouth at bedtime as needed. 10/18/18  Yes Wendie Agreste, MD  lisinopril (PRINIVIL,ZESTRIL) 20 MG tablet Take 1 tablet (20 mg total) by mouth daily. 12/07/18  Yes Rutherford Guys, MD  metFORMIN (GLUCOPHAGE) 500 MG tablet Take 1 tablet (500 mg total) by mouth 2 (two) times daily with a meal. 11/26/18  Yes Rutherford Guys, MD  vitamin B-12 (CYANOCOBALAMIN) 1000 MCG tablet Take 1,000 mcg by mouth daily.   Yes [provider]    Past Medical History:  Diagnosis Date  . Acute encephalopathy 04/23/2016  . Anemia   . Arthritis   . Cancer ([place]) 10/14/1999   colon cancer (surgery; no chemo; no radiation)  . Diabetes mellitus   . Hyperlipidemia   . Hypertension   . Prostate cancer ([place]) 10/13/1192   s/p prostatectomy  . Right inguinal hernia 02/25/2012  . Shortness of breath dyspnea    with exertion    Past Surgical History:  Procedure Laterality Date  . COLON SURGERY  2001   colon resection for colon cancer  . HERNIA REPAIR Right   . INGUINAL HERNIA REPAIR  03/05/2012   Procedure: HERNIA REPAIR INGUINAL ADULT;  Surgeon: [name], MD;  Location: [facility];  Service: General;  Laterality: Right;  Repair right  inguinal hernia with mesh  . LUMBAR LAMINECTOMY/DECOMPRESSION MICRODISCECTOMY Left 11/06/2014   Procedure: LEFT LUMBAR THREE-FOUR LAMINECTOMY/DECOMPRESSION MICRODISCECTOMY 1 LEVEL;  Surgeon: [name], MD;  Location: [facility];  Service: Neurosurgery;  Laterality: Left;  . PROSTATE SURGERY     prostatectomy for prostate cancer    Social History   Tobacco Use  . Smoking status: Former Smoker    Last attempt to quit: 10/13/1965    Years since quitting: 53.2  . Smokeless tobacco: Never Used  Substance Use Topics  . Alcohol use: No    Family History  Problem Relation Age of Onset  . Hypertension Mother   . Thyroid disease Sister   . Diabetes Other     Review of Systems  Constitutional: Negative for  chills and fever.  Respiratory: Negative for cough and shortness of breath.   Cardiovascular: Negative for chest pain, palpitations and leg swelling.  Gastrointestinal: Negative for abdominal pain, nausea and vomiting.     OBJECTIVE:  Blood pressure (!) 144/83, pulse 72, temperature 97.6 F (36.4 C), temperature source Oral, height 6\' 1"  (1.854 m), weight 175 lb (79.4 kg), SpO2 97 %. Body mass index is 23.09 kg/m.   Physical Exam Vitals signs and nursing note reviewed.  Constitutional:      Appearance: He is well-developed.  HENT:     Head: Normocephalic and atraumatic.  Eyes:     Conjunctiva/sclera: Conjunctivae normal.     Pupils: Pupils are equal, round, and reactive to light.  Neck:     Musculoskeletal: Neck supple.  Cardiovascular:     Rate and Rhythm: Normal rate and regular rhythm.     Heart sounds: No murmur. No friction rub. No gallop.   Pulmonary:     Effort: Pulmonary effort is normal.     Breath sounds: Normal breath sounds. No wheezing or rales.  Skin:    General: Skin is warm and dry.  Neurological:     Mental Status: He is alert and oriented to person, place, and time.     Results for orders placed or performed in visit on 12/15/18 (from the past 24 hour(s))  POCT glucose (manual entry)     Status: Abnormal   Collection Time: 12/15/18 11:23 AM  Result Value Ref Range   POC Glucose 328 (A) 70 - 99 mg/dl  POCT urinalysis dipstick     Status: Abnormal   Collection Time: 12/15/18 11:26 AM  Result Value Ref Range   Color, UA yellow yellow   Clarity, UA clear clear   Glucose, UA =500 (A) negative mg/dL   Bilirubin, UA negative negative   Ketones, POC UA negative negative mg/dL   Spec Grav, UA 1.015 1.010 - 1.025   Blood, UA negative negative   pH, UA 6.0 5.0 - 8.0   Protein Ur, POC negative negative mg/dL   Urobilinogen, UA 0.2 0.2 or 1.0 E.U./dL   Nitrite, UA Negative Negative   Leukocytes, UA Negative Negative    ASSESSMENT and PLAN  1.  Hyperglycemia 2. Type 2 diabetes mellitus with complication, without long-term current use of insulin (HCC) Adding januvia, reviewed r/se/b. Cont working on [employer].  - POCT glucose (manual entry) - POCT urinalysis dipstick  Other orders - sitaGLIPtin (JANUVIA) 100 MG tablet; Take 1 tablet (100 mg total) by mouth daily.  Return in about 2 weeks (around 12/29/2018).    Rutherford Guys, MD Primary Care at [place], [place] [address] Ph.  [phone] Fax [phone]

## 2018-12-15 NOTE — Patient Instructions (Addendum)
Please check sugars twice a day, fasting and 2 hours after dinner Adding januvia once a day, with breakfast Continue with metformin   Dr. Magnus Ivan [facility]  [address], [place], [place] [address]  [phone]    If you have lab work done today you will be contacted with your lab results within the next 2 weeks.  If you have not heard from [place] then please contact us. The fastest way to get your results is to register for My Chart.   IF you received an x-ray today, you will receive an invoice from [facility]. Please contact [facility] at [phone] with questions or concerns regarding your invoice.   IF you received labwork today, you will receive an invoice from [facility]. Please contact [facility] at [phone] with questions or concerns regarding your invoice.   Our billing staff will not be able to assist you with questions regarding bills from these companies.  You will be contacted with the lab results as soon as they are available. The fastest way to get your results is to activate your My Chart account. Instructions are located on the last page of this paperwork. If you have not heard from [place] regarding the results in 2 weeks, please contact this office.

## 2018-12-20 ENCOUNTER — Ambulatory Visit (INDEPENDENT_AMBULATORY_CARE_PROVIDER_SITE_OTHER): Payer: Medicare Other | Admitting: Family Medicine

## 2018-12-20 DIAGNOSIS — E1122 Type 2 diabetes mellitus with diabetic chronic kidney disease: Secondary | ICD-10-CM

## 2018-12-21 ENCOUNTER — Other Ambulatory Visit: Payer: Self-pay | Admitting: Family Medicine

## 2018-12-21 LAB — CBC
Hematocrit: 38.8 % (ref 37.5–51.0)
Hemoglobin: 12.7 g/dL — ABNORMAL LOW (ref 13.0–17.7)
MCH: 29.3 pg (ref 26.6–33.0)
MCHC: 32.7 g/dL (ref 31.5–35.7)
MCV: 90 fL (ref 79–97)
Platelets: 183 10*3/uL (ref 150–450)
RBC: 4.33 x10E6/uL (ref 4.14–5.80)
RDW: 13.8 % (ref 11.6–15.4)
WBC: 4.9 10*3/uL (ref 3.4–10.8)

## 2018-12-21 LAB — PTH, INTACT AND CALCIUM
Calcium: 10.6 mg/dL — ABNORMAL HIGH (ref 8.6–10.2)
PTH: 69 pg/mL — ABNORMAL HIGH (ref 15–65)

## 2018-12-21 LAB — PHOSPHORUS: Phosphorus: 2.2 mg/dL — ABNORMAL LOW (ref 2.8–4.1)

## 2018-12-21 LAB — VITAMIN D 25 HYDROXY (VIT D DEFICIENCY, FRACTURES): Vit D, 25-Hydroxy: 19.4 ng/mL — ABNORMAL LOW (ref 30.0–100.0)

## 2018-12-21 MED ORDER — SITAGLIPTIN PHOSPHATE 100 MG PO TABS: 100.0000 mg | ORAL_TABLET | Freq: Every day | ORAL | 0 refills | Status: DC

## 2018-12-21 NOTE — Addendum Note (Signed)
Addended by: Addison Naegeli on: 12/21/2018 02:09 PM   Modules accepted: Orders

## 2018-12-21 NOTE — Telephone Encounter (Addendum)
Received e-prescribed error; called pharmacy to verify receipt of prescription; spoke with Chanda Busing, Pharmacist; she states that they did not receive the previous electronic request; she also states that the pharmacy will not cover a 30 day supply, and it will be difficult for the pt to afford it because it is very expensive; will resend request with updated amount.   Requested Prescriptions  Pending Prescriptions Disp Refills  . sitaGLIPtin (JANUVIA) 100 MG tablet 90 tablet 0    Sig: Take 1 tablet (100 mg total) by mouth daily.     There is no refill protocol information for this order    Signed Prescriptions Disp Refills   JANUVIA 100 MG tablet 30 tablet 0    Sig: TAKE 1 TABLET BY MOUTH EVERY DAY     Endocrinology:  Diabetes - DPP-4 Inhibitors Failed - 12/21/2018 12:26 PM      Failed - HBA1C is between 0 and 7.9 and within 180 days    Hemoglobin A1C  Date Value Ref Range Status  11/26/2018 8.2 (A) 4.0 - 5.6 % Final   Hgb A1c MFr Bld  Date Value Ref Range Status  10/01/2018 7.5 (H) 4.8 - 5.6 % Final    Comment:             Prediabetes: 5.7 - 6.4          Diabetes: >6.4          Glycemic control for adults with diabetes: <7.0          Failed - Cr in normal range and within 360 days    Creat  Date Value Ref Range Status  04/25/2016 1.48 (H) 0.70 - 1.18 mg/dL Final    Comment:      For patients > or = 81 years of age: The upper reference limit for Creatinine is approximately 13% higher for people identified as African-American.      Creatinine, Ser  Date Value Ref Range Status  11/26/2018 1.28 (H) 0.76 - 1.27 mg/dL Final         Passed - Valid encounter within last 6 months    Recent Outpatient Visits          Yesterday Type 2 diabetes mellitus with chronic kidney disease, without long-term current use of insulin, unspecified CKD stage [facility])   [facility], Lilia Argue, MD   6 days ago Hyperglycemia   [facility], Lilia Argue, MD   3  weeks ago Type 2 diabetes mellitus with chronic kidney disease, without long-term current use of insulin, unspecified CKD stage [facility])   [facility] at [facility], Lilia Argue, MD   2 months ago Essential hypertension   [facility] at [facility], Ranell Patrick, MD   2 months ago Type 2 diabetes mellitus with chronic kidney disease, without long-term current use of insulin, unspecified CKD stage [facility])   [facility] at [facility], Lilia Argue, MD      Future Appointments            In 6 days Rutherford Guys, MD Primary Care at [place], [facility]   In 1 month Rutherford Guys, MD Primary Care at [place], [facility]

## 2018-12-21 NOTE — Telephone Encounter (Signed)
Refill request for sitagliptin; last refill 12/15/2018 #20; pt to follow up in office in 2 weeks per MD's request; pt has appointment scheduled 01/20/2019; will grant 30 day refill to cover pt until appointment. Requested Prescriptions  Pending Prescriptions Disp Refills  . JANUVIA 100 MG tablet [Pharmacy Med Name: JANUVIA 100 MG TABLET] 30 tablet 0    Sig: TAKE 1 TABLET BY MOUTH EVERY DAY     Endocrinology:  Diabetes - DPP-4 Inhibitors Failed - 12/21/2018 12:26 PM      Failed - HBA1C is between 0 and 7.9 and within 180 days    Hemoglobin A1C  Date Value Ref Range Status  11/26/2018 8.2 (A) 4.0 - 5.6 % Final   Hgb A1c MFr Bld  Date Value Ref Range Status  10/01/2018 7.5 (H) 4.8 - 5.6 % Final    Comment:             Prediabetes: 5.7 - 6.4          Diabetes: >6.4          Glycemic control for adults with diabetes: <7.0          Failed - Cr in normal range and within 360 days    Creat  Date Value Ref Range Status  04/25/2016 1.48 (H) 0.70 - 1.18 mg/dL Final    Comment:      For patients > or = 81 years of age: The upper reference limit for Creatinine is approximately 13% higher for people identified as African-American.      Creatinine, Ser  Date Value Ref Range Status  11/26/2018 1.28 (H) 0.76 - 1.27 mg/dL Final         Passed - Valid encounter within last 6 months    Recent Outpatient Visits          Yesterday Type 2 diabetes mellitus with chronic kidney disease, without long-term current use of insulin, unspecified CKD stage [facility])   [facility] at [facility], Lilia Argue, MD   6 days ago Hyperglycemia   [facility] at [facility], Lilia Argue, MD   3 weeks ago Type 2 diabetes mellitus with chronic kidney disease, without long-term current use of insulin, unspecified CKD stage [facility])   [facility] at [facility], Lilia Argue, MD   2 months ago Essential hypertension   [facility] at [facility], Ranell Patrick, MD   2 months ago Type 2 diabetes mellitus with chronic kidney  disease, without long-term current use of insulin, unspecified CKD stage [facility])   [facility] at [facility], Lilia Argue, MD      Future Appointments            In 6 days Rutherford Guys, MD Primary Care at [place], [facility]   In 1 month Rutherford Guys, MD Primary Care at [place], [facility]

## 2018-12-23 DIAGNOSIS — H2512 Age-related nuclear cataract, left eye: Secondary | ICD-10-CM | POA: Diagnosis not present

## 2018-12-24 ENCOUNTER — Other Ambulatory Visit: Payer: Self-pay | Admitting: Family Medicine

## 2018-12-24 MED ORDER — SITAGLIPTIN PHOSPHATE 100 MG PO TABS: 100.0000 mg | ORAL_TABLET | Freq: Every day | ORAL | 0 refills | Status: DC

## 2018-12-24 NOTE — Telephone Encounter (Signed)
Copied from [place] [phone]. Topic: Quick Communication - Rx Refill/Question >> Dec 24, 2018  8:12 AM Parke Poisson wrote: Medication: sitaGLIPtin (JANUVIA) 100 MG tablet    Preferred Pharmacy (with phone number or street name): [place], [place] [phone] (Phone) [phone] (Fax)    Agent: Please be advised that RX refills may take up to 3 business days. We ask that you follow-up with your pharmacy.

## 2018-12-24 NOTE — Telephone Encounter (Signed)
Pt changing pharmacy to [facility]

## 2018-12-27 ENCOUNTER — Encounter: Payer: Self-pay | Admitting: Family Medicine

## 2018-12-27 ENCOUNTER — Ambulatory Visit (INDEPENDENT_AMBULATORY_CARE_PROVIDER_SITE_OTHER): Payer: Medicare Other | Admitting: Family Medicine

## 2018-12-27 ENCOUNTER — Other Ambulatory Visit: Payer: Self-pay

## 2018-12-27 VITALS — BP 130/80 | HR 60 | Temp 97.5°F | Resp 16 | Ht 73.0 in | Wt 175.4 lb

## 2018-12-27 DIAGNOSIS — H538 Other visual disturbances: Secondary | ICD-10-CM

## 2018-12-27 DIAGNOSIS — E559 Vitamin D deficiency, unspecified: Secondary | ICD-10-CM

## 2018-12-27 DIAGNOSIS — E118 Type 2 diabetes mellitus with unspecified complications: Secondary | ICD-10-CM

## 2018-12-27 MED ORDER — VITAMIN D (ERGOCALCIFEROL) 1.25 MG (50000 UNIT) PO CAPS: 50000.0000 [IU] | ORAL_CAPSULE | ORAL | 0 refills | Status: DC

## 2018-12-27 MED ORDER — SITAGLIPTIN PHOSPHATE 100 MG PO TABS: 100.0000 mg | ORAL_TABLET | Freq: Every day | ORAL | 1 refills | Status: DC

## 2018-12-27 NOTE — Patient Instructions (Signed)
° ° ° °  If you have lab work done today you will be contacted with your lab results within the next 2 weeks.  If you have not heard from us then please contact us. The fastest way to get your results is to register for My Chart. ° ° °IF you received an x-ray today, you will receive an invoice from [facility]. Please contact  Radiology at [phone] with questions or concerns regarding your invoice.  ° °IF you received labwork today, you will receive an invoice from [facility]. Please contact [facility] at [phone] with questions or concerns regarding your invoice.  ° °Our billing staff will not be able to assist you with questions regarding bills from these companies. ° °You will be contacted with the lab results as soon as they are available. The fastest way to get your results is to activate your My Chart account. Instructions are located on the last page of this paperwork. If you have not heard from us regarding the results in 2 weeks, please contact this office. °  ° ° ° °

## 2018-12-27 NOTE — Progress Notes (Signed)
3/16/20208:13 AM  Theodore Frank [birth year], 83 y.o. male [id number]  Chief Complaint  Patient presents with   Hypertension    2 week f/u    HPI:   Patient is a 83 y.o. male with past medical history significant for HTN, DM2, CKD, HLP,prostate cancer who presents today forfollowup  Last OV March 2020  Added januvia - has not picked up as it needs to go thru [facility] as that way it does not cost him money  Labs done off HCTZ,  Hgb 12.9 PO4 2.2 Vit D 19.4 Ca 10.6 PTH 69  he has resumed hctz-lisinopril since then for BP control  Lab Results  Component Value Date   CREATININE 1.28 (H) 11/26/2018   BUN 22 11/26/2018   NA 139 11/26/2018   K 4.2 11/26/2018   CL 100 11/26/2018   CO2 24 11/26/2018  GFR 61  Has seen optho for dizziness, blurry vision He saw someone but not satisfied, optometrist Told his left eye is not coming into focus, specially if he is reading Told he needs cataract surgery  Fastings between 130-200s Denies any hypolgyemcia  Fall Risk  12/27/2018 12/15/2018 11/26/2018 10/18/2018 09/06/2018  Falls in the past year? 0 0 0 0 0  Number falls in past yr: 0 0 - - -  Injury with Fall? 0 0 0 - -  Follow up Falls evaluation completed - - - -     Depression screen [facility] 2/9 12/27/2018 12/15/2018 11/26/2018  Decreased Interest 0 0 0  Down, Depressed, Hopeless 0 0 0  PHQ - 2 Score 0 0 0    No Known Allergies  Prior to Admission medications   Medication Sig Start Date End Date Taking? Authorizing Provider  amLODipine (NORVASC) 5 MG tablet Take 1 tablet (5 mg total) by mouth daily. 09/06/18  Yes Wendie Agreste, MD  atorvastatin (LIPITOR) 20 MG tablet Take 1 tablet (20 mg total) by mouth daily. 09/06/18  Yes Wendie Agreste, MD  gabapentin (NEURONTIN) 100 MG capsule Take 1-2 capsules (100-200 mg total) by mouth at bedtime as needed. 10/18/18  Yes Wendie Agreste, MD  lisinopril (PRINIVIL,ZESTRIL) 20 MG tablet Take 1 tablet (20 mg total) by mouth daily.  12/07/18  Yes Rutherford Guys, MD  metFORMIN (GLUCOPHAGE) 500 MG tablet Take 1 tablet (500 mg total) by mouth 2 (two) times daily with a meal. 11/26/18  Yes Rutherford Guys, MD  sitaGLIPtin (JANUVIA) 100 MG tablet Take 1 tablet (100 mg total) by mouth daily. 12/24/18  Yes Rutherford Guys, MD  vitamin B-12 (CYANOCOBALAMIN) 1000 MCG tablet Take 1,000 mcg by mouth daily.   Yes [provider]    Past Medical History:  Diagnosis Date   Acute encephalopathy 04/23/2016   Anemia    Arthritis    Cancer ([place]) 10/14/1999   colon cancer (surgery; no chemo; no radiation)   Diabetes mellitus    Hyperlipidemia    Hypertension    Prostate cancer ([place]) 10/13/1192   s/p prostatectomy   Right inguinal hernia 02/25/2012   Shortness of breath dyspnea    with exertion    Past Surgical History:  Procedure Laterality Date   COLON SURGERY  2001   colon resection for colon cancer   HERNIA REPAIR Right    INGUINAL HERNIA REPAIR  03/05/2012   Procedure: HERNIA REPAIR INGUINAL ADULT;  Surgeon: [name], MD;  Location: [facility];  Service: General;  Laterality: Right;  Repair right inguinal hernia  with mesh   LUMBAR LAMINECTOMY/DECOMPRESSION MICRODISCECTOMY Left 11/06/2014   Procedure: LEFT LUMBAR THREE-FOUR LAMINECTOMY/DECOMPRESSION MICRODISCECTOMY 1 LEVEL;  Surgeon: [name], MD;  Location: [place] [facility];  Service: Neurosurgery;  Laterality: Left;   PROSTATE SURGERY     prostatectomy for prostate cancer    Social History   Tobacco Use   Smoking status: Former Smoker    Last attempt to quit: 10/13/1965    Years since quitting: 53.2   Smokeless tobacco: Never Used  Substance Use Topics   Alcohol use: No    Family History  Problem Relation Age of Onset   Hypertension Mother    Thyroid disease Sister    Diabetes Other     Review of Systems  Constitutional: Negative for chills and fever.  Respiratory: Negative for cough and shortness of breath.    Cardiovascular: Negative for chest pain, palpitations and leg swelling.  Gastrointestinal: Negative for abdominal pain, nausea and vomiting.   Per hpi  OBJECTIVE:  Blood pressure 130/80, pulse 60, temperature (!) 97.5 F (36.4 C), temperature source Oral, resp. rate 16, height 6\' 1"  (1.854 m), weight 175 lb 6.4 oz (79.6 kg), SpO2 93 %. Body mass index is 23.14 kg/m.   Physical Exam Vitals signs and nursing note reviewed.  Constitutional:      Appearance: He is well-developed.  HENT:     Head: Normocephalic and atraumatic.  Eyes:     Conjunctiva/sclera: Conjunctivae normal.     Pupils: Pupils are equal, round, and reactive to light.  Neck:     Musculoskeletal: Neck supple.  Pulmonary:     Effort: Pulmonary effort is normal.  Skin:    General: Skin is warm and dry.  Neurological:     Mental Status: He is alert and oriented to person, place, and time.    ASSESSMENT and PLAN  1. Blurry vision, left eye - Ambulatory referral to Ophthalmology  2. Type 2 diabetes mellitus with complication, without long-term current use of insulin (HCC) Uncontrolled. Sent januvia rx to [facility]. Referring to optho for eye eval - Ambulatory referral to Ophthalmology  3. Hypercalcemia Labs suggestive of primary hyperparathyroidism. Referring to endo - Ambulatory referral to Endocrinology  4. Vitamin D deficiency Starting high dose vitamin D. Recheck labs at next OV Other orders - Vitamin D, Ergocalciferol, (DRISDOL) 1.25 MG (50000 UT) CAPS capsule; Take 1 capsule (50,000 Units total) by mouth every 7 (seven) days. - sitaGLIPtin (JANUVIA) 100 MG tablet; Take 1 tablet (100 mg total) by mouth daily.  Return in about 3 months (around 03/29/2019) for chronic medical conditions.    Rutherford Guys, MD Primary Care at [place],  [address] Ph.  [phone] Fax [phone]

## 2018-12-29 ENCOUNTER — Other Ambulatory Visit: Payer: Self-pay

## 2018-12-29 ENCOUNTER — Encounter: Payer: Self-pay | Admitting: Internal Medicine

## 2018-12-29 ENCOUNTER — Ambulatory Visit (INDEPENDENT_AMBULATORY_CARE_PROVIDER_SITE_OTHER): Payer: Medicare Other | Admitting: Internal Medicine

## 2018-12-29 DIAGNOSIS — E559 Vitamin D deficiency, unspecified: Secondary | ICD-10-CM

## 2018-12-29 LAB — BASIC METABOLIC PANEL
BUN: 18 mg/dL (ref 6–23)
CO2: 29 mEq/L (ref 19–32)
Calcium: 11.1 mg/dL — ABNORMAL HIGH (ref 8.4–10.5)
Chloride: 107 mEq/L (ref 96–112)
Creatinine, Ser: 1.28 mg/dL (ref 0.40–1.50)
GFR: 65.31 mL/min (ref >60.00)
Glucose, Bld: 153 mg/dL — ABNORMAL HIGH (ref 70–99)
Potassium: 4.1 mEq/L (ref 3.5–5.1)
Sodium: 143 mEq/L (ref 135–145)

## 2018-12-29 LAB — ALBUMIN: Albumin: 4.1 g/dL (ref 3.5–5.2)

## 2018-12-29 LAB — VITAMIN D 25 HYDROXY (VIT D DEFICIENCY, FRACTURES): VITD: 20.06 ng/mL — ABNORMAL LOW (ref 30.00–100.00)

## 2018-12-29 NOTE — Patient Instructions (Signed)
-   Please stay hydrated  - AVOID over the counter calcium, and avoid low calcium in the diet - Take Vitamin D as prescribed - Consume 2-3 servings of dairy a day (milk, cheese, yogurt, eggs)

## 2018-12-29 NOTE — Progress Notes (Signed)
Name: Theodore Frank  MRN/ DOB: [id number], [birth year]    Age/ Sex: 83 y.o., male    PCP: Rutherford Guys, MD   Reason for Endocrinology Evaluation: Hypercalcemia     Date of Initial Endocrinology Evaluation: 12/29/2018     HPI: Theodore Frank is a 83 y.o. male with a past medical history of HTN, Hx of colon and prostate cancer. The patient presented for initial endocrinology clinic visit on 12/29/2018 for consultative assistance with his hypercalcemia.   Theodore Frank indicates that he was first diagnosed with hypercalcemia in 2019, during routine work up. In review of his records patient noted with hypercalcemia since 2013. Since that time, he has not experienced symptoms of constipation, but has polyuria, polydipsia, he denies generalized weakness, diffuse muscle pains, significant memory impairment. He denies use of over the counter calcium (including supplements, Tums, Rolaids, or other calcium containing antacids) or lithium.  He has been off HCTZ since February, 2020  He is not on vitamin D but there's a prescription for 50,000 iu weekly sent by his PCP.   He denies history of kidney stones, kidney disease, liver disease, granulomatous disease. He denies osteoporosis or prior fractures. Daily dietary calcium intake: 1 servings . He denies family history of osteoporosis, parathyroid disease, questionable  thyroid disease in his sister.     HISTORY:  Past Medical History:  Past Medical History:  Diagnosis Date  . Acute encephalopathy 04/23/2016  . Anemia   . Arthritis   . Cancer ([place]) 10/14/1999   colon cancer (surgery; no chemo; no radiation)  . Diabetes mellitus   . Hyperlipidemia   . Hypertension   . Prostate cancer ([place]) 10/13/1192   s/p prostatectomy  . Right inguinal hernia 02/25/2012  . Shortness of breath dyspnea    with exertion   Past Surgical History:  Past Surgical History:  Procedure Laterality Date  . COLON SURGERY  2001   colon resection for colon cancer   . HERNIA REPAIR Right   . INGUINAL HERNIA REPAIR  03/05/2012   Procedure: HERNIA REPAIR INGUINAL ADULT;  Surgeon: [name], MD;  Location: [place];  Service: General;  Laterality: Right;  Repair right inguinal hernia with mesh  . LUMBAR LAMINECTOMY/DECOMPRESSION MICRODISCECTOMY Left 11/06/2014   Procedure: LEFT LUMBAR THREE-FOUR LAMINECTOMY/DECOMPRESSION MICRODISCECTOMY 1 LEVEL;  Surgeon: [name], MD;  Location: [place] [facility];  Service: Neurosurgery;  Laterality: Left;  . PROSTATE SURGERY     prostatectomy for prostate cancer      Social History:  reports that he quit smoking about 53 years ago. He has never used smokeless tobacco. He reports that he does not drink alcohol or use drugs.  Family History: family history includes Diabetes in an other family member; Hypertension in his mother; Thyroid disease in his sister.   HOME MEDICATIONS: Allergies as of 12/29/2018   No Known Allergies     Medication List       Accurate as of December 29, 2018  8:06 AM. Always use your most recent med list.        amLODipine 5 MG tablet Commonly known as:  NORVASC Take 1 tablet (5 mg total) by mouth daily.   atorvastatin 20 MG tablet Commonly known as:  LIPITOR Take 1 tablet (20 mg total) by mouth daily.   gabapentin 100 MG capsule Commonly known as:  NEURONTIN Take 1-2 capsules (100-200 mg total) by mouth at bedtime as needed.   lisinopril 20 MG tablet Commonly known  as:  PRINIVIL,ZESTRIL Take 1 tablet (20 mg total) by mouth daily.   metFORMIN 500 MG tablet Commonly known as:  GLUCOPHAGE Take 1 tablet (500 mg total) by mouth 2 (two) times daily with a meal.   sitaGLIPtin 100 MG tablet Commonly known as:  Januvia Take 1 tablet (100 mg total) by mouth daily.   vitamin B-12 1000 MCG tablet Commonly known as:  CYANOCOBALAMIN Take 1,000 mcg by mouth daily.   Vitamin D (Ergocalciferol) 1.25 MG (50000 UT) Caps capsule Commonly known as:  DRISDOL Take 1  capsule (50,000 Units total) by mouth every 7 (seven) days.         REVIEW OF SYSTEMS: A comprehensive ROS was conducted with the patient and is negative except as per HPI and below:  Review of Systems  Constitutional: Negative for fever and malaise/fatigue.  HENT: Negative for congestion and sore throat.   Eyes: Positive for blurred vision. Negative for pain.  Respiratory: Negative for cough and shortness of breath.   Cardiovascular: Negative for chest pain and palpitations.  Gastrointestinal: Negative for constipation and nausea.  Genitourinary: Positive for frequency.  Skin: Negative.   Neurological: Positive for tingling. Negative for tremors.  Endo/Heme/Allergies: Positive for polydipsia.  Psychiatric/Behavioral: Negative for depression. The patient is not nervous/anxious.        OBJECTIVE:  VS: BP 130/68 (BP Location: Left Arm, Patient Position: Sitting, Cuff Size: Normal)   Pulse (!) 59   Temp (!) 97.5 F (36.4 C) (Oral)   Ht 6\' 1"  (1.854 m)   Wt 179 lb (81.2 kg)   SpO2 97%   BMI 23.62 kg/m    Wt Readings from Last 3 Encounters:  12/29/18 179 lb (81.2 kg)  12/27/18 175 lb 6.4 oz (79.6 kg)  12/15/18 175 lb (79.4 kg)     EXAM: General: Pt appears well and is in NAD  Hydration: Well-hydrated with moist mucous membranes and good skin turgor  Eyes: External eye exam normal without stare, lid lag or exophthalmos.  EOM intact.    Neck: General: Supple without adenopathy. Thyroid: Thyroid size normal.  No goiter or nodules appreciated. No thyroid bruit.  Lungs: Clear with good BS bilat with no rales, rhonchi, or wheezes  Heart: Auscultation: RRR.  Abdomen: Normoactive bowel sounds, soft, nontender, without masses or organomegaly palpable  Extremities:  BL LE: No pretibial edema normal ROM and strength.  Skin: Hair: Texture and amount normal with gender appropriate distribution Skin Inspection: No rashes. Skin Palpation: Skin temperature, texture, and thickness  normal to palpation  Neuro: Cranial nerves: II - XII grossly intact  Motor: Normal strength throughout DTRs: 2+ and symmetric in UE without delay in relaxation phase  Mental Status: Judgment, insight: Intact Orientation: Oriented to time, place, and person Mood and affect: No depression, anxiety, or agitation     DATA REVIEWED:  Results for LEBERT, LOVERN (MRN [id number]) as of 12/30/2018 08:16  Ref. Range 12/29/2018 08:36  Sodium Latest Ref Range: 135 - 145 mEq/L 143  Potassium Latest Ref Range: 3.5 - 5.1 mEq/L 4.1  Chloride Latest Ref Range: 96 - 112 mEq/L 107  CO2 Latest Ref Range: 19 - 32 mEq/L 29  Glucose Latest Ref Range: 70 - 99 mg/dL 153 (H)  BUN Latest Ref Range: 6 - 23 mg/dL 18  Creatinine Latest Ref Range: 0.40 - 1.50 mg/dL 1.28  Calcium Latest Ref Range: 8.4 - 10.5 mg/dL 11.1 (H)  Albumin Latest Ref Range: 3.5 - 5.2 g/dL 4.1  GFR Latest Ref Range: >60.00  mL/min 65.31  VITD Latest Ref Range: 30.00 - 100.00 ng/mL 20.06 (L)    ASSESSMENT/PLAN/RECOMMENDATIONS:   1. Hypercalcemia:  - Pt is asymptomatic  - Differential diagnosis includes Familial Hypocalciuric hypercalcemia ([place]) vs Primary hyperparathyroidism (pHPT) - I explain to him the consequences of hypercalcemia in deteriorating kidney function and causing osteoporosis if related to pHPT.  - I also explained to him the importance of avoiding all OTC calcium, including multivitamins and compliance with vitamin D intake prior to proceeding with 24-hr urine collection for calcium creatinine ratio. To assure accurate diagnosis  Through urine testing, he needs to be vitamin D replenished first.  - Encouraged hydration - Agree with PCP in stopping HCTZ and continuing with just lisinopril, the effects of HCTZ may take up to 2 weeks.  - Pt encouraged to maintain proper dietary calcium intake 2-3 servings daily.  - Corrected calcium on today's serum is 11.02 mg/dL , vitamin D continues to be low. GFR is normal.    F/u in 3  months      Signed electronically by: Mack Guise, MD  [facility]  [address] [facility] [address] [place], [place] [address] Phone: [phone] FAX: [phone]   CC: Rutherford Guys, MD [address] [place] [address] Phone: [phone] Fax: [phone]   Return to Endocrinology clinic as below: Future Appointments  Date Time Provider [place]  01/20/2019  8:40 AM Rutherford Guys, MD PCP-PCP [facility]

## 2018-12-30 ENCOUNTER — Encounter: Payer: Self-pay | Admitting: Internal Medicine

## 2018-12-30 LAB — PTH, INTACT AND CALCIUM
Calcium: 10.6 mg/dL — ABNORMAL HIGH (ref 8.6–10.3)
PTH: 43 pg/mL (ref 14–64)

## 2019-01-14 DIAGNOSIS — D485 Neoplasm of uncertain behavior of skin: Secondary | ICD-10-CM | POA: Diagnosis not present

## 2019-01-14 DIAGNOSIS — C44319 Basal cell carcinoma of skin of other parts of face: Secondary | ICD-10-CM | POA: Diagnosis not present

## 2019-01-19 ENCOUNTER — Ambulatory Visit: Payer: Medicare Other | Admitting: Family Medicine

## 2019-01-19 ENCOUNTER — Other Ambulatory Visit: Payer: Self-pay | Admitting: Family Medicine

## 2019-01-19 DIAGNOSIS — I10 Essential (primary) hypertension: Secondary | ICD-10-CM

## 2019-01-19 DIAGNOSIS — E7800 Pure hypercholesterolemia, unspecified: Secondary | ICD-10-CM

## 2019-01-20 ENCOUNTER — Other Ambulatory Visit: Payer: Self-pay

## 2019-01-20 ENCOUNTER — Telehealth (INDEPENDENT_AMBULATORY_CARE_PROVIDER_SITE_OTHER): Payer: Medicare Other | Admitting: Family Medicine

## 2019-01-20 DIAGNOSIS — E559 Vitamin D deficiency, unspecified: Secondary | ICD-10-CM

## 2019-01-20 DIAGNOSIS — E1142 Type 2 diabetes mellitus with diabetic polyneuropathy: Secondary | ICD-10-CM

## 2019-01-20 DIAGNOSIS — I10 Essential (primary) hypertension: Secondary | ICD-10-CM

## 2019-01-20 DIAGNOSIS — E7800 Pure hypercholesterolemia, unspecified: Secondary | ICD-10-CM

## 2019-01-20 DIAGNOSIS — E1122 Type 2 diabetes mellitus with diabetic chronic kidney disease: Secondary | ICD-10-CM

## 2019-01-20 MED ORDER — METFORMIN HCL 500 MG PO TABS: 500.0000 mg | ORAL_TABLET | Freq: Two times a day (BID) | ORAL | 0 refills | Status: DC

## 2019-01-20 MED ORDER — VITAMIN B-12 1000 MCG PO TABS: 1000.0000 ug | ORAL_TABLET | Freq: Every day | ORAL | 1 refills | Status: AC

## 2019-01-20 MED ORDER — VITAMIN D (ERGOCALCIFEROL) 1.25 MG (50000 UNIT) PO CAPS: 50000.0000 [IU] | ORAL_CAPSULE | ORAL | 0 refills | Status: DC

## 2019-01-20 MED ORDER — LISINOPRIL 20 MG PO TABS: 20.0000 mg | ORAL_TABLET | Freq: Every day | ORAL | 1 refills | Status: DC

## 2019-01-20 MED ORDER — SITAGLIPTIN PHOSPHATE 100 MG PO TABS: 100.0000 mg | ORAL_TABLET | Freq: Every day | ORAL | 1 refills | Status: DC

## 2019-01-20 MED ORDER — GABAPENTIN 100 MG PO CAPS: 100.0000 mg | ORAL_CAPSULE | Freq: Every evening | ORAL | 0 refills | Status: DC | PRN

## 2019-01-20 MED ORDER — ATORVASTATIN CALCIUM 20 MG PO TABS: 20.0000 mg | ORAL_TABLET | Freq: Every day | ORAL | 1 refills | Status: DC

## 2019-01-20 MED ORDER — AMLODIPINE BESYLATE 5 MG PO TABS: 5.0000 mg | ORAL_TABLET | Freq: Every day | ORAL | 1 refills | Status: DC

## 2019-01-20 NOTE — Progress Notes (Signed)
Virtual Visit via telephone Note  I connected with patient on 01/20/19 at 900am by telephone and verified that I am speaking with the correct person using two identifiers. Theodore Frank is currently located at home and patient is currently with her during visit. The provider, Rutherford Guys, MD is located in their office at time of visit.  I discussed the limitations, risks, security and privacy concerns of performing an evaluation and management service by telephone and the availability of in person appointments. I also discussed with the patient that there may be a patient responsible charge related to this service. The patient expressed understanding and agreed to proceed.  Telephone visit today for routine followup  HPI Patient is a 83 y.o. male with past medical history significant for HTN, DM2, CKD, HLP,prostate cancerwho presents today forfollowup  Last OV march 2020 Taking januvia, affordable, thru [facility] Has had mole removed, pending bx results Has seen endo for hypercalcemia, see again  On high vitamin D - taking as prescribed Overall doing well Has no acute concerns today  Lab Results  Component Value Date   HGBA1C 8.2 (A) 11/26/2018   HGBA1C 7.5 (H) 10/01/2018   HGBA1C CANCELED 09/06/2018   Lab Results  Component Value Date   MICROALBUR 0.72 03/21/2014   LDLCALC 96 09/06/2018   CREATININE 1.28 12/29/2018   BP Readings from Last 3 Encounters:  12/29/18 130/68  12/27/18 130/80  12/15/18 130/82    Fall Risk  01/20/2019 12/27/2018 12/15/2018 11/26/2018 10/18/2018  Falls in the past year? 0 0 0 0 0  Number falls in past yr: 0 0 0 - -  Injury with Fall? 0 0 0 0 -  Follow up Falls evaluation completed Falls evaluation completed - - -     Depression screen [facility] 2/9 01/20/2019 12/27/2018 12/15/2018  Decreased Interest 0 0 0  Down, Depressed, Hopeless 0 0 0  PHQ - 2 Score 0 0 0    No Known Allergies  Prior to Admission medications   Medication Sig Start Date End Date  Taking? Authorizing Provider  amLODipine (NORVASC) 5 MG tablet Take 1 tablet (5 mg total) by mouth daily. 01/20/19  Yes Rutherford Guys, MD  atorvastatin (LIPITOR) 20 MG tablet Take 1 tablet (20 mg total) by mouth daily. 01/20/19  Yes Rutherford Guys, MD  gabapentin (NEURONTIN) 100 MG capsule Take 1-2 capsules (100-200 mg total) by mouth at bedtime as needed. 01/20/19  Yes Rutherford Guys, MD  lisinopril (PRINIVIL,ZESTRIL) 20 MG tablet Take 1 tablet (20 mg total) by mouth daily. 01/20/19  Yes Rutherford Guys, MD  metFORMIN (GLUCOPHAGE) 500 MG tablet Take 1 tablet (500 mg total) by mouth 2 (two) times daily with a meal. 01/20/19  Yes Rutherford Guys, MD  sitaGLIPtin (JANUVIA) 100 MG tablet Take 1 tablet (100 mg total) by mouth daily. 01/20/19  Yes Rutherford Guys, MD  vitamin B-12 (CYANOCOBALAMIN) 1000 MCG tablet Take 1 tablet (1,000 mcg total) by mouth daily. 01/20/19  Yes Rutherford Guys, MD  Vitamin D, Ergocalciferol, (DRISDOL) 1.25 MG (50000 UT) CAPS capsule Take 1 capsule (50,000 Units total) by mouth every 7 (seven) days. 01/20/19  Yes Rutherford Guys, MD    Past Medical History:  Diagnosis Date  . Acute encephalopathy 04/23/2016  . Anemia   . Arthritis   . Cancer ([place]) 10/14/1999   colon cancer (surgery; no chemo; no radiation)  . Diabetes mellitus   . Hyperlipidemia   . Hypertension   .  Prostate cancer ([place]) 10/13/1192   s/p prostatectomy  . Right inguinal hernia 02/25/2012  . Shortness of breath dyspnea    with exertion    Past Surgical History:  Procedure Laterality Date  . COLON SURGERY  2001   colon resection for colon cancer  . HERNIA REPAIR Right   . INGUINAL HERNIA REPAIR  03/05/2012   Procedure: HERNIA REPAIR INGUINAL ADULT;  Surgeon: [name], MD;  Location: [facility];  Service: General;  Laterality: Right;  Repair right inguinal hernia with mesh  . LUMBAR LAMINECTOMY/DECOMPRESSION MICRODISCECTOMY Left 11/06/2014   Procedure: LEFT LUMBAR THREE-FOUR  LAMINECTOMY/DECOMPRESSION MICRODISCECTOMY 1 LEVEL;  Surgeon: [name], MD;  Location: [facility];  Service: Neurosurgery;  Laterality: Left;  . PROSTATE SURGERY     prostatectomy for prostate cancer    Social History   Tobacco Use  . Smoking status: Former Smoker    Last attempt to quit: 10/13/1965    Years since quitting: 53.3  . Smokeless tobacco: Never Used  Substance Use Topics  . Alcohol use: No    Family History  Problem Relation Age of Onset  . Hypertension Mother   . Thyroid disease Sister   . Diabetes Other     ROS Per hpi  Objective  Vitals as reported by the patient: none  There were no vitals filed for this visit.  ASSESSMENT and PLAN  1. Essential hypertension Controlled. Continue current regime.  - amLODipine (NORVASC) 5 MG tablet; Take 1 tablet (5 mg total) by mouth daily. - Comprehensive metabolic panel; Future  2. Pure hypercholesterolemia Controlled. Continue current regime.  - atorvastatin (LIPITOR) 20 MG tablet; Take 1 tablet (20 mg total) by mouth daily.  3. Diabetic polyneuropathy associated with type 2 diabetes mellitus ([place]) Controlled. Continue current regime.  - gabapentin (NEURONTIN) 100 MG capsule; Take 1-2 capsules (100-200 mg total) by mouth at bedtime as needed.  4. Type 2 diabetes mellitus with chronic kidney disease, without long-term current use of insulin, unspecified CKD stage ([place]) Continue with metformin and januvia. Cont with LFM. Recheck labs prior to next visit - metFORMIN (GLUCOPHAGE) 500 MG tablet; Take 1 tablet (500 mg total) by mouth 2 (two) times daily with a meal. - Hemoglobin A1c; Future  5. Vitamin D deficiency Cont with high dose vitamin d. Recheck labs prior to next visit - Vitamin D, 25-hydroxy; Future  6. Hypercalcemia Managed by endo  Other orders - lisinopril (PRINIVIL,ZESTRIL) 20 MG tablet; Take 1 tablet (20 mg total) by mouth daily. - sitaGLIPtin (JANUVIA) 100 MG tablet; Take 1 tablet (100 mg  total) by mouth daily. - vitamin B-12 (CYANOCOBALAMIN) 1000 MCG tablet; Take 1 tablet (1,000 mcg total) by mouth daily. - Vitamin D, Ergocalciferol, (DRISDOL) 1.25 MG (50000 UT) CAPS capsule; Take 1 capsule (50,000 Units total) by mouth every 7 (seven) days.  FOLLOW-UP: 3 months with labs 3-4 days prior   The above assessment and management plan was discussed with the patient. The patient verbalized understanding of and has agreed to the management plan. Patient is aware to call the clinic if symptoms persist or worsen. Patient is aware when to return to the clinic for a follow-up visit. Patient educated on when it is appropriate to go to the emergency department.    I provided 11 minutes of non-face-to-face time during this encounter.  Rutherford Guys, MD [facility] [place], [place] [address] Ph.  [phone] Fax [phone]

## 2019-01-20 NOTE — Progress Notes (Signed)
Chief Complaint: Med refill  Pt states that is not out of medication at this time. Pt has no other concerns.

## 2019-02-02 ENCOUNTER — Ambulatory Visit (INDEPENDENT_AMBULATORY_CARE_PROVIDER_SITE_OTHER): Payer: Medicare Other | Admitting: Family Medicine

## 2019-02-02 ENCOUNTER — Other Ambulatory Visit: Payer: Self-pay

## 2019-02-02 VITALS — BP 139/78 | Ht 73.0 in | Wt 179.0 lb

## 2019-02-02 DIAGNOSIS — Z0000 Encounter for general adult medical examination without abnormal findings: Secondary | ICD-10-CM | POA: Diagnosis not present

## 2019-02-02 NOTE — Patient Instructions (Signed)
Thank you for taking time to come for your Medicare Wellness Visit. I appreciate your ongoing commitment to your health goals. Please review the following plan we discussed and let me know if I can assist you in the future.  Theodore Kennedy LPN          Healthy Eating Following a healthy eating pattern may help you to achieve and maintain a healthy body weight, reduce the risk of chronic disease, and live a long and productive life. It is important to follow a healthy eating pattern at an appropriate calorie level for your body. Your nutritional needs should be met primarily through food by choosing a variety of nutrient-rich foods. What are tips for following this plan? Reading food labels  Read labels and choose the following: ? Reduced or low sodium. ? Juices with 100% fruit juice. ? Foods with low saturated fats and high polyunsaturated and monounsaturated fats. ? Foods with whole grains, such as whole wheat, cracked wheat, brown rice, and wild rice. ? Whole grains that are fortified with folic acid. This is recommended for women who are pregnant or who want to become pregnant.  Read labels and avoid the following: ? Foods with a lot of added sugars. These include foods that contain brown sugar, corn sweetener, corn syrup, dextrose, fructose, glucose, high-fructose corn syrup, honey, invert sugar, lactose, malt syrup, maltose, molasses, raw sugar, sucrose, trehalose, or turbinado sugar.  Do not eat more than the following amounts of added sugar per day:  6 teaspoons (25 g) for women.  9 teaspoons (38 g) for men. ? Foods that contain processed or refined starches and grains. ? Refined grain products, such as white flour, degermed cornmeal, white bread, and white rice. Shopping  Choose nutrient-rich snacks, such as vegetables, whole fruits, and nuts. Avoid high-calorie and high-sugar snacks, such as potato chips, fruit snacks, and candy.  Use oil-based dressings and  spreads on foods instead of solid fats such as butter, stick margarine, or cream cheese.  Limit pre-made sauces, mixes, and "instant" products such as flavored rice, instant noodles, and ready-made pasta.  Try more plant-protein sources, such as tofu, tempeh, black beans, edamame, lentils, nuts, and seeds.  Explore eating plans such as the Mediterranean diet or vegetarian diet. Cooking  Use oil to saut or stir-fry foods instead of solid fats such as butter, stick margarine, or lard.  Try baking, boiling, grilling, or broiling instead of frying.  Remove the fatty part of meats before cooking.  Steam vegetables in water or broth. Meal planning   At meals, imagine dividing your plate into fourths: ? One-half of your plate is fruits and vegetables. ? One-fourth of your plate is whole grains. ? One-fourth of your plate is protein, especially lean meats, poultry, eggs, tofu, beans, or nuts.  Include low-fat dairy as part of your daily diet. Lifestyle  Choose healthy options in all settings, including home, work, school, restaurants, or stores.  Prepare your food safely: ? Wash your hands after handling raw meats. ? Keep food preparation surfaces clean by regularly washing with hot, soapy water. ? Keep raw meats separate from ready-to-eat foods, such as fruits and vegetables. ? Cook seafood, meat, poultry, and eggs to the recommended internal temperature. ? Store foods at safe temperatures. In general:  Keep cold foods at 51F (4.4C) or below.  Keep hot foods at 151F (60C) or above.  Keep your freezer at [facility] (-17.8C) or below.  Foods are no longer safe to  eat when they have been between the temperatures of 40-140F (4.4-60C) for more than 2 hours. What foods should I eat? Fruits Aim to eat 2 cup-equivalents of fresh, canned (in natural juice), or frozen fruits each day. Examples of 1 cup-equivalent of fruit include 1 small apple, 8 large strawberries, 1 cup canned fruit,   cup dried fruit, or 1 cup 100% juice. Vegetables Aim to eat 2-3 cup-equivalents of fresh and frozen vegetables each day, including different varieties and colors. Examples of 1 cup-equivalent of vegetables include 2 medium carrots, 2 cups raw, leafy greens, 1 cup chopped vegetable (raw or cooked), or 1 medium baked potato. Grains Aim to eat 6 ounce-equivalents of whole grains each day. Examples of 1 ounce-equivalent of grains include 1 slice of bread, 1 cup ready-to-eat cereal, 3 cups popcorn, or  cup cooked rice, pasta, or cereal. Meats and other proteins Aim to eat 5-6 ounce-equivalents of protein each day. Examples of 1 ounce-equivalent of protein include 1 egg, 1/2 cup nuts or seeds, or 1 tablespoon (16 g) peanut butter. A cut of meat or fish that is the size of a deck of cards is about 3-4 ounce-equivalents.  Of the protein you eat each week, try to have at least 8 ounces come from seafood. This includes salmon, trout, herring, and anchovies. Dairy Aim to eat 3 cup-equivalents of fat-free or low-fat dairy each day. Examples of 1 cup-equivalent of dairy include 1 cup (240 mL) milk, 8 ounces (250 g) yogurt, 1 ounces (44 g) natural cheese, or 1 cup (240 mL) fortified soy milk. Fats and oils  Aim for about 5 teaspoons (21 g) per day. Choose monounsaturated fats, such as canola and olive oils, avocados, peanut butter, and most nuts, or polyunsaturated fats, such as sunflower, corn, and soybean oils, walnuts, pine nuts, sesame seeds, sunflower seeds, and flaxseed. Beverages  Aim for six 8-oz glasses of water per day. Limit coffee to three to five 8-oz cups per day.  Limit caffeinated beverages that have added calories, such as soda and energy drinks.  Limit alcohol intake to no more than 1 drink a day for nonpregnant women and 2 drinks a day for men. One drink equals 12 oz of beer (355 mL), 5 oz of wine (148 mL), or 1 oz of hard liquor (44 mL). Seasoning and other foods  Avoid adding  excess amounts of salt to your foods. Try flavoring foods with herbs and spices instead of salt.  Avoid adding sugar to foods.  Try using oil-based dressings, sauces, and spreads instead of solid fats. This information is based on general [place] nutrition guidelines. For more information, visit [employer]. Exact amounts may vary based on your nutrition needs. Summary  A healthy eating plan may help you to maintain a healthy weight, reduce the risk of chronic diseases, and stay active throughout your life.  Plan your meals. Make sure you eat the right portions of a variety of nutrient-rich foods.  Try baking, boiling, grilling, or broiling instead of frying.  Choose healthy options in all settings, including home, work, school, restaurants, or stores. This information is not intended to replace advice given to you by your health care provider. Make sure you discuss any questions you have with your health care provider. Document Released: 01/11/2018 Document Revised: 01/11/2018 Document Reviewed: 01/11/2018 Elsevier Interactive Patient Education  2019 [place] Directive  Advance directives are legal documents that let you make choices ahead of time about your health care and medical treatment in case  you become unable to communicate for yourself. Advance directives are a way for you to communicate your wishes to family, friends, and health care providers. This can help convey your decisions about end-of-life care if you become unable to communicate. Discussing and writing advance directives should happen over time rather than all at once. Advance directives can be changed depending on your situation and what you want, even after you have signed the advance directives. If you do not have an advance directive, some states assign family decision makers to act on your behalf based on how closely you are related to them. Each state has its own laws regarding advance directives. You  may want to check with your health care provider, attorney, or state representative about the laws in your state. There are different types of advance directives, such as:  Medical power of attorney.  Living will.  Do not resuscitate (DNR) or do not attempt resuscitation (DNAR) order. Health care proxy and medical power of attorney A health care proxy, also called a health care agent, is a person who is appointed to make medical decisions for you in cases in which you are unable to make the decisions yourself. Generally, people choose someone they know well and trust to represent their preferences. Make sure to ask this person for an agreement to act as your proxy. A proxy may have to exercise judgment in the event of a medical decision for which your wishes are not known. A medical power of attorney is a legal document that names your health care proxy. Depending on the laws in your state, after the document is written, it may also need to be:  Signed.  Notarized.  Dated.  Copied.  Witnessed.  Incorporated into your medical record. You may also want to appoint someone to manage your financial affairs in a situation in which you are unable to do so. This is called a durable power of attorney for finances. It is a separate legal document from the durable power of attorney for health care. You may choose the same person or someone different from your health care proxy to act as your agent in financial matters. If you do not appoint a proxy, or if there is a concern that the proxy is not acting in your best interests, a court-appointed guardian may be designated to act on your behalf. Living will A living will is a set of instructions documenting your wishes about medical care when you cannot express them yourself. Health care providers should keep a copy of your living will in your medical record. You may want to give a copy to family members or friends. To alert caregivers in case of an  emergency, you can place a card in your wallet to let them know that you have a living will and where they can find it. A living will is used if you become:  Terminally ill.  Incapacitated.  Unable to communicate or make decisions. Items to consider in your living will include:  The use or non-use of life-sustaining equipment, such as dialysis machines and breathing machines (ventilators).  A DNR or DNAR order, which is the instruction not to use cardiopulmonary resuscitation (CPR) if breathing or heartbeat stops.  The use or non-use of tube feeding.  Withholding of food and fluids.  Comfort (palliative) care when the goal becomes comfort rather than a cure.  Organ and tissue donation. A living will does not give instructions for distributing your money and property if you should  pass away. It is recommended that you seek the advice of a lawyer when writing a will. Decisions about taxes, beneficiaries, and asset distribution will be legally binding. This process can relieve your family and friends of any concerns surrounding disputes or questions that may come up about the distribution of your assets. DNR or DNAR A DNR or DNAR order is a request not to have CPR in the event that your heart stops beating or you stop breathing. If a DNR or DNAR order has not been made and shared, a health care provider will try to help any patient whose heart has stopped or who has stopped breathing. If you plan to have surgery, talk with your health care provider about how your DNR or DNAR order will be followed if problems occur. Summary  Advance directives are the legal documents that allow you to make choices ahead of time about your health care and medical treatment in case you become unable to communicate for yourself.  The process of discussing and writing advance directives should happen over time. You can change the advance directives, even after you have signed them.  Advance directives include  DNR or DNAR orders, living wills, and designating an agent as your medical power of attorney. This information is not intended to replace advice given to you by your health care provider. Make sure you discuss any questions you have with your health care provider. Document Released: 01/06/2008 Document Revised: 08/18/2016 Document Reviewed: 08/18/2016 Elsevier Interactive Patient Education  2019 [employer].

## 2019-02-02 NOTE — Progress Notes (Signed)
Presents today for [employer] Visit   Date of last exam: 01-20-2019  Interpreter used for this visit? No    Patient Care Team: Rutherford Guys, MD as PCP - General (Family Medicine) [name], MD as Consulting Physician (Gastroenterology)   Other items to address today:   Other Screening: Last screening for diabetes: 11/26/2018 Last lipid screening: 09/06/2018  ADVANCE DIRECTIVES: Discussed: yes  On File: NO Materials Provided: YES (MAILED)  Immunization status:  Immunization History  Administered Date(s) Administered  . Influenza, High Dose Seasonal PF 09/06/2018  . Influenza,inj,Quad PF,6+ Mos 06/27/2015, 06/13/2016, 08/06/2017  . Pneumococcal Conjugate-13 11/13/2016  . Pneumococcal Polysaccharide-23 11/17/2017     There are no preventive care reminders to display for this patient.   Functional Status Survey: Is the patient deaf or have difficulty hearing?: No Does the patient have difficulty seeing, even when wearing glasses/contacts?: No Does the patient have difficulty concentrating, remembering, or making decisions?: No Does the patient have difficulty walking or climbing stairs?: No Does the patient have difficulty dressing or bathing?: No Does the patient have difficulty doing errands alone such as visiting a doctor's office or shopping?: No   6CIT Screen 02/02/2019 11/17/2017  What Year? 0 points 0 points  What month? 0 points 0 points  What time? 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   Patient Active Problem List   Diagnosis Date Noted  . Hypercalcemia 12/29/2018  . Vitamin D  deficiency 12/27/2018  . COPD (chronic obstructive pulmonary disease) ([place]) 02/23/2018  . Dilation of thoracic aorta ([place]) 02/23/2018  . History of colon cancer 11/13/2016  . Anemia 05/06/2016  . Arthritis 05/06/2016  . Hypoglycemia 04/23/2016  . Chronic kidney disease, stage 3 ([place]) 04/23/2016  . HNP (herniated nucleus pulposus), lumbar 11/06/2014  . History of prostate cancer 04/05/2013  . S/P right inguinal hernia repair 04/02/2012  . Hypertension 01/30/2012  . Hyperthyroidism 01/30/2012  . Type 2 diabetes mellitus with complication, without long-term current use of insulin ([place]) 01/30/2012  . Hyperlipidemia 01/30/2012     Past Medical History:  Diagnosis Date  . Acute encephalopathy 04/23/2016  . Anemia   . Arthritis   . Cancer ([place]) 10/14/1999   colon cancer (surgery; no chemo; no radiation)  . Diabetes mellitus   . Hyperlipidemia   . Hypertension   . Prostate cancer ([place]) 10/13/1192   s/p prostatectomy  . Right inguinal hernia 02/25/2012  . Shortness of breath dyspnea    with exertion     Past Surgical History:  Procedure Laterality Date  . COLON SURGERY  2001   colon resection for colon cancer  . HERNIA REPAIR Right   . INGUINAL HERNIA REPAIR  03/05/2012   Procedure: HERNIA REPAIR INGUINAL ADULT;  Surgeon: [name], MD;  Location: [place];  Service: General;  Laterality: Right;  Repair right inguinal hernia with mesh  . LUMBAR LAMINECTOMY/DECOMPRESSION MICRODISCECTOMY Left 11/06/2014   Procedure: LEFT LUMBAR THREE-FOUR LAMINECTOMY/DECOMPRESSION MICRODISCECTOMY 1 LEVEL;  Surgeon: [name],  MD;  Location: [place] [facility];  Service: Neurosurgery;  Laterality: Left;  . PROSTATE SURGERY     prostatectomy for prostate cancer     Family History  Problem Relation Age of Onset  . Hypertension Mother   . Thyroid disease Sister   . Diabetes Other      Social History   Socioeconomic History  . Marital status: Divorced    Spouse name: Not on  file  . Number of children: 4  . Years of education: 4  . Highest education level: Not on file  Occupational History  . Occupation: Training and development officer  Social Needs  . Financial resource strain: Not hard at all  . Food insecurity:    Worry: Never true    Inability: Never true  . Transportation needs:    Medical: No    Non-medical: No  Tobacco Use  . Smoking status: Former Smoker    Last attempt to quit: 10/13/1965    Years since quitting: 53.3  . Smokeless tobacco: Never Used  Substance and Sexual Activity  . Alcohol use: No  . Drug use: No  . Sexual activity: Yes    Birth control/protection: None  Lifestyle  . Physical activity:    Days per week: 0 days    Minutes per session: 0 min  . Stress: Not at all  Relationships  . Social connections:    Talks on phone: Never    Gets together: Never    Attends religious service: More than 4 times per year    Active member of club or organization: No    Attends meetings of clubs or organizations: Never    Relationship status: Divorced  . Intimate partner violence:    Fear of current or ex partner: No    Emotionally abused: No    Physically abused: No    Forced sexual activity: No  Other Topics Concern  . Not on file  Social History Narrative   Marital status: divorced; single in 2018; not dating      Children: 4 total children; 1 son died of AIDS; 3 living children       Live:Lives alone      Employment: truck Geophysicist/field seismologist; Estate manager/land agent; Building surveyor; local      Tobacco: previous smoker; quit in 1967      Alcohol: none      Exercise: none; scared to exercise      ADLs: independent with ADLs; drives      Advanced Directives:  None; desires FULL CODE         Right-handed   Only occasional coffee or hot tea     No Known Allergies   Prior to Admission medications   Medication Sig Start Date End Date Taking? Authorizing Provider  amLODipine (NORVASC) 5 MG tablet Take 1 tablet (5 mg total) by mouth daily. 01/20/19  Yes  Rutherford Guys, MD  atorvastatin (LIPITOR) 20 MG tablet Take 1 tablet (20 mg total) by mouth daily. 01/20/19  Yes Rutherford Guys, MD  lisinopril (PRINIVIL,ZESTRIL) 20 MG tablet Take 1 tablet (20 mg total) by mouth daily. 01/20/19  Yes Rutherford Guys, MD  metFORMIN (GLUCOPHAGE) 500 MG tablet Take 1 tablet (500 mg total) by mouth 2 (two) times daily with a meal. 01/20/19  Yes Rutherford Guys, MD  sitaGLIPtin (JANUVIA) 100 MG tablet Take 1 tablet (100 mg total) by mouth daily. 01/20/19  Yes Rutherford Guys, MD  vitamin B-12 (CYANOCOBALAMIN) 1000 MCG tablet Take 1  tablet (1,000 mcg total) by mouth daily. 01/20/19  Yes Rutherford Guys, MD  Vitamin D, Ergocalciferol, (DRISDOL) 1.25 MG (50000 UT) CAPS capsule Take 1 capsule (50,000 Units total) by mouth every 7 (seven) days. 01/20/19  Yes Rutherford Guys, MD  gabapentin (NEURONTIN) 100 MG capsule Take 1-2 capsules (100-200 mg total) by mouth at bedtime as needed. Patient not taking: Reported on 02/02/2019 01/20/19   Rutherford Guys, MD     Depression screen [facility] 2/9 02/02/2019 01/20/2019 12/27/2018 12/15/2018 11/26/2018  Decreased Interest 0 0 0 0 0  Down, Depressed, Hopeless 0 0 0 0 0  PHQ - 2 Score 0 0 0 0 0     Fall Risk  02/02/2019 01/20/2019 12/27/2018 12/15/2018 11/26/2018  Falls in the past year? 0 0 0 0 0  Number falls in past yr: 0 0 0 0 -  Injury with Fall? 0 0 0 0 0  Follow up - Falls evaluation completed Falls evaluation completed - -      PHYSICAL EXAM: BP 139/78   Ht 6\' 1"  (1.854 m)   Wt 179 lb (81.2 kg)   BMI 23.62 kg/m    Wt Readings from Last 3 Encounters:  02/02/19 179 lb (81.2 kg)  12/29/18 179 lb (81.2 kg)  12/27/18 175 lb 6.4 oz (79.6 kg)     No exam data present    Physical Exam   Education/Counseling provided regarding diet and exercise, prevention of chronic diseases, smoking/tobacco cessation, if applicable, and reviewed "Covered Medicare Preventive Services."   ASSESSMENT/PLAN: There are no diagnoses linked to  this encounter.     Review of Systems:  N/A Cardiac Risk Factors include: advanced age (>17men, >76 women);diabetes mellitus;dyslipidemia;hypertension;male gender  Screening recommendations/referrals: Colonoscopy: stops at age 79 Recommended yearly ophthalmology/optometry visit for glaucoma screening and checkup Recommended yearly dental visit for hygiene and checkup     Not many social activities, does not want to do anything, "I am getting lazy", wants to sleep all day long, does not snore Does go to church Previously from [place]

## 2019-02-28 NOTE — Progress Notes (Addendum)
Presents today for [employer] Visit  I connected with  Theodore Frank on 02/28/19 by a  enabled telemedicine application phone call and verified that I am speaking with the correct person using two identifiers.   The patient expressed understanding and agreed to proceed.    Date of last exam: 01-20-2019  Interpreter used for this visit? No    Patient Care Team: Theodore Guys, MD as PCP - General (Family Medicine) Theodore Craver, MD as Consulting Physician (Gastroenterology)   Other items to address today:   Other Screening: Last screening for diabetes: 11/26/2018 Last lipid screening: 09/06/2018  ADVANCE DIRECTIVES: Discussed: yes  On File: NO Materials Provided: YES (MAILED)  Immunization status:  Immunization History  Administered Date(s) Administered  . Influenza, High Dose Seasonal PF 09/06/2018  . Influenza,inj,Quad PF,6+ Mos 06/27/2015, 06/13/2016, 08/06/2017  . Pneumococcal Conjugate-13 11/13/2016  . Pneumococcal Polysaccharide-23 11/17/2017     There are no preventive care reminders to display for this patient.   Functional Status Survey: Is the patient deaf or have difficulty hearing?: No Does the patient have difficulty seeing, even when wearing glasses/contacts?: No Does the patient have difficulty concentrating, remembering, or making decisions?: No Does the patient have difficulty walking or climbing stairs?: No Does the patient have difficulty dressing or bathing?: No Does the patient have difficulty doing errands alone such as visiting a doctor's office or shopping?: No   6CIT Screen 02/02/2019 11/17/2017  What Year? 0 points 0 points  What month? 0 points 0 points  What time? 0 points 0 points  Count back from 20 0 points 0 points  Months in reverse 0 points 0 points  Repeat phrase 0 points 0 points  Total Score 0 0        Office Visit from 02/02/2019 in [place] at 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   Patient Active Problem List   Diagnosis Date Noted  . Hypercalcemia 12/29/2018  . Vitamin D deficiency 12/27/2018  . COPD (chronic obstructive pulmonary disease) ([place]) 02/23/2018  . Dilation of thoracic aorta ([place]) 02/23/2018  . History of colon cancer 11/13/2016  . Anemia 05/06/2016  . Arthritis 05/06/2016  . Hypoglycemia 04/23/2016  . Chronic kidney disease, stage 3 ([place]) 04/23/2016  . HNP (herniated nucleus pulposus), lumbar 11/06/2014  . History of prostate cancer 04/05/2013  . S/P right inguinal hernia repair 04/02/2012  . Hypertension 01/30/2012  . Hyperthyroidism 01/30/2012  . Type 2 diabetes mellitus with complication, without long-term current use of insulin ([place]) 01/30/2012  . Hyperlipidemia 01/30/2012     Past Medical History:  Diagnosis Date  . Acute encephalopathy 04/23/2016  . Anemia   . Arthritis   . Cancer ([place]) 10/14/1999   colon cancer (surgery; no chemo; no radiation)  . Diabetes mellitus   . Hyperlipidemia   . Hypertension   . Prostate cancer ([place]) 10/13/1192   s/p prostatectomy  . Right inguinal hernia 02/25/2012  . Shortness of breath dyspnea    with exertion     Past Surgical History:  Procedure Laterality Date  . COLON SURGERY  2001   colon resection for colon cancer  . HERNIA REPAIR Right   . INGUINAL HERNIA REPAIR  03/05/2012   Procedure: HERNIA REPAIR INGUINAL ADULT;  Surgeon: Theodore Jarred, MD;  Location: [facility];  Service: General;  Laterality: Right;  Repair right inguinal hernia with mesh  . LUMBAR LAMINECTOMY/DECOMPRESSION MICRODISCECTOMY Left 11/06/2014   Procedure: LEFT LUMBAR THREE-FOUR LAMINECTOMY/DECOMPRESSION MICRODISCECTOMY 1 LEVEL;  Surgeon: Theodore Hoops, MD;  Location: [place] [facility];  Service: Neurosurgery;  Laterality: Left;  . PROSTATE SURGERY     prostatectomy for prostate  cancer     Family History  Problem Relation Age of Onset  . Hypertension Mother   . Thyroid disease Sister   . Diabetes Other      Social History   Socioeconomic History  . Marital status: Divorced    Spouse name: Not on file  . Number of children: 4  . Years of education: 65  . Highest education level: Not on file  Occupational History  . Occupation: Training and development officer  Social Needs  . Financial resource strain: Not hard at all  . Food insecurity:    Worry: Never true    Inability: Never true  . Transportation needs:    Medical: No    Non-medical: No  Tobacco Use  . Smoking status: Former Smoker    Last attempt to quit: 10/13/1965    Years since quitting: 53.4  . Smokeless tobacco: Never Used  Substance and Sexual Activity  . Alcohol use: No  . Drug use: No  . Sexual activity: Yes    Birth control/protection: None  Lifestyle  . Physical activity:    Days per week: 0 days    Minutes per session: 0 min  . Stress: Not at all  Relationships  . Social connections:    Talks on phone: Never    Gets together: Never    Attends religious service: More than 4 times per year    Active member of club or organization: No    Attends meetings of clubs or organizations: Never    Relationship status: Divorced  . Intimate partner violence:    Fear of current or ex partner: No    Emotionally abused: No    Physically abused: No    Forced sexual activity: No  Other Topics Concern  . Not on file  Social History Narrative   Marital status: divorced; single in 2018; not dating      Children: 4 total children; 1 son died of AIDS; 3 living children       Live:Lives alone      Employment: truck Geophysicist/field seismologist; Estate manager/land agent; Building surveyor; local      Tobacco: previous smoker; quit in 1967      Alcohol: none      Exercise: none; scared to exercise      ADLs: independent with ADLs; drives      Advanced Directives:  None; desires FULL CODE         Right-handed   Only occasional coffee  or hot tea     No Known Allergies   Prior to Admission medications   Medication Sig Start Date End Date Taking? Authorizing Provider  amLODipine (NORVASC) 5 MG tablet Take 1 tablet (5 mg total) by mouth daily. 01/20/19  Yes Theodore Guys, MD  atorvastatin (LIPITOR) 20 MG tablet Take 1 tablet (20 mg total) by mouth daily. 01/20/19  Yes Theodore Guys, MD  lisinopril (PRINIVIL,ZESTRIL) 20 MG tablet Take 1 tablet (20 mg total) by mouth daily. 01/20/19  Yes Theodore Guys, MD  metFORMIN (GLUCOPHAGE) 500 MG tablet Take 1 tablet (500 mg total) by mouth 2 (two) times daily with  a meal. 01/20/19  Yes Theodore Guys, MD  sitaGLIPtin (JANUVIA) 100 MG tablet Take 1 tablet (100 mg total) by mouth daily. 01/20/19  Yes Theodore Guys, MD  vitamin B-12 (CYANOCOBALAMIN) 1000 MCG tablet Take 1 tablet (1,000 mcg total) by mouth daily. 01/20/19  Yes Theodore Guys, MD  Vitamin D, Ergocalciferol, (DRISDOL) 1.25 MG (50000 UT) CAPS capsule Take 1 capsule (50,000 Units total) by mouth every 7 (seven) days. 01/20/19  Yes Theodore Guys, MD  gabapentin (NEURONTIN) 100 MG capsule Take 1-2 capsules (100-200 mg total) by mouth at bedtime as needed. Patient not taking: Reported on 02/02/2019 01/20/19   Theodore Guys, MD     Depression screen [facility] 2/9 02/02/2019 01/20/2019 12/27/2018 12/15/2018 11/26/2018  Decreased Interest 0 0 0 0 0  Down, Depressed, Hopeless 0 0 0 0 0  PHQ - 2 Score 0 0 0 0 0     Fall Risk  02/02/2019 01/20/2019 12/27/2018 12/15/2018 11/26/2018  Falls in the past year? 0 0 0 0 0  Number falls in past yr: 0 0 0 0 -  Injury with Fall? 0 0 0 0 0  Follow up - Falls evaluation completed Falls evaluation completed - -      PHYSICAL EXAM: BP 139/78   Ht 6\' 1"  (1.854 m)   Wt 179 lb (81.2 kg)   BMI 23.62 kg/m    Wt Readings from Last 3 Encounters:  02/02/19 179 lb (81.2 kg)  12/29/18 179 lb (81.2 kg)  12/27/18 175 lb 6.4 oz (79.6 kg)     No exam data present    Physical Exam    Education/Counseling provided regarding diet and exercise, prevention of chronic diseases, smoking/tobacco cessation, if applicable, and reviewed "Covered Medicare Preventive Services."   ASSESSMENT/PLAN: There are no diagnoses linked to this encounter.     Review of Systems:  N/A Cardiac Risk Factors include: advanced age (>21men, >23 women);diabetes mellitus;dyslipidemia;hypertension;male gender  Screening recommendations/referrals: Colonoscopy: stops at age 59 Recommended yearly ophthalmology/optometry visit for glaucoma screening and checkup Recommended yearly dental visit for hygiene and checkup     Not many social activities, does not want to do anything, "I am getting lazy", wants to sleep all day long, does not snore Does go to church Previously from [place]

## 2019-03-28 NOTE — Progress Notes (Deleted)
Name: Theodore Frank  MRN/ DOB: [id number], November 17, 1935    Age/ Sex: 83 y.o., male     PCP: Rutherford Guys, MD   Reason for Endocrinology Evaluation: Hypercalcemia      Initial Endocrinology Clinic Visit: 12/29/2018    PATIENT IDENTIFIER: [name] is a 83 y.o., male with a past medical history of HTN, T2DM, CKD and hypercalcemia. He has followed with [facility] since 12/29/2018 for consultative assistance with management of his hypercalcemia .   HISTORICAL SUMMARY: The patient was first diagnosed with hypercalcemia in 2019, during routine workup. In review of his records the patient has been noted to have intermittent hypercalcemia since 2013 with highest calcium level of 11.6 mg/dL (non-corrected)   SUBJECTIVE:    Today (03/28/2019):  Theodore Frank is here for a 3 month follow up on hypercalcemia .    ROS:  As per HPI.   HISTORY:  Past Medical History:  Past Medical History:  Diagnosis Date   Acute encephalopathy 04/23/2016   Anemia    Arthritis    Cancer ([place]) 10/14/1999   colon cancer (surgery; no chemo; no radiation)   Diabetes mellitus    Hyperlipidemia    Hypertension    Prostate cancer ([place]) 10/13/1192   s/p prostatectomy   Right inguinal hernia 02/25/2012   Shortness of breath dyspnea    with exertion    Past Surgical History:  Past Surgical History:  Procedure Laterality Date   COLON SURGERY  2001   colon resection for colon cancer   HERNIA REPAIR Right    INGUINAL HERNIA REPAIR  03/05/2012   Procedure: HERNIA REPAIR INGUINAL ADULT;  Surgeon: [name], MD;  Location: [place];  Service: General;  Laterality: Right;  Repair right inguinal hernia with mesh   LUMBAR LAMINECTOMY/DECOMPRESSION MICRODISCECTOMY Left 11/06/2014   Procedure: LEFT LUMBAR THREE-FOUR LAMINECTOMY/DECOMPRESSION MICRODISCECTOMY 1 LEVEL;  Surgeon: [name], MD;  Location: [place] [facility];  Service: Neurosurgery;  Laterality: Left;     PROSTATE SURGERY     prostatectomy for prostate cancer     Social History:  reports that he quit smoking about 53 years ago. He has never used smokeless tobacco. He reports that he does not drink alcohol or use drugs. Family History:  Family History  Problem Relation Age of Onset   Hypertension Mother    Thyroid disease Sister    Diabetes Other       HOME MEDICATIONS: Allergies as of 03/29/2019   No Known Allergies     Medication List       Accurate as of March 28, 2019  4:27 PM. If you have any questions, ask your nurse or doctor.        amLODipine 5 MG tablet Commonly known as: NORVASC Take 1 tablet (5 mg total) by mouth daily.   atorvastatin 20 MG tablet Commonly known as: LIPITOR Take 1 tablet (20 mg total) by mouth daily.   gabapentin 100 MG capsule Commonly known as: NEURONTIN Take 1-2 capsules (100-200 mg total) by mouth at bedtime as needed.   lisinopril 20 MG tablet Commonly known as: ZESTRIL Take 1 tablet (20 mg total) by mouth daily.   metFORMIN 500 MG tablet Commonly known as: GLUCOPHAGE Take 1 tablet (500 mg total) by mouth 2 (two) times daily with a meal.   sitaGLIPtin 100 MG tablet Commonly known as: Januvia Take 1 tablet (100 mg total) by mouth daily.   vitamin B-12 1000 MCG tablet Commonly known  as: CYANOCOBALAMIN Take 1 tablet (1,000 mcg total) by mouth daily.   Vitamin D (Ergocalciferol) 1.25 MG (50000 UT) Caps capsule Commonly known as: DRISDOL Take 1 capsule (50,000 Units total) by mouth every 7 (seven) days.         OBJECTIVE:   PHYSICAL EXAM: VS: There were no vitals taken for this visit.   EXAM: General: Pt appears well and is in NAD  Hydration: Well-hydrated with moist mucous membranes and good skin turgor  Eyes: External eye exam normal without stare, lid lag or exophthalmos.  EOM intact.  PERRL.  Ears, Nose, Throat: Hearing: Grossly intact bilaterally Dental: Good dentition  Throat: Clear without mass,  erythema or exudate  Neck: General: Supple without adenopathy. Thyroid: Thyroid size normal.  No goiter or nodules appreciated. No thyroid bruit.  Lungs: Clear with good BS bilat with no rales, rhonchi, or wheezes  Heart: Auscultation: RRR.  Abdomen: Normoactive bowel sounds, soft, nontender, without masses or organomegaly palpable  Extremities: Gait and station: Normal gait  Digits and nails: No clubbing, cyanosis, petechiae, or nodes Head and neck: Normal alignment and mobility BL UE: Normal ROM and strength. BL LE: No pretibial edema normal ROM and strength.  Skin: Hair: Texture and amount normal with gender appropriate distribution Skin Inspection: No rashes, acanthosis nigricans/skin tags. No lipohypertrophy Skin Palpation: Skin temperature, texture, and thickness normal to palpation  Neuro: Cranial nerves: II - XII grossly intact  Cerebellar: Normal coordination and movement; no tremor Motor: Normal strength throughout DTRs: 2+ and symmetric in UE without delay in relaxation phase  Mental Status: Judgment, insight: Intact Orientation: Oriented to time, place, and person Memory: Intact for recent and remote events Mood and affect: No depression, anxiety, or agitation     DATA REVIEWED: ***    ASSESSMENT / PLAN / RECOMMENDATIONS:   1. ***  Plan:  ***    Medications   ***   Signed electronically by: Mack Guise, MD  [facility]  [address] [place], [place] [address] Phone: [phone] FAX: [phone]      CC: Rutherford Guys, MD [address] [place] [address] Phone: [phone]  Fax: [phone]   Return to Endocrinology clinic as below: Future Appointments  Date Time Provider [place]  03/29/2019  7:50 AM Zyler Hyson, Melanie Crazier, MD [facility] None  04/18/2019  8:00 AM PCP-NURSE [facility]  04/21/2019  8:20 AM Rutherford Guys, MD PCP-PCP [facility]

## 2019-03-29 ENCOUNTER — Ambulatory Visit: Payer: Medicare Other | Admitting: Internal Medicine

## 2019-04-18 ENCOUNTER — Other Ambulatory Visit: Payer: Self-pay

## 2019-04-18 ENCOUNTER — Ambulatory Visit (INDEPENDENT_AMBULATORY_CARE_PROVIDER_SITE_OTHER): Payer: Medicare Other | Admitting: Family Medicine

## 2019-04-18 DIAGNOSIS — E559 Vitamin D deficiency, unspecified: Secondary | ICD-10-CM | POA: Diagnosis not present

## 2019-04-18 DIAGNOSIS — I10 Essential (primary) hypertension: Secondary | ICD-10-CM

## 2019-04-18 DIAGNOSIS — E1122 Type 2 diabetes mellitus with diabetic chronic kidney disease: Secondary | ICD-10-CM

## 2019-04-19 LAB — COMPREHENSIVE METABOLIC PANEL
ALT: 14 IU/L (ref 0–44)
AST: 17 IU/L (ref 0–40)
Albumin/Globulin Ratio: 1.6 (ref 1.2–2.2)
Albumin: 4.2 g/dL (ref 3.6–4.6)
Alkaline Phosphatase: 52 IU/L (ref 39–117)
BUN/Creatinine Ratio: 14 (ref 10–24)
BUN: 17 mg/dL (ref 8–27)
Bilirubin Total: 0.7 mg/dL (ref 0.0–1.2)
CO2: 20 mmol/L (ref 20–29)
Calcium: 10.6 mg/dL — ABNORMAL HIGH (ref 8.6–10.2)
Chloride: 108 mmol/L — ABNORMAL HIGH (ref 96–106)
Creatinine, Ser: 1.25 mg/dL (ref 0.76–1.27)
GFR calc Af Amer: 62 mL/min/{1.73_m2} (ref >59)
GFR calc non Af Amer: 54 mL/min/{1.73_m2} — ABNORMAL LOW (ref >59)
Globulin, Total: 2.7 g/dL (ref 1.5–4.5)
Glucose: 98 mg/dL (ref 65–99)
Potassium: 4 mmol/L (ref 3.5–5.2)
Sodium: 142 mmol/L (ref 134–144)
Total Protein: 6.9 g/dL (ref 6.0–8.5)

## 2019-04-19 LAB — HEMOGLOBIN A1C
Est. average glucose Bld gHb Est-mCnc: 105 mg/dL
Hgb A1c MFr Bld: 5.3 % (ref 4.8–5.6)

## 2019-04-19 LAB — VITAMIN D 25 HYDROXY (VIT D DEFICIENCY, FRACTURES): Vit D, 25-Hydroxy: 73 ng/mL (ref 30.0–100.0)

## 2019-04-20 NOTE — Progress Notes (Signed)
Lab visit only.  Did not see provider at this visit.  

## 2019-04-21 ENCOUNTER — Encounter: Payer: Self-pay | Admitting: Family Medicine

## 2019-04-21 ENCOUNTER — Other Ambulatory Visit: Payer: Self-pay

## 2019-04-21 ENCOUNTER — Ambulatory Visit (INDEPENDENT_AMBULATORY_CARE_PROVIDER_SITE_OTHER): Payer: Medicare Other | Admitting: Family Medicine

## 2019-04-21 VITALS — BP 147/83 | HR 77 | Temp 97.9°F | Resp 16 | Ht 73.0 in | Wt 173.2 lb

## 2019-04-21 DIAGNOSIS — E559 Vitamin D deficiency, unspecified: Secondary | ICD-10-CM | POA: Diagnosis not present

## 2019-04-21 DIAGNOSIS — N529 Male erectile dysfunction, unspecified: Secondary | ICD-10-CM | POA: Diagnosis not present

## 2019-04-21 DIAGNOSIS — I10 Essential (primary) hypertension: Secondary | ICD-10-CM

## 2019-04-21 DIAGNOSIS — E118 Type 2 diabetes mellitus with unspecified complications: Secondary | ICD-10-CM | POA: Diagnosis not present

## 2019-04-21 MED ORDER — SITAGLIPTIN PHOSPHATE 100 MG PO TABS: 100.0000 mg | ORAL_TABLET | Freq: Every day | ORAL | 3 refills | Status: DC

## 2019-04-21 MED ORDER — SILDENAFIL CITRATE 100 MG PO TABS: 50.0000 mg | ORAL_TABLET | Freq: Every day | ORAL | 4 refills | Status: DC | PRN

## 2019-04-21 NOTE — Patient Instructions (Addendum)
1. Start over the counter vitamin D 800 units a day 2. Please reschedule your appointment with your endocrinologist, Dr. Kelton Pillar, [phone]  [employer]   If you have lab work done today you will be contacted with your lab results within the next 2 weeks.  If you have not heard from [place] then please contact us. The fastest way to get your results is to register for My Chart.   IF you received an x-ray today, you will receive an invoice from [facility]. Please contact [facility] at [phone] with questions or concerns regarding your invoice.   IF you received labwork today, you will receive an invoice from [facility]. Please contact [facility] at [phone] with questions or concerns regarding your invoice.   Our billing staff will not be able to assist you with questions regarding bills from these companies.  You will be contacted with the lab results as soon as they are available. The fastest way to get your results is to activate your My Chart account. Instructions are located on the last page of this paperwork. If you have not heard from [place] regarding the results in 2 weeks, please contact this office.

## 2019-04-21 NOTE — Progress Notes (Signed)
7/9/20208:19 AM  Theodore Frank [birth year], 83 y.o., male [id number]  Chief Complaint  Patient presents with  . Hypertension    3 follow up  . Medication Refill    Januvia    HPI:   Patient is a 83 y.o. male with past medical history significant for HTN, DM2, CKD, HLP, prostate and colon cancer, hypercalcemia, vitamin D deficiency, basal cell carcinoma who presents today for routine follow-up  Last OV April 2020 Checks cbgs at home, 110-130s Denies any lows  Has not taken his medications this morning, incl BP Check BP at home, 130-140/70-80s  Overall doing well Not really enjoying his elliptical so just got device to make his bike stationary as he does enjoy bike riding Having issues with sleep cycle, has found himself watching billiard tournaments at 3-4 am He is in a new relationship, requesting medication for ED   Lab Results  Component Value Date   HGBA1C 5.3 04/18/2019   HGBA1C 8.2 (A) 11/26/2018   HGBA1C 7.5 (H) 10/01/2018   Lab Results  Component Value Date   MICROALBUR 0.72 03/21/2014   LDLCALC 96 09/06/2018   CREATININE 1.25 04/18/2019   Vitamin D 04/18/2019 - 73.0  Depression screen PHQ 2/9 04/21/2019 02/02/2019 01/20/2019  Decreased Interest 0 0 0  Down, Depressed, Hopeless 0 0 0  PHQ - 2 Score 0 0 0    Fall Risk  04/21/2019 02/02/2019 01/20/2019 12/27/2018 12/15/2018  Falls in the past year? 0 0 0 0 0  Number falls in past yr: - 0 0 0 0  Injury with Fall? - 0 0 0 0  Follow up Falls evaluation completed - Falls evaluation completed Falls evaluation completed -     No Known Allergies  Prior to Admission medications   Medication Sig Start Date End Date Taking? Authorizing Provider  amLODipine (NORVASC) 5 MG tablet Take 1 tablet (5 mg total) by mouth daily. 01/20/19  Yes Rutherford Guys, MD  atorvastatin (LIPITOR) 20 MG tablet Take 1 tablet (20 mg total) by mouth daily. 01/20/19  Yes Rutherford Guys, MD  gabapentin (NEURONTIN) 100 MG capsule Take 1-2 capsules  (100-200 mg total) by mouth at bedtime as needed. 01/20/19  Yes Rutherford Guys, MD  lisinopril (PRINIVIL,ZESTRIL) 20 MG tablet Take 1 tablet (20 mg total) by mouth daily. 01/20/19  Yes Rutherford Guys, MD  metFORMIN (GLUCOPHAGE) 500 MG tablet Take 1 tablet (500 mg total) by mouth 2 (two) times daily with a meal. 01/20/19  Yes Rutherford Guys, MD  sitaGLIPtin (JANUVIA) 100 MG tablet Take 1 tablet (100 mg total) by mouth daily. 01/20/19  Yes Rutherford Guys, MD  vitamin B-12 (CYANOCOBALAMIN) 1000 MCG tablet Take 1 tablet (1,000 mcg total) by mouth daily. 01/20/19  Yes Rutherford Guys, MD  Vitamin D, Ergocalciferol, (DRISDOL) 1.25 MG (50000 UT) CAPS capsule Take 1 capsule (50,000 Units total) by mouth every 7 (seven) days. 01/20/19  Yes Rutherford Guys, MD    Past Medical History:  Diagnosis Date  . Acute encephalopathy 04/23/2016  . Anemia   . Arthritis   . Cancer ([place]) 10/14/1999   colon cancer (surgery; no chemo; no radiation)  . Diabetes mellitus   . Hyperlipidemia   . Hypertension   . Prostate cancer ([place]) 10/13/1192   s/p prostatectomy  . Right inguinal hernia 02/25/2012  . Shortness of breath dyspnea    with exertion    Past Surgical History:  Procedure Laterality Date  . COLON SURGERY  2001  colon resection for colon cancer  . HERNIA REPAIR Right   . INGUINAL HERNIA REPAIR  03/05/2012   Procedure: HERNIA REPAIR INGUINAL ADULT;  Surgeon: [name], MD;  Location: [facility];  Service: General;  Laterality: Right;  Repair right inguinal hernia with mesh  . LUMBAR LAMINECTOMY/DECOMPRESSION MICRODISCECTOMY Left 11/06/2014   Procedure: LEFT LUMBAR THREE-FOUR LAMINECTOMY/DECOMPRESSION MICRODISCECTOMY 1 LEVEL;  Surgeon: [name], MD;  Location: [facility];  Service: Neurosurgery;  Laterality: Left;  . PROSTATE SURGERY     prostatectomy for prostate cancer    Social History   Tobacco Use  . Smoking status: Former Smoker    Quit date: 10/13/1965    Years since  quitting: 53.5  . Smokeless tobacco: Never Used  Substance Use Topics  . Alcohol use: No    Family History  Problem Relation Age of Onset  . Hypertension Mother   . Thyroid disease Sister   . Diabetes Other     Review of Systems  Constitutional: Negative for chills and fever.  Respiratory: Negative for cough and shortness of breath.   Cardiovascular: Negative for chest pain, palpitations and leg swelling.  Gastrointestinal: Negative for abdominal pain, nausea and vomiting.     OBJECTIVE:  Today's Vitals   04/21/19 0814  BP: (!) 147/83  Pulse: 77  Resp: 16  Temp: 97.9 F (36.6 C)  TempSrc: Oral  SpO2: 98%  Weight: 173 lb 3.2 oz (78.6 kg)  Height: 6\' 1"  (1.854 m)   Body mass index is 22.85 kg/m.   Physical Exam Vitals signs and nursing note reviewed.  Constitutional:      Appearance: He is well-developed.  HENT:     Head: Normocephalic and atraumatic.  Eyes:     Conjunctiva/sclera: Conjunctivae normal.     Pupils: Pupils are equal, round, and reactive to light.  Neck:     Musculoskeletal: Neck supple.  Cardiovascular:     Rate and Rhythm: Normal rate and regular rhythm.     Heart sounds: No murmur. No friction rub. No gallop.   Pulmonary:     Effort: Pulmonary effort is normal.     Breath sounds: Normal breath sounds. No wheezing or rales.  Skin:    General: Skin is warm and dry.  Neurological:     Mental Status: He is alert and oriented to person, place, and time.     ASSESSMENT and PLAN  1. Type 2 diabetes mellitus with complication, without long-term current use of insulin (HCC) Controlled. Continue current regime.   2. Essential hypertension Acceptable for age, continue with current regime  3. Vitamin D deficiency At goal. Stop high dose vitamin D. Start OTC 800 units a day or obtain thru food.  4. Hypercalcemia Improved, off HCTZ, missed endo appt, asked to reschedule  5. Erectile dysfunction, unspecified erectile dysfunction type rx  for viagra given, no known CAD, reviewed r/se/b  Other orders - sitaGLIPtin (JANUVIA) 100 MG tablet; Take 1 tablet (100 mg total) by mouth daily. - sildenafil (VIAGRA) 100 MG tablet; Take 0.5-1 tablets (50-100 mg total) by mouth daily as needed for erectile dysfunction.  Return in about 6 months (around 10/22/2019).    Rutherford Guys, MD Primary Care at [facility] [place], [place] [address] Ph.  [phone] Fax [phone]

## 2019-04-22 ENCOUNTER — Encounter: Payer: Self-pay | Admitting: Internal Medicine

## 2019-04-22 ENCOUNTER — Ambulatory Visit (INDEPENDENT_AMBULATORY_CARE_PROVIDER_SITE_OTHER): Payer: Medicare Other | Admitting: Internal Medicine

## 2019-04-22 NOTE — Progress Notes (Signed)
Name: Theodore Frank  MRN/ DOB: [id number], 1936-04-18    Age/ Sex: 83 y.o., male     PCP: Rutherford Guys, MD   Reason for Endocrinology Evaluation: Hypercalcemia      Initial Endocrinology Clinic Visit: 12/29/18    PATIENT IDENTIFIER: Theodore Frank is a 83 y.o., male with a past medical history of HTN, Hx of colon and prostate cancer.. He has followed with [facility] since 12/29/2018 for consultative assistance with management of his Hypercalcemia.   HISTORICAL SUMMARY: The patient was first noted with hypercalcemia in 2013.  HCTZ stopped in 11/2018 SUBJECTIVE:    Today (04/22/2019):  Theodore Frank is here for follow up on hypercalcemia. Today he states compliance with vitamin D. He denies polyuria/polydispsia or constipation .  He denies renal stones.    ROS:  As per HPI.   HISTORY:  Past Medical History:  Past Medical History:  Diagnosis Date  . Acute encephalopathy 04/23/2016  . Anemia   . Arthritis   . Cancer ([place]) 10/14/1999   colon cancer (surgery; no chemo; no radiation)  . Diabetes mellitus   . Hyperlipidemia   . Hypertension   . Prostate cancer ([place]) 10/13/1192   s/p prostatectomy  . Right inguinal hernia 02/25/2012  . Shortness of breath dyspnea    with exertion   Past Surgical History:  Past Surgical History:  Procedure Laterality Date  . COLON SURGERY  2001   colon resection for colon cancer  . HERNIA REPAIR Right   . INGUINAL HERNIA REPAIR  03/05/2012   Procedure: HERNIA REPAIR INGUINAL ADULT;  Surgeon: [name], MD;  Location: [facility];  Service: General;  Laterality: Right;  Repair right inguinal hernia with mesh  . LUMBAR LAMINECTOMY/DECOMPRESSION MICRODISCECTOMY Left 11/06/2014   Procedure: LEFT LUMBAR THREE-FOUR LAMINECTOMY/DECOMPRESSION MICRODISCECTOMY 1 LEVEL;  Surgeon: [name], MD;  Location: [facility];  Service: Neurosurgery;  Laterality: Left;  . PROSTATE SURGERY     prostatectomy for prostate  cancer    Social History:  reports that he quit smoking about 53 years ago. He has never used smokeless tobacco. He reports that he does not drink alcohol or use drugs. Family History:  Family History  Problem Relation Age of Onset  . Hypertension Mother   . Thyroid disease Sister   . Diabetes Other      HOME MEDICATIONS: Allergies as of 04/22/2019   No Known Allergies     Medication List       Accurate as of April 22, 2019 12:41 PM. If you have any questions, ask your nurse or doctor.        amLODipine 5 MG tablet Commonly known as: NORVASC Take 1 tablet (5 mg total) by mouth daily.   atorvastatin 20 MG tablet Commonly known as: LIPITOR Take 1 tablet (20 mg total) by mouth daily.   gabapentin 100 MG capsule Commonly known as: NEURONTIN Take 1-2 capsules (100-200 mg total) by mouth at bedtime as needed.   lisinopril 20 MG tablet Commonly known as: ZESTRIL Take 1 tablet (20 mg total) by mouth daily.   metFORMIN 500 MG tablet Commonly known as: GLUCOPHAGE Take 1 tablet (500 mg total) by mouth 2 (two) times daily with a meal.   sildenafil 100 MG tablet Commonly known as: VIAGRA Take 0.5-1 tablets (50-100 mg total) by mouth daily as needed for erectile dysfunction.   sitaGLIPtin 100 MG tablet Commonly known as: Januvia Take 1 tablet (100 mg total) by mouth  daily.   vitamin B-12 1000 MCG tablet Commonly known as: CYANOCOBALAMIN Take 1 tablet (1,000 mcg total) by mouth daily.         OBJECTIVE:   PHYSICAL EXAM: VS: BP (!) 144/84 (BP Location: Left Arm, Patient Position: Sitting, Cuff Size: Normal)   Pulse 68   Temp 97.7 F (36.5 C)   Ht 6\' 1"  (1.854 m)   Wt 175 lb 12.8 oz (79.7 kg)   SpO2 97%   BMI 23.19 kg/m    EXAM: General: Pt appears well and is in NAD  Neck: General: Supple without adenopathy. Thyroid: Thyroid size normal.  No goiter or nodules appreciated. No thyroid bruit.  Lungs: Clear with good BS bilat with no rales, rhonchi, or wheezes   Heart: Auscultation: RRR.  Extremities:  BL LE: No pretibial edema normal ROM and strength.  Skin: Hair: Texture and amount normal with gender appropriate distribution Skin Inspection: No rashes. Skin Palpation: Skin temperature, texture, and thickness normal to palpation  Mental Status: Judgment, insight: Intact Orientation: Oriented to time, place, and person Mood and affect: No depression, anxiety, or agitation     DATA REVIEWED: Results for Theodore Frank, Theodore Frank (MRN [id number]) as of 04/22/2019 12:41  Ref. Range 04/18/2019 09:24  Sodium Latest Ref Range: 134 - 144 mmol/L 142  Potassium Latest Ref Range: 3.5 - 5.2 mmol/L 4.0  Chloride Latest Ref Range: 96 - 106 mmol/L 108 (H)  CO2 Latest Ref Range: 20 - 29 mmol/L 20  Glucose Latest Ref Range: 65 - 99 mg/dL 98  BUN Latest Ref Range: 8 - 27 mg/dL 17  Creatinine Latest Ref Range: 0.76 - 1.27 mg/dL 1.25  Calcium Latest Ref Range: 8.6 - 10.2 mg/dL 10.6 (H)  BUN/Creatinine Ratio Latest Ref Range: 10 - 24  14  Alkaline Phosphatase Latest Ref Range: 39 - 117 IU/L 52  Albumin Latest Ref Range: 3.6 - 4.6 g/dL 4.2  Albumin/Globulin Ratio Latest Ref Range: 1.2 - 2.2  1.6  AST Latest Ref Range: 0 - 40 IU/L 17  ALT Latest Ref Range: 0 - 44 IU/L 14  Total Protein Latest Ref Range: 6.0 - 8.5 g/dL 6.9  Total Bilirubin Latest Ref Range: 0.0 - 1.2 mg/dL 0.7  GFR, Est Non African American Latest Ref Range: >59 mL/min/1.73 54 (L)  GFR, Est African American Latest Ref Range: >59 mL/min/1.73 62  Vitamin D, 25-Hydroxy Latest Ref Range: 30.0 - 100.0 ng/mL 73.0  Globulin, Total Latest Ref Range: 1.5 - 4.5 g/dL 2.7      ASSESSMENT / PLAN / RECOMMENDATIONS:   1. PTH- Mediated Hypercalcemia :   - Pt is asymptomatic  - We discussed D/D of - Differential diagnosis includes Familial Hypocalciuric hypercalcemia ([place]) vs Primary hyperparathyroidism (pHPT) - I explain to him the consequences of hypercalcemia in deteriorating kidney function and causing  osteoporosis if related to pHPT.  - Will proceed with 24-hr collections, instructions discussed in detail with the patient.     F/u in 3 months    Signed electronically by: Mack Guise, MD  [facility] Endocrinology  [facility] [address] [place], [place] [address] Phone: [phone] FAX: [phone]      CC: Rutherford Guys, MD [address] [place] [address] Phone: [phone]  Fax: [phone]   Return to Endocrinology clinic as below: Future Appointments  Date Time Provider [place]  07/25/2019  7:30 AM Shamleffer, Melanie Crazier, MD [facility] None  10/17/2019  8:40 AM Rutherford Guys, MD [facility]

## 2019-04-22 NOTE — Patient Instructions (Signed)

## 2019-04-25 ENCOUNTER — Other Ambulatory Visit: Payer: Medicare Other

## 2019-04-25 ENCOUNTER — Other Ambulatory Visit: Payer: Self-pay

## 2019-04-26 LAB — CREATININE, URINE, 24 HOUR: Creatinine, 24H Ur: 1.49 g/(24.h) (ref 0.50–2.15)

## 2019-04-26 LAB — CALCIUM, URINE, 24 HOUR: Calcium, 24H Urine: 147 mg/24 h

## 2019-05-02 ENCOUNTER — Telehealth: Payer: Self-pay | Admitting: Internal Medicine

## 2019-05-02 NOTE — Telephone Encounter (Signed)
Left a message for the pt to call back     His calcium/creatinine ratio in the 24-hr urine is 0.011 , this is equivocal as in [place] is it < 0.010 and if primary hyperparathyroidism it is > 0.020   Since his is closer to [facility] at 0.011 , no intervention needed at this time and will continue to monitor.  Abby Nena Jordan, MD  [facility] Endocrinology  [facility] [address] [place], [place] [address] Phone: [phone] FAX: [phone]

## 2019-05-02 NOTE — Telephone Encounter (Signed)
Discussed results with the patient on 7/20 at [place], MD  [facility] Endocrinology  [facility] [address] [place], [place] [address] Phone: [phone] FAX: [phone]

## 2019-05-09 DIAGNOSIS — C4401 Basal cell carcinoma of skin of lip: Secondary | ICD-10-CM | POA: Diagnosis not present

## 2019-05-25 ENCOUNTER — Telehealth: Payer: Self-pay | Admitting: Family Medicine

## 2019-05-25 DIAGNOSIS — E1122 Type 2 diabetes mellitus with diabetic chronic kidney disease: Secondary | ICD-10-CM

## 2019-05-25 NOTE — Telephone Encounter (Signed)
Patient requesting a 90 day supply metFORMIN (GLUCOPHAGE) 500 MG tablet , informed patient please allow 48 to 72 hour turn around, pleas advise  [place], [place] [employer] [phone] (Phone) [phone] (Fax)

## 2019-06-27 ENCOUNTER — Other Ambulatory Visit: Payer: Self-pay | Admitting: Family Medicine

## 2019-06-27 DIAGNOSIS — E1122 Type 2 diabetes mellitus with diabetic chronic kidney disease: Secondary | ICD-10-CM

## 2019-06-27 MED ORDER — METFORMIN HCL 500 MG PO TABS: 500.0000 mg | ORAL_TABLET | Freq: Two times a day (BID) | ORAL | 0 refills | Status: DC

## 2019-06-27 NOTE — Telephone Encounter (Signed)
metFORMIN (GLUCOPHAGE) 500 MG tablet   Send to [employer]

## 2019-07-08 ENCOUNTER — Other Ambulatory Visit: Payer: Self-pay | Admitting: Family Medicine

## 2019-07-08 DIAGNOSIS — E7800 Pure hypercholesterolemia, unspecified: Secondary | ICD-10-CM

## 2019-07-08 DIAGNOSIS — I10 Essential (primary) hypertension: Secondary | ICD-10-CM

## 2019-07-08 NOTE — Telephone Encounter (Signed)
Forwarding medication refill request to the clinical pool for review. 

## 2019-07-11 ENCOUNTER — Other Ambulatory Visit: Payer: Self-pay | Admitting: Family Medicine

## 2019-07-11 DIAGNOSIS — E1122 Type 2 diabetes mellitus with diabetic chronic kidney disease: Secondary | ICD-10-CM

## 2019-07-11 DIAGNOSIS — E1142 Type 2 diabetes mellitus with diabetic polyneuropathy: Secondary | ICD-10-CM

## 2019-07-11 NOTE — Telephone Encounter (Signed)
Requested medication (s) are due for refill today: yes  Requested medication (s) are on the active medication list: yes  Last refill:  06/27/2019  Future visit scheduled: yes  Notes to clinic:  Requesting  1 year supply   Requested Prescriptions  Pending Prescriptions Disp Refills   metFORMIN (GLUCOPHAGE) 500 MG tablet [Pharmacy Med Name: MetFORMIN 500MG TABLET] 180 tablet 3    Sig: TAKE 1 TABLET BY MOUTH  TWICE DAILY WITH A MEAL     Endocrinology:  Diabetes - Biguanides Passed - 07/11/2019  9:36 AM      Passed - Cr in normal range and within 360 days    Creat  Date Value Ref Range Status  04/25/2016 1.48 (H) 0.70 - 1.18 mg/dL Final    Comment:      For patients > or = 81 years of age: The upper reference limit for Creatinine is approximately 13% higher for people identified as African-American.      Creatinine, Ser  Date Value Ref Range Status  04/18/2019 1.25 0.76 - 1.27 mg/dL Final         Passed - HBA1C is between 0 and 7.9 and within 180 days    Hgb A1c MFr Bld  Date Value Ref Range Status  04/18/2019 5.3 4.8 - 5.6 % Final    Comment:             Prediabetes: 5.7 - 6.4          Diabetes: >6.4          Glycemic control for adults with diabetes: <7.0          Passed - eGFR in normal range and within 360 days    GFR, Est African American  Date Value Ref Range Status  02/29/2016 71 >=60 mL/min Final   GFR calc Af Amer  Date Value Ref Range Status  04/18/2019 62 >59 mL/min/1.73 Final   GFR, Est Non African American  Date Value Ref Range Status  02/29/2016 62 >=60 mL/min Final    Comment:      The estimated GFR is a calculation valid for adults (>=54 years old) that uses the CKD-EPI algorithm to adjust for age and sex. It is   not to be used for children, pregnant women, hospitalized patients,    patients on dialysis, or with rapidly changing kidney function. According to the NKDEP, eGFR >89 is normal, 60-89 shows mild impairment, 30-59 shows moderate  impairment, 15-29 shows severe impairment and <15 is ESRD.      GFR calc non Af Amer  Date Value Ref Range Status  04/18/2019 54 (L) >59 mL/min/1.73 Final   GFR  Date Value Ref Range Status  12/29/2018 65.31 >60.00 mL/min Final         Passed - Valid encounter within last 6 months    Recent Outpatient Visits          2 months ago Type 2 diabetes mellitus with complication, without long-term current use of insulin ([place])   [facility], Lilia Argue, MD   2 months ago Essential hypertension   [facility], Lilia Argue, MD   5 months ago Medicare annual wellness visit, subsequent   [facility], Lilia Argue, MD   5 months ago Vitamin D deficiency   [facility], Lilia Argue, MD   6 months ago Blurry vision, left eye   [facility] at [facility], Lilia Argue, MD  Future Appointments            In 3 months [place], Lilia Argue, MD Primary Care at [place], [facility]            Vitamin D, Ergocalciferol, (DRISDOL) 1.25 MG (50000 UT) CAPS capsule [Pharmacy Med Name: VIT D2 CAP 1250MCG (50,000U)] 12 capsule 0    Sig: TAKE 1 CAPSULE BY MOUTH  EVERY 7 DAYS     Endocrinology:  Vitamins - Vitamin D Supplementation Failed - 07/11/2019  9:36 AM      Failed - 50,000 IU strengths are not delegated      Failed - Ca in normal range and within 360 days    Calcium  Date Value Ref Range Status  04/18/2019 10.6 (H) 8.6 - 10.2 mg/dL Final         Failed - Phosphate in normal range and within 360 days    Phosphorus  Date Value Ref Range Status  12/20/2018 2.2 (L) 2.8 - 4.1 mg/dL Final         Passed - Vitamin D in normal range and within 360 days    VITD  Date Value Ref Range Status  12/29/2018 20.06 (L) 30.00 - 100.00 ng/mL Final   Vit D, 25-Hydroxy  Date Value Ref Range Status  04/18/2019 73.0 30.0 - 100.0 ng/mL Final    Comment:    Vitamin D deficiency has been defined by the Institute of Medicine and an Endocrine Society  practice guideline as a level of serum 25-OH vitamin D less than 20 ng/mL (1,2). The Endocrine Society went on to further define vitamin D insufficiency as a level between 21 and 29 ng/mL (2). 1. IOM (Institute of Medicine). 2010. Dietary reference    intakes for calcium and D. [place]: The    [employer]. 2. Holick MF, Binkley [place], Bischoff-Ferrari HA, et al.    Evaluation, treatment, and prevention of vitamin D    deficiency: an Endocrine Society clinical practice    guideline. JCEM. 2011 Jul; 96(7):1911-30.          Passed - Valid encounter within last 12 months    Recent Outpatient Visits          2 months ago Type 2 diabetes mellitus with complication, without long-term current use of insulin ([place])   [facility], Lilia Argue, MD   2 months ago Essential hypertension   [facility], Lilia Argue, MD   5 months ago Medicare annual wellness visit, subsequent   [facility], Lilia Argue, MD   5 months ago Vitamin D deficiency   [facility], Lilia Argue, MD   6 months ago Blurry vision, left eye   [facility], Lilia Argue, MD      Future Appointments            In 3 months Rutherford Guys, MD Primary Care at [place], [facility]            gabapentin (NEURONTIN) 100 MG capsule [Pharmacy Med Name: GABAPENTIN  100MG  CAP] 30 capsule 0    Sig: TAKE 1 TO 2 CAPSULES BY  MOUTH AT BEDTIME AS NEEDED.     Neurology: Anticonvulsants - gabapentin Passed - 07/11/2019  9:36 AM      Passed - Valid encounter within last 12 months    Recent Outpatient Visits          2 months ago Type 2 diabetes  mellitus with complication, without long-term current use of insulin [facility]   [facility], Lilia Argue, MD   2 months ago Essential hypertension   [facility], Lilia Argue, MD   5 months ago Medicare annual wellness visit, subsequent   [facility], Lilia Argue, MD   5  months ago Vitamin D deficiency   [facility], Lilia Argue, MD   6 months ago Blurry vision, left eye   [facility], Lilia Argue, MD      Future Appointments            In 3 months Rutherford Guys, MD Primary Care at [place], [facility]

## 2019-07-22 NOTE — Progress Notes (Signed)
Name: Theodore Frank  MRN/ DOB: [id number], [birth year]    Age/ Sex: 83 y.o., male     PCP: Rutherford Guys, MD   Reason for Endocrinology Evaluation: Hypercalcemia      Initial Endocrinology Clinic Visit: 12/29/18    PATIENT IDENTIFIER: Theodore Frank is a 83 y.o., male with a past medical history of HTN, Hx of colon and prostate cancer.. He has followed with [facility] since 12/29/2018 for consultative assistance with management of his Hypercalcemia.   HISTORICAL SUMMARY: The patient was first noted with hypercalcemia in 2013.  HCTZ stopped in 11/2018  His calcium/creatinine ratio in the 24-hr urine is 0.011 , this is equivocal as in [place] is it < 0.010 and if primary hyperparathyroidism it is > 0.020  SUBJECTIVE:    Today (07/25/2019):  Mr. [name] is here for follow up on hypercalcemia. Today he states compliance with vitamin D. He denies polyuria/polydispsia or constipation .  He denies renal stones.  He is on a low calcium diet.  Compliant with vitamin d  He is having left arm tingling and numbness, for the past 1-2 months, he is  This is exacerbated by weight lifting. Has left posterior neck tightness.   ROS:  As per HPI.   HISTORY:  Past Medical History:  Past Medical History:  Diagnosis Date  . Acute encephalopathy 04/23/2016  . Anemia   . Arthritis   . Cancer ([place]) 10/14/1999   colon cancer (surgery; no chemo; no radiation)  . Diabetes mellitus   . Hyperlipidemia   . Hypertension   . Prostate cancer ([place]) 10/13/1192   s/p prostatectomy  . Right inguinal hernia 02/25/2012  . Shortness of breath dyspnea    with exertion   Past Surgical History:  Past Surgical History:  Procedure Laterality Date  . COLON SURGERY  2001   colon resection for colon cancer  . HERNIA REPAIR Right   . INGUINAL HERNIA REPAIR  03/05/2012   Procedure: HERNIA REPAIR INGUINAL ADULT;  Surgeon: [name], MD;  Location: [facility];  Service: General;   Laterality: Right;  Repair right inguinal hernia with mesh  . LUMBAR LAMINECTOMY/DECOMPRESSION MICRODISCECTOMY Left 11/06/2014   Procedure: LEFT LUMBAR THREE-FOUR LAMINECTOMY/DECOMPRESSION MICRODISCECTOMY 1 LEVEL;  Surgeon: [name], MD;  Location: [facility];  Service: Neurosurgery;  Laterality: Left;  . PROSTATE SURGERY     prostatectomy for prostate cancer    Social History:  reports that he quit smoking about 53 years ago. He has never used smokeless tobacco. He reports that he does not drink alcohol or use drugs. Family History:  Family History  Problem Relation Age of Onset  . Hypertension Mother   . Thyroid disease Sister   . Diabetes Other      HOME MEDICATIONS: Allergies as of 07/25/2019   No Known Allergies     Medication List       Accurate as of July 25, 2019  9:23 AM. If you have any questions, ask your nurse or doctor.        amLODipine 5 MG tablet Commonly known as: NORVASC TAKE 1 TABLET BY MOUTH  DAILY   atorvastatin 20 MG tablet Commonly known as: LIPITOR TAKE 1 TABLET BY MOUTH  DAILY   gabapentin 100 MG capsule Commonly known as: NEURONTIN Take 1-2 capsules (100-200 mg total) by mouth at bedtime as needed.   lisinopril 20 MG tablet Commonly known as: ZESTRIL Take 1 tablet (20 mg total) by mouth daily.  metFORMIN 500 MG tablet Commonly known as: GLUCOPHAGE Take 1 tablet (500 mg total) by mouth 2 (two) times daily with a meal.   sildenafil 100 MG tablet Commonly known as: VIAGRA Take 0.5-1 tablets (50-100 mg total) by mouth daily as needed for erectile dysfunction.   sitaGLIPtin 100 MG tablet Commonly known as: Januvia Take 1 tablet (100 mg total) by mouth daily.   vitamin B-12 1000 MCG tablet Commonly known as: CYANOCOBALAMIN Take 1 tablet (1,000 mcg total) by mouth daily.         OBJECTIVE:   PHYSICAL EXAM: VS: BP 118/78 (BP Location: Left Arm, Patient Position: Sitting, Cuff Size: Normal)   Pulse 79   Temp 97.9 F (36.6  C)   Ht 6\' 1"  (1.854 m)   Wt 178 lb 6.4 oz (80.9 kg)   SpO2 96%   BMI 23.54 kg/m    EXAM: General: Pt appears well and is in NAD  Neck: General: Supple without adenopathy. Thyroid: Thyroid size normal.  No goiter or nodules appreciated. No thyroid bruit.  Lungs: Clear with good BS bilat with no rales, rhonchi, or wheezes  Heart: Auscultation: RRR.  Extremities:  BL LE: No pretibial edema normal ROM and strength.  Skin: Hair: Texture and amount normal with gender appropriate distribution Skin Inspection: No rashes. Skin Palpation: Skin temperature, texture, and thickness normal to palpation  Mental Status: Judgment, insight: Intact Orientation: Oriented to time, place, and person Mood and affect: No depression, anxiety, or agitation     DATA REVIEWED:  Results for PABLITO, PEPIN (MRN [id number]) as of 07/27/2019 08:13  Ref. Range 07/25/2019 08:01  Calcium Latest Ref Range: 8.6 - 10.3 mg/dL 10.4 (H)  Albumin Latest Ref Range: 3.5 - 5.2 g/dL 4.1  VITD Latest Ref Range: 30.00 - 100.00 ng/mL 29.90 (L)  PTH, Intact Latest Ref Range: 14 - 64 pg/mL 65 (H)   KUB 07/25/2019 NO evidence of renal stones    ASSESSMENT / PLAN / RECOMMENDATIONS:   1. PTH- Mediated Hypercalcemia :   - Pt is asymptomatic  - His calcium/creatinine ratio in the 24-hr urine is 0.011 , this is equivocal , as in [place] is it < 0.010 and if primary hyperparathyroidism it is > 0.020. This could indicate [place] vs low calcium diet.  - I have encouraged him again to consume 2-3 servings of calcium daily  - KUB shows no evidence of nephrolithiasis  - DXA- pending  - He was encouraged to stay hydrated  - Avoid OTC calcium tablets    2. Cervical Root impingement :   - His symptoms were reproducible with flexion and lateral rotation of the neck  - Pt has been using NSAID's , I have advised him to avoid NSAID's due to Ckd and to use tylenol PRN, he had already stopped the weight lifting.  - Pt to discuss this PCP if  symptoms don't resolve.     F/u in 6 months    Signed electronically by: Mack Guise, MD  [facility]  [facility] [address] [place], [place] [address] Phone: [phone] FAX: [phone]      CC: Rutherford Guys, MD [address] [place] [address] Phone: [phone]  Fax: [phone]   Return to Endocrinology clinic as below: Future Appointments  Date Time Provider [place]  10/17/2019  8:40 AM Rutherford Guys, MD PCP-PCP [facility]  01/23/2020  7:30 AM Shamleffer, Melanie Crazier, MD [facility] None

## 2019-07-25 ENCOUNTER — Encounter: Payer: Self-pay | Admitting: Internal Medicine

## 2019-07-25 ENCOUNTER — Other Ambulatory Visit: Payer: Self-pay

## 2019-07-25 ENCOUNTER — Ambulatory Visit
Admission: RE | Admit: 2019-07-25 | Discharge: 2019-07-25 | Disposition: A | Discharge status: Home / Self Care | Payer: Medicare Other | Source: Ambulatory Visit | Attending: Internal Medicine | Admitting: Internal Medicine

## 2019-07-25 ENCOUNTER — Ambulatory Visit (INDEPENDENT_AMBULATORY_CARE_PROVIDER_SITE_OTHER): Payer: Medicare Other | Admitting: Internal Medicine

## 2019-07-25 DIAGNOSIS — G542 Cervical root disorders, not elsewhere classified: Secondary | ICD-10-CM | POA: Insufficient documentation

## 2019-07-25 DIAGNOSIS — N200 Calculus of kidney: Secondary | ICD-10-CM | POA: Diagnosis not present

## 2019-07-25 DIAGNOSIS — E213 Hyperparathyroidism, unspecified: Secondary | ICD-10-CM | POA: Diagnosis not present

## 2019-07-25 LAB — ALBUMIN: Albumin: 4.1 g/dL (ref 3.5–5.2)

## 2019-07-25 LAB — VITAMIN D 25 HYDROXY (VIT D DEFICIENCY, FRACTURES): VITD: 29.9 ng/mL — ABNORMAL LOW (ref 30.00–100.00)

## 2019-07-25 NOTE — Patient Instructions (Addendum)
-   Please stay hydrated  - AVOID over the counter calcium, and avoid low calcium in the diet - Take Vitamin D as prescribed - Consume 2-3 servings of dairy a day   FOODS THAT CONTAIN CALCIUM  Calcium can be found in many foods, not only in dairy products.  Dairy Foods  Yogurt (1 cup) 350 mg  Milk (1 cup) 300 mg  Cheddar cheese (1 oz.) 204 mg  Ricotta cheese, part skim (1/4 cup) 169 mg  Cottage cheese (1 cup) 150 mg  Nondairy Foods  Whole Grain Total cereal (3/4 cup) 1000 mg  Pink salmon with bones, sardines (3 oz., cooked) 181 mg  Black beans (1 cup) 103 mg  Broccoli (1 cup, cooked) 150 mg  Almonds (1 tbsp.) 50 mg  Soy Products  Soy yogurt with calcium (3/4 cup) 300 mg  Soy milk enriched with calcium (1 cup) 300 mg  Tofu, firm or extra firm (1/4 cup) 250 mg  Soy nuts, roasted/salted (1/2 cup) 103 mg

## 2019-07-26 LAB — PTH, INTACT AND CALCIUM
Calcium: 10.4 mg/dL — ABNORMAL HIGH (ref 8.6–10.3)
PTH: 65 pg/mL — ABNORMAL HIGH (ref 14–64)

## 2019-07-27 ENCOUNTER — Encounter: Payer: Self-pay | Admitting: Internal Medicine

## 2019-08-01 ENCOUNTER — Ambulatory Visit (INDEPENDENT_AMBULATORY_CARE_PROVIDER_SITE_OTHER)
Admission: RE | Admit: 2019-08-01 | Discharge: 2019-08-01 | Disposition: A | Discharge status: Home / Self Care | Payer: Medicare Other | Source: Ambulatory Visit | Attending: Internal Medicine | Admitting: Internal Medicine

## 2019-08-01 ENCOUNTER — Other Ambulatory Visit: Payer: Self-pay

## 2019-08-01 DIAGNOSIS — E213 Hyperparathyroidism, unspecified: Secondary | ICD-10-CM | POA: Diagnosis not present

## 2019-08-15 ENCOUNTER — Encounter: Payer: Self-pay | Admitting: Internal Medicine

## 2019-08-17 ENCOUNTER — Other Ambulatory Visit: Payer: Self-pay | Admitting: Family Medicine

## 2019-08-17 DIAGNOSIS — E1122 Type 2 diabetes mellitus with diabetic chronic kidney disease: Secondary | ICD-10-CM

## 2019-08-29 ENCOUNTER — Other Ambulatory Visit: Payer: Self-pay | Admitting: Family Medicine

## 2019-09-15 ENCOUNTER — Other Ambulatory Visit: Payer: Self-pay | Admitting: Cardiology

## 2019-09-15 DIAGNOSIS — Z20822 Contact with and (suspected) exposure to covid-19: Secondary | ICD-10-CM

## 2019-09-18 LAB — NOVEL CORONAVIRUS, NAA: SARS-CoV-2, NAA: NOT DETECTED

## 2019-09-20 ENCOUNTER — Telehealth: Payer: Self-pay | Admitting: Family Medicine

## 2019-09-20 NOTE — Telephone Encounter (Signed)
Pt is aware covid 19 test is neg on 09/20/2019

## 2019-09-27 ENCOUNTER — Other Ambulatory Visit: Payer: Self-pay | Admitting: Family Medicine

## 2019-09-27 DIAGNOSIS — E7800 Pure hypercholesterolemia, unspecified: Secondary | ICD-10-CM

## 2019-09-28 NOTE — Telephone Encounter (Signed)
Requested Prescriptions  Pending Prescriptions Disp Refills  . atorvastatin (LIPITOR) 20 MG tablet [Pharmacy Med Name: ATORVASTATIN  20MG   TAB] 90 tablet 3    Sig: TAKE 1 TABLET BY MOUTH  DAILY     Cardiovascular:  Antilipid - Statins Failed - 09/27/2019  9:43 PM      Failed - Total Cholesterol in normal range and within 360 days    Cholesterol, Total  Date Value Ref Range Status  09/06/2018 170 100 - 199 mg/dL Final         Failed - LDL in normal range and within 360 days    LDL Calculated  Date Value Ref Range Status  09/06/2018 96 0 - 99 mg/dL Final         Failed - HDL in normal range and within 360 days    HDL  Date Value Ref Range Status  09/06/2018 55 >39 mg/dL Final         Failed - Triglycerides in normal range and within 360 days    Triglycerides  Date Value Ref Range Status  09/06/2018 93 0 - 149 mg/dL Final         Passed - Patient is not pregnant      Passed - Valid encounter within last 12 months    Recent Outpatient Visits          5 months ago Type 2 diabetes mellitus with complication, without long-term current use of insulin ([place])   [facility], Lilia Argue, MD   5 months ago Essential hypertension   [facility], Lilia Argue, MD   7 months ago Medicare annual wellness visit, subsequent   [facility], Lilia Argue, MD   8 months ago Vitamin D deficiency   [facility], Lilia Argue, MD   9 months ago Blurry vision, left eye   [facility], Lilia Argue, MD      Future Appointments            In 2 weeks Rutherford Guys, MD Primary Care at [place], [facility]

## 2019-10-17 ENCOUNTER — Other Ambulatory Visit: Payer: Self-pay

## 2019-10-17 ENCOUNTER — Encounter: Payer: Self-pay | Admitting: Family Medicine

## 2019-10-17 ENCOUNTER — Ambulatory Visit (INDEPENDENT_AMBULATORY_CARE_PROVIDER_SITE_OTHER): Payer: Medicare Other | Admitting: Family Medicine

## 2019-10-17 VITALS — BP 151/93 | HR 89 | Temp 97.8°F | Wt 174.2 lb

## 2019-10-17 DIAGNOSIS — I10 Essential (primary) hypertension: Secondary | ICD-10-CM

## 2019-10-17 DIAGNOSIS — E1122 Type 2 diabetes mellitus with diabetic chronic kidney disease: Secondary | ICD-10-CM | POA: Diagnosis not present

## 2019-10-17 DIAGNOSIS — Z23 Encounter for immunization: Secondary | ICD-10-CM

## 2019-10-17 DIAGNOSIS — E7800 Pure hypercholesterolemia, unspecified: Secondary | ICD-10-CM | POA: Diagnosis not present

## 2019-10-17 LAB — POCT GLYCOSYLATED HEMOGLOBIN (HGB A1C): Hemoglobin A1C: 6.7 % — AB (ref 4.0–5.6)

## 2019-10-17 MED ORDER — SITAGLIPTIN PHOSPHATE 100 MG PO TABS: 100.0000 mg | ORAL_TABLET | Freq: Every day | ORAL | 3 refills | Status: DC

## 2019-10-17 NOTE — Progress Notes (Signed)
1/4/20218:52 AM  Theodore Frank Aug 23, 1936, 84 y.o., male [id number]  Chief Complaint  Patient presents with  . Medical Management of Chronic Issues    Patient also need a refill on his [place].    HPI:   Patient is a 84 y.o. male with past medical history significant for HTN, DM2, CKD, HLP, prostate and colon cancer, hypercalcemia, vitamin D deficiency, basal cell carcinoma who presents today for routine follow-up  Last OV July 2020 Saw endo in oct 2020 for hypercalcemia- PTH mediated  Patient has been doing well since last OV He has no acute concerns today  He has not taken BP meds this morning - running late did not have time to have breakfast and therefore did not take his meds He does not check cbgs nor BP at home He has been working nights He denies any symptoms of hypoglycemia or hyperglycemia He does have nocturia, - chronic, does not interfere with his sleep He did have eye exam in early 2020 - got new glasses   Lab Results  Component Value Date   HGBA1C 5.3 04/18/2019   HGBA1C 8.2 (A) 11/26/2018   HGBA1C 7.5 (H) 10/01/2018   Lab Results  Component Value Date   MICROALBUR 0.72 03/21/2014   LDLCALC 96 09/06/2018   CREATININE 1.25 04/18/2019   Wt Readings from Last 3 Encounters:  10/17/19 174 lb 3.2 oz (79 kg)  07/25/19 178 lb 6.4 oz (80.9 kg)  04/22/19 175 lb 12.8 oz (79.7 kg)   BP Readings from Last 3 Encounters:  10/17/19 (!) 151/93  07/25/19 118/78  04/22/19 (!) 144/84    Depression screen PHQ 2/9 10/17/2019 04/21/2019 02/02/2019  Decreased Interest 0 0 0  Down, Depressed, Hopeless 0 0 0  PHQ - 2 Score 0 0 0    Fall Risk  10/17/2019 04/21/2019 02/02/2019 01/20/2019 12/27/2018  Falls in the past year? 0 0 0 0 0  Number falls in past yr: 0 - 0 0 0  Injury with Fall? 0 - 0 0 0  Follow up Falls evaluation completed Falls evaluation completed - Falls evaluation completed Falls evaluation completed     No Known Allergies  Prior to Admission medications     Medication Sig Start Date End Date Taking? Authorizing Provider  amLODipine (NORVASC) 5 MG tablet TAKE 1 TABLET BY MOUTH  DAILY 07/13/19  Yes Rutherford Guys, MD  atorvastatin (LIPITOR) 20 MG tablet TAKE 1 TABLET BY MOUTH  DAILY 09/28/19  Yes Rutherford Guys, MD  gabapentin (NEURONTIN) 100 MG capsule Take 1-2 capsules (100-200 mg total) by mouth at bedtime as needed. 01/20/19  Yes Rutherford Guys, MD  lisinopril (ZESTRIL) 20 MG tablet TAKE 1 TABLET BY MOUTH  DAILY 08/29/19  Yes Rutherford Guys, MD  metFORMIN (GLUCOPHAGE) 500 MG tablet TAKE 1 TABLET BY MOUTH  TWICE DAILY WITH A MEAL 08/17/19  Yes Rutherford Guys, MD  sildenafil (VIAGRA) 100 MG tablet Take 0.5-1 tablets (50-100 mg total) by mouth daily as needed for erectile dysfunction. 04/21/19  Yes Rutherford Guys, MD  sitaGLIPtin (JANUVIA) 100 MG tablet Take 1 tablet (100 mg total) by mouth daily. 04/21/19  Yes Rutherford Guys, MD  vitamin B-12 (CYANOCOBALAMIN) 1000 MCG tablet Take 1 tablet (1,000 mcg total) by mouth daily. 01/20/19  Yes Rutherford Guys, MD    Past Medical History:  Diagnosis Date  . Acute encephalopathy 04/23/2016  . Anemia   . Arthritis   . Cancer ([place]) 10/14/1999   colon  cancer (surgery; no chemo; no radiation)  . Diabetes mellitus   . Hyperlipidemia   . Hypertension   . Prostate cancer ([place]) 10/13/1192   s/p prostatectomy  . Right inguinal hernia 02/25/2012  . Shortness of breath dyspnea    with exertion    Past Surgical History:  Procedure Laterality Date  . COLON SURGERY  2001   colon resection for colon cancer  . HERNIA REPAIR Right   . INGUINAL HERNIA REPAIR  03/05/2012   Procedure: HERNIA REPAIR INGUINAL ADULT;  Surgeon: [name], MD;  Location: [facility];  Service: General;  Laterality: Right;  Repair right inguinal hernia with mesh  . LUMBAR LAMINECTOMY/DECOMPRESSION MICRODISCECTOMY Left 11/06/2014   Procedure: LEFT LUMBAR THREE-FOUR LAMINECTOMY/DECOMPRESSION MICRODISCECTOMY 1 LEVEL;   Surgeon: [name], MD;  Location: [facility];  Service: Neurosurgery;  Laterality: Left;  . PROSTATE SURGERY     prostatectomy for prostate cancer    Social History   Tobacco Use  . Smoking status: Former Smoker    Quit date: 10/13/1965    Years since quitting: 54.0  . Smokeless tobacco: Never Used  Substance Use Topics  . Alcohol use: No    Family History  Problem Relation Age of Onset  . Hypertension Mother   . Thyroid disease Sister   . Diabetes Other     Review of Systems  Constitutional: Negative for chills, fever, malaise/fatigue and weight loss.  Respiratory: Negative for cough and shortness of breath.   Cardiovascular: Negative for chest pain, palpitations and leg swelling.  Gastrointestinal: Negative for abdominal pain, blood in stool, constipation, diarrhea, melena, nausea and vomiting.  Genitourinary: Negative for dysuria and hematuria.  Neurological: Negative for dizziness, tingling and headaches.  Endo/Heme/Allergies: Negative for polydipsia.  Psychiatric/Behavioral: The patient does not have insomnia.      OBJECTIVE:  Today's Vitals   10/17/19 0849  BP: (!) 151/93  Pulse: 89  Temp: 97.8 F (36.6 C)  TempSrc: Oral  SpO2: 97%  Weight: 174 lb 3.2 oz (79 kg)   Body mass index is 22.98 kg/m.   Physical Exam Vitals and nursing note reviewed.  Constitutional:      Appearance: He is well-developed.  HENT:     Head: Normocephalic and atraumatic.  Eyes:     Conjunctiva/sclera: Conjunctivae normal.     Pupils: Pupils are equal, round, and reactive to light.  Cardiovascular:     Rate and Rhythm: Normal rate and regular rhythm.     Heart sounds: No murmur. No friction rub. No gallop.   Pulmonary:     Effort: Pulmonary effort is normal.     Breath sounds: Normal breath sounds. No wheezing or rales.  Musculoskeletal:     Cervical back: Neck supple.  Skin:    General: Skin is warm and dry.  Neurological:     Mental Status: He is alert and  oriented to person, place, and time.      Diabetic Foot Form - Detailed   Diabetic Foot Exam - detailed Diabetic Foot exam was performed with the following findings: Yes 10/17/2019  8:53 AM  Visual Foot Exam completed.: Yes  Can the patient see the bottom of their feet?: Yes Are the shoes appropriate in style and fit?: Yes Is there swelling or and abnormal foot shape?: No Is there a claw toe deformity?: No Is there elevated skin temparature?: No Is there foot or ankle muscle weakness?: No Normal Range of Motion: Yes Pulse Foot Exam completed.: Yes  Sensory Foot Exam Completed.: Yes Semmes-Weinstein Monofilament Test       Results for orders placed or performed in visit on 10/17/19 (from the past 24 hour(s))  POCT A1C     Status: Abnormal   Collection Time: 10/17/19  9:15 AM  Result Value Ref Range   Hemoglobin A1C 6.7 (A) 4.0 - 5.6 %   HbA1c POC (<> result, manual entry)     HbA1c, POC (prediabetic range)     HbA1c, POC (controlled diabetic range)      No results found.   ASSESSMENT and PLAN  1. Type 2 diabetes mellitus with chronic kidney disease, without long-term current use of insulin, unspecified CKD stage ([place]) Controlled. Continue current regime.  - CMET with GFR - HM DIABETES FOOT EXAM - POCT A1C - Microalbumin/Creatinine Ratio, Urine  2. Need for prophylactic vaccination and inoculation against influenza - Flu Vaccine QUAD High Dose(Fluad)  3. Essential hypertension Above goal in setting of not taking meds today. Return in 1-2 weeks for nurse BP check.  4. Pure hypercholesterolemia Checking labs today, medications will be adjusted as needed.  - Lipid panel  Other orders - sitaGLIPtin (JANUVIA) 100 MG tablet; Take 1 tablet (100 mg total) by mouth daily.  Return in about 6 months (around 04/15/2020).    Rutherford Guys, MD Primary Care at [facility] [place], [place] [address] Ph.  [phone] Fax [phone]

## 2019-10-17 NOTE — Patient Instructions (Addendum)
Please come back in about 2 weeks for nurse BP check, make sure that you have taken your medications that day    If you have lab work done today you will be contacted with your lab results within the next 2 weeks.  If you have not heard from [place] then please contact us. The fastest way to get your results is to register for My Chart.   IF you received an x-ray today, you will receive an invoice from [facility]. Please contact [facility] at [phone] with questions or concerns regarding your invoice.   IF you received labwork today, you will receive an invoice from [facility]. Please contact [facility] at [phone] with questions or concerns regarding your invoice.   Our billing staff will not be able to assist you with questions regarding bills from these companies.  You will be contacted with the lab results as soon as they are available. The fastest way to get your results is to activate your My Chart account. Instructions are located on the last page of this paperwork. If you have not heard from [place] regarding the results in 2 weeks, please contact this office.

## 2019-10-18 LAB — LIPID PANEL
Chol/HDL Ratio: 2.6 ratio (ref 0.0–5.0)
Cholesterol, Total: 133 mg/dL (ref 100–199)
HDL: 52 mg/dL (ref >39)
LDL Chol Calc (NIH): 67 mg/dL (ref 0–99)
Triglycerides: 69 mg/dL (ref 0–149)
VLDL Cholesterol Cal: 14 mg/dL (ref 5–40)

## 2019-10-18 LAB — CMP14+EGFR
ALT: 15 IU/L (ref 0–44)
AST: 18 IU/L (ref 0–40)
Albumin/Globulin Ratio: 1.6 (ref 1.2–2.2)
Albumin: 4.2 g/dL (ref 3.6–4.6)
Alkaline Phosphatase: 68 IU/L (ref 39–117)
BUN/Creatinine Ratio: 10 (ref 10–24)
BUN: 13 mg/dL (ref 8–27)
Bilirubin Total: 0.8 mg/dL (ref 0.0–1.2)
CO2: 22 mmol/L (ref 20–29)
Calcium: 10.5 mg/dL — ABNORMAL HIGH (ref 8.6–10.2)
Chloride: 106 mmol/L (ref 96–106)
Creatinine, Ser: 1.28 mg/dL — ABNORMAL HIGH (ref 0.76–1.27)
GFR calc Af Amer: 60 mL/min/{1.73_m2} (ref >59)
GFR calc non Af Amer: 52 mL/min/{1.73_m2} — ABNORMAL LOW (ref >59)
Globulin, Total: 2.7 g/dL (ref 1.5–4.5)
Glucose: 140 mg/dL — ABNORMAL HIGH (ref 65–99)
Potassium: 3.5 mmol/L (ref 3.5–5.2)
Sodium: 142 mmol/L (ref 134–144)
Total Protein: 6.9 g/dL (ref 6.0–8.5)

## 2019-10-18 LAB — MICROALBUMIN / CREATININE URINE RATIO
Creatinine, Urine: 96.1 mg/dL
Microalb/Creat Ratio: 18 mg/g creat (ref 0–29)
Microalbumin, Urine: 17.5 ug/mL

## 2019-10-21 ENCOUNTER — Encounter: Payer: Self-pay | Admitting: Radiology

## 2019-10-31 ENCOUNTER — Other Ambulatory Visit: Payer: Self-pay

## 2019-10-31 ENCOUNTER — Ambulatory Visit (INDEPENDENT_AMBULATORY_CARE_PROVIDER_SITE_OTHER): Payer: Medicare Other | Admitting: Family Medicine

## 2019-10-31 VITALS — BP 154/82 | HR 67 | Temp 97.5°F | Ht 73.0 in | Wt 180.4 lb

## 2019-10-31 DIAGNOSIS — Z0130 Encounter for examination of blood pressure without abnormal findings: Secondary | ICD-10-CM

## 2019-10-31 NOTE — Progress Notes (Signed)
Spoke to provider after recording the BP readings.   I have relayed the message from the provider to the pt. Pt was instructed to increase Norvasc to 10 mg daily and to f/u in a month for a BP f/u appt. Pt stated understanding and will call back for the appointment.

## 2019-11-28 ENCOUNTER — Encounter: Payer: Self-pay | Admitting: Family Medicine

## 2019-11-28 ENCOUNTER — Ambulatory Visit (INDEPENDENT_AMBULATORY_CARE_PROVIDER_SITE_OTHER): Payer: Medicare Other | Admitting: Family Medicine

## 2019-11-28 ENCOUNTER — Other Ambulatory Visit: Payer: Self-pay

## 2019-11-28 DIAGNOSIS — I10 Essential (primary) hypertension: Secondary | ICD-10-CM | POA: Diagnosis not present

## 2019-11-28 MED ORDER — SITAGLIPTIN PHOSPHATE 100 MG PO TABS: 100.0000 mg | ORAL_TABLET | Freq: Every day | ORAL | 3 refills | Status: DC

## 2019-11-28 MED ORDER — AMLODIPINE BESYLATE 10 MG PO TABS: 10.0000 mg | ORAL_TABLET | Freq: Every day | ORAL | 3 refills | Status: DC

## 2019-11-28 NOTE — Patient Instructions (Signed)
° ° ° °  If you have lab work done today you will be contacted with your lab results within the next 2 weeks.  If you have not heard from us then please contact us. The fastest way to get your results is to register for My Chart. ° ° °IF you received an x-ray today, you will receive an invoice from [facility]. Please contact [facility] at [phone] with questions or concerns regarding your invoice.  ° °IF you received labwork today, you will receive an invoice from [facility]. Please contact [facility] at [phone] with questions or concerns regarding your invoice.  ° °Our billing staff will not be able to assist you with questions regarding bills from these companies. ° °You will be contacted with the lab results as soon as they are available. The fastest way to get your results is to activate your My Chart account. Instructions are located on the last page of this paperwork. If you have not heard from us regarding the results in 2 weeks, please contact this office. °  ° ° ° °

## 2019-11-28 NOTE — Progress Notes (Signed)
2/15/202110:25 AM  Theodore Frank 09/01/1936, 84 y.o., male [id number]  Chief Complaint  Patient presents with  . Hypertension    chkn bp at home, number range in the 140's over 80's    HPI:   Patient is a 84 y.o. male with past medical history significant for HTN, DM2, CKD, HLP, prostate and colon cancer, hypercalcemia, vitamin D deficiency, basal cell carcinomawho presents today for routine follow-up  Last OV a month ago - elevated BP, return for nurse BP check still elevated, increased norvasc to 10mg   Tolerating 10mg  well denies any side effects Checking BP at home, as been coming down, similar to today  Needs refill of [place]  He does not think he will get covid vaccine  BP Readings from Last 3 Encounters:  11/28/19 140/80  10/31/19 (!) 154/82  10/17/19 (!) 151/93    Depression screen PHQ 2/9 11/28/2019 10/17/2019 04/21/2019  Decreased Interest 0 0 0  Down, Depressed, Hopeless 0 0 0  PHQ - 2 Score 0 0 0    Fall Risk  11/28/2019 10/17/2019 04/21/2019 02/02/2019 01/20/2019  Falls in the past year? 0 0 0 0 0  Number falls in past yr: 0 0 - 0 0  Injury with Fall? 0 0 - 0 0  Follow up - Falls evaluation completed Falls evaluation completed - Falls evaluation completed     No Known Allergies  Prior to Admission medications   Medication Sig Start Date End Date Taking? Authorizing Provider  amLODipine (NORVASC) 5 MG tablet TAKE 1 TABLET BY MOUTH  DAILY 07/13/19  Yes Rutherford Guys, MD  atorvastatin (LIPITOR) 20 MG tablet TAKE 1 TABLET BY MOUTH  DAILY 09/28/19  Yes Rutherford Guys, MD  gabapentin (NEURONTIN) 100 MG capsule Take 1-2 capsules (100-200 mg total) by mouth at bedtime as needed. 01/20/19  Yes Rutherford Guys, MD  lisinopril (ZESTRIL) 20 MG tablet TAKE 1 TABLET BY MOUTH  DAILY 08/29/19  Yes Rutherford Guys, MD  metFORMIN (GLUCOPHAGE) 500 MG tablet TAKE 1 TABLET BY MOUTH  TWICE DAILY WITH A MEAL 08/17/19  Yes Rutherford Guys, MD  sildenafil (VIAGRA) 100 MG tablet  Take 0.5-1 tablets (50-100 mg total) by mouth daily as needed for erectile dysfunction. 04/21/19  Yes Rutherford Guys, MD  sitaGLIPtin (JANUVIA) 100 MG tablet Take 1 tablet (100 mg total) by mouth daily. 10/17/19  Yes Rutherford Guys, MD  vitamin B-12 (CYANOCOBALAMIN) 1000 MCG tablet Take 1 tablet (1,000 mcg total) by mouth daily. 01/20/19  Yes Rutherford Guys, MD    Past Medical History:  Diagnosis Date  . Acute encephalopathy 04/23/2016  . Anemia   . Arthritis   . Cancer ([place]) 10/14/1999   colon cancer (surgery; no chemo; no radiation)  . Diabetes mellitus   . Hyperlipidemia   . Hypertension   . Prostate cancer ([place]) 10/13/1192   s/p prostatectomy  . Right inguinal hernia 02/25/2012  . Shortness of breath dyspnea    with exertion    Past Surgical History:  Procedure Laterality Date  . COLON SURGERY  2001   colon resection for colon cancer  . HERNIA REPAIR Right   . INGUINAL HERNIA REPAIR  03/05/2012   Procedure: HERNIA REPAIR INGUINAL ADULT;  Surgeon: [name], MD;  Location: [facility];  Service: General;  Laterality: Right;  Repair right inguinal hernia with mesh  . LUMBAR LAMINECTOMY/DECOMPRESSION MICRODISCECTOMY Left 11/06/2014   Procedure: LEFT LUMBAR THREE-FOUR LAMINECTOMY/DECOMPRESSION MICRODISCECTOMY 1 LEVEL;  Surgeon:  [name], MD;  Location: [facility];  Service: Neurosurgery;  Laterality: Left;  . PROSTATE SURGERY     prostatectomy for prostate cancer    Social History   Tobacco Use  . Smoking status: Former Smoker    Quit date: 10/13/1965    Years since quitting: 54.1  . Smokeless tobacco: Never Used  Substance Use Topics  . Alcohol use: No    Family History  Problem Relation Age of Onset  . Hypertension Mother   . Thyroid disease Sister   . Diabetes Other     Review of Systems  Constitutional: Negative for chills, fever and malaise/fatigue.  Respiratory: Negative for cough and shortness of breath.   Cardiovascular: Negative for  chest pain, palpitations and leg swelling.  Gastrointestinal: Negative for abdominal pain, nausea and vomiting.  Neurological: Negative for dizziness.     OBJECTIVE:  Today's Vitals   11/28/19 1019  BP: 140/80  Pulse: 92  Temp: 98.4 F (36.9 C)  SpO2: 98%  Weight: 176 lb (79.8 kg)  Height: 6\' 1"  (1.854 m)   Body mass index is 23.22 kg/m.   Physical Exam Vitals and nursing note reviewed.  Constitutional:      Appearance: He is well-developed.  HENT:     Head: Normocephalic and atraumatic.  Eyes:     Conjunctiva/sclera: Conjunctivae normal.     Pupils: Pupils are equal, round, and reactive to light.  Cardiovascular:     Rate and Rhythm: Normal rate and regular rhythm.     Heart sounds: No murmur. No friction rub. No gallop.   Pulmonary:     Effort: Pulmonary effort is normal.     Breath sounds: Normal breath sounds. No wheezing or rales.  Musculoskeletal:     Cervical back: Neck supple.     Right lower leg: No edema.     Left lower leg: No edema.  Skin:    General: Skin is warm and dry.  Neurological:     Mental Status: He is alert and oriented to person, place, and time.     No results found for this or any previous visit (from the past 24 hour(s)).  No results found.   ASSESSMENT and PLAN  1. Essential hypertension Controlled. Continue current regime.   Other orders - amLODipine (NORVASC) 10 MG tablet; Take 1 tablet (10 mg total) by mouth daily. - sitaGLIPtin (JANUVIA) 100 MG tablet; Take 1 tablet (100 mg total) by mouth daily.  Return in about 6 months (around 05/27/2020).    Rutherford Guys, MD [facility] [place], [place] [address] Ph.  [phone] Fax [phone]

## 2020-01-19 ENCOUNTER — Other Ambulatory Visit: Payer: Self-pay

## 2020-01-23 ENCOUNTER — Ambulatory Visit (INDEPENDENT_AMBULATORY_CARE_PROVIDER_SITE_OTHER): Payer: Medicare Other | Admitting: Internal Medicine

## 2020-01-23 ENCOUNTER — Other Ambulatory Visit: Payer: Self-pay

## 2020-01-23 ENCOUNTER — Encounter: Payer: Self-pay | Admitting: Internal Medicine

## 2020-01-23 VITALS — BP 134/80 | HR 78 | Temp 97.8°F | Ht 73.0 in | Wt 178.8 lb

## 2020-01-23 DIAGNOSIS — E213 Hyperparathyroidism, unspecified: Secondary | ICD-10-CM

## 2020-01-23 LAB — BASIC METABOLIC PANEL
BUN: 25 mg/dL — ABNORMAL HIGH (ref 6–23)
CO2: 28 mEq/L (ref 19–32)
Calcium: 11 mg/dL — ABNORMAL HIGH (ref 8.4–10.5)
Chloride: 105 mEq/L (ref 96–112)
Creatinine, Ser: 1.31 mg/dL (ref 0.40–1.50)
GFR: 63.42 mL/min (ref >60.00)
Glucose, Bld: 197 mg/dL — ABNORMAL HIGH (ref 70–99)
Potassium: 3.7 mEq/L (ref 3.5–5.1)
Sodium: 139 mEq/L (ref 135–145)

## 2020-01-23 LAB — VITAMIN D 25 HYDROXY (VIT D DEFICIENCY, FRACTURES): VITD: 41.75 ng/mL (ref 30.00–100.00)

## 2020-01-23 LAB — ALBUMIN: Albumin: 4 g/dL (ref 3.5–5.2)

## 2020-01-23 NOTE — Progress Notes (Signed)
Name: Theodore Frank Frank  MRN/ DOB: [id number], 08/12/1936    Age/ Sex: 84 y.o., male     PCP: Rutherford Guys, MD   Reason for Endocrinology Evaluation: Hypercalcemia      Initial Endocrinology Clinic Visit: 12/29/18    PATIENT IDENTIFIER: Theodore Frank Frank is a 84 y.o., male with a past medical history of HTN, Hx of colon and prostate cancer.. He has followed with [facility] since 12/29/2018 for consultative assistance with management of his Hypercalcemia.   HISTORICAL SUMMARY: The patient was first noted with hypercalcemia in 2013.  HCTZ stopped in 11/2018  His calcium/creatinine ratio in the 24-hr urine is 0.011 , this is equivocal as in [place] is it < 0.010 and if primary hyperparathyroidism it is > 0.020  SUBJECTIVE:   Today (01/23/2020):  Theodore Frank Frank is here for follow up on hypercalcemia. Today he states compliance with vitamin D. He denies polyuria/polydispsia or constipation .  He denies renal stones.  He is on a low calcium diet.  Compliant with vitamin d  ROS:  As per HPI.   HISTORY:  Past Medical History:  Past Medical History:  Diagnosis Date  . Acute encephalopathy 04/23/2016  . Anemia   . Arthritis   . Cancer ([facility]) 10/14/1999   colon cancer (surgery; no chemo; no radiation)  . Diabetes mellitus   . Hyperlipidemia   . Hypertension   . Prostate cancer ([facility]) 10/13/1192   s/p prostatectomy  . Right inguinal hernia 02/25/2012  . Shortness of breath dyspnea    with exertion   Past Surgical History:  Past Surgical History:  Procedure Laterality Date  . COLON SURGERY  2001   colon resection for colon cancer  . HERNIA REPAIR Right   . INGUINAL HERNIA REPAIR  03/05/2012   Procedure: HERNIA REPAIR INGUINAL ADULT;  Surgeon: [name], MD;  Location: [facility];  Service: General;  Laterality: Right;  Repair right inguinal hernia with mesh  . LUMBAR LAMINECTOMY/DECOMPRESSION MICRODISCECTOMY Left 11/06/2014   Procedure: LEFT LUMBAR  THREE-FOUR LAMINECTOMY/DECOMPRESSION MICRODISCECTOMY 1 LEVEL;  Surgeon: [name], MD;  Location: [facility];  Service: Neurosurgery;  Laterality: Left;  . PROSTATE SURGERY     prostatectomy for prostate cancer    Social History:  reports that he quit smoking about 54 years ago. He has never used smokeless tobacco. He reports that he does not drink alcohol or use drugs. Family History:  Family History  Problem Relation Age of Onset  . Hypertension Mother   . Thyroid disease Sister   . Diabetes Other      HOME MEDICATIONS: Allergies as of 01/23/2020   No Known Allergies     Medication List       Accurate as of January 23, 2020  9:14 AM. If you have any questions, ask your nurse or doctor.        amLODipine 10 MG tablet Commonly known as: NORVASC Take 1 tablet (10 mg total) by mouth daily.   atorvastatin 20 MG tablet Commonly known as: LIPITOR TAKE 1 TABLET BY MOUTH  DAILY   gabapentin 100 MG capsule Commonly known as: NEURONTIN Take 1-2 capsules (100-200 mg total) by mouth at bedtime as needed.   lisinopril 20 MG tablet Commonly known as: ZESTRIL TAKE 1 TABLET BY MOUTH  DAILY   metFORMIN 500 MG tablet Commonly known as: GLUCOPHAGE TAKE 1 TABLET BY MOUTH  TWICE DAILY WITH A MEAL   sildenafil 100 MG tablet Commonly known as:  VIAGRA Take 0.5-1 tablets (50-100 mg total) by mouth daily as needed for erectile dysfunction.   sitaGLIPtin 100 MG tablet Commonly known as: Januvia Take 1 tablet (100 mg total) by mouth daily.   vitamin B-12 1000 MCG tablet Commonly known as: CYANOCOBALAMIN Take 1 tablet (1,000 mcg total) by mouth daily.         OBJECTIVE:   PHYSICAL EXAM: VS: BP 134/80 (BP Location: Left Arm, Patient Position: Sitting, Cuff Size: Normal)   Pulse 78   Temp 97.8 F (36.6 C)   Ht 6\' 1"  (1.854 m)   Wt 178 lb 12.8 oz (81.1 kg)   SpO2 97%   BMI 23.59 kg/m    EXAM: General: Pt appears well and is in NAD  Neck: General: Supple without  adenopathy. Thyroid: Thyroid size normal.  No goiter or nodules appreciated. No thyroid bruit.  Lungs: Clear with good BS bilat with no rales, rhonchi, or wheezes  Heart: Auscultation: RRR.  Extremities:  BL LE: No pretibial edema normal ROM and strength.  Mental Status: Judgment, insight: Intact Orientation: Oriented to time, place, and person Mood and affect: No depression, anxiety, or agitation     DATA REVIEWED: Results for Theodore Frank, Frank (MRN [id number]) as of 01/24/2020 13:42  Ref. Range 01/23/2020 07:50  Sodium Latest Ref Range: 135 - 145 mEq/L 139  Potassium Latest Ref Range: 3.5 - 5.1 mEq/L 3.7  Chloride Latest Ref Range: 96 - 112 mEq/L 105  CO2 Latest Ref Range: 19 - 32 mEq/L 28  Glucose Latest Ref Range: 70 - 99 mg/dL 197 (H)  BUN Latest Ref Range: 6 - 23 mg/dL 25 (H)  Creatinine Latest Ref Range: 0.40 - 1.50 mg/dL 1.31  Calcium Latest Ref Range: 8.4 - 10.5 mg/dL 11.0 (H)  Albumin Latest Ref Range: 3.5 - 5.2 g/dL 4.0  GFR Latest Ref Range: >60.00 mL/min 63.42  VITD Latest Ref Range: 30.00 - 100.00 ng/mL 41.75  PTH, Intact Latest Ref Range: 14 - 64 pg/mL 53     KUB 07/25/2019 NO evidence of renal stones    DXA 08/01/2019) Osteopenia   ASSESSMENT / PLAN / RECOMMENDATIONS:   1. PTH- Mediated Hypercalcemia :   - Pt is asymptomatic  - His calcium/creatinine ratio in the 24-hr urine is 0.011 , this is equivocal , as in [place] is it < 0.010 and if primary hyperparathyroidism it is > 0.020. This could indicate [place] vs low calcium diet.  - I have encouraged him again to consume 2-3 servings of calcium daily  - KUB shows no evidence of nephrolithiasis  - DXA (08/01/2019)- Osteopenia - He was encouraged to stay hydrated  - Avoid OTC calcium tablets    F/u in 6 months    Signed electronically by: Mack Guise, MD  [facility]  [address] [place], [place] [address] Phone: [phone] FAX: [phone]       CC: Rutherford Guys, MD [address] [place] [address] Phone: [phone]  Fax: [phone]   Return to Endocrinology clinic as below: Future Appointments  Date Time Provider [place]  05/28/2020  8:20 AM Rutherford Guys, MD PCP-PCP [facility]  01/28/2021  7:30 AM Shamleffer, Melanie Crazier, MD [facility] None

## 2020-01-23 NOTE — Patient Instructions (Signed)
-   Please stay hydrated  - AVOID over the counter calcium, and avoid low calcium in the diet - Take over the counter vitamin D3 1000 iu daily  - Consume 2-3 servings of dairy a day     

## 2020-01-24 ENCOUNTER — Encounter: Payer: Self-pay | Admitting: Internal Medicine

## 2020-01-24 LAB — PARATHYROID HORMONE, INTACT (NO CA): PTH: 53 pg/mL (ref 14–64)

## 2020-02-11 ENCOUNTER — Ambulatory Visit: Payer: Medicare Other | Attending: Internal Medicine

## 2020-02-11 DIAGNOSIS — Z23 Encounter for immunization: Secondary | ICD-10-CM

## 2020-02-11 NOTE — Progress Notes (Signed)
   Covid-19 Vaccination Clinic  Name:  Maxwell Kirton    MRN: [id number] DOB: [birth year]  02/11/2020  Mr. Muecke was observed post Covid-19 immunization for 15 minutes without incident. He was provided with Vaccine Information Sheet and instruction to access the V-Safe system.   Mr. Mazzoli was instructed to call 911 with any severe reactions post vaccine: Marland Kitchen Difficulty breathing  . Swelling of face and throat  . A fast heartbeat  . A bad rash all over body  . Dizziness and weakness   Immunizations Administered    Name Date Dose VIS Date Route   Pfizer COVID-19 Vaccine 02/11/2020 10:50 AM 0.3 mL 12/07/2018 Intramuscular   Manufacturer: [place]   Lot: [id number]   [employer]: [id number]

## 2020-03-05 ENCOUNTER — Ambulatory Visit: Payer: Medicare Other | Attending: Internal Medicine

## 2020-03-05 ENCOUNTER — Ambulatory Visit (INDEPENDENT_AMBULATORY_CARE_PROVIDER_SITE_OTHER): Payer: Medicare Other | Admitting: Family Medicine

## 2020-03-05 VITALS — BP 134/80 | Ht 73.0 in | Wt 178.0 lb

## 2020-03-05 DIAGNOSIS — Z0000 Encounter for general adult medical examination without abnormal findings: Secondary | ICD-10-CM

## 2020-03-05 DIAGNOSIS — Z23 Encounter for immunization: Secondary | ICD-10-CM

## 2020-03-05 NOTE — Patient Instructions (Addendum)
Thank you for taking time to come for your Medicare Wellness Visit. I appreciate your ongoing commitment to your health goals. Please review the following plan we discussed and let me know if I can assist you in the future.  Theodore Kennedy LPN  Preventive Care 64 Years and Older, Male Preventive care refers to lifestyle choices and visits with your health care provider that can promote health and wellness. This includes:  A yearly physical exam. This is also called an annual well check.  Regular dental and eye exams.  Immunizations.  Screening for certain conditions.  Healthy lifestyle choices, such as diet and exercise. What can I expect for my preventive care visit? Physical exam Your health care provider will check:  Height and weight. These may be used to calculate body mass index (BMI), which is a measurement that tells if you are at a healthy weight.  Heart rate and blood pressure.  Your skin for abnormal spots. Counseling Your health care provider may ask you questions about:  Alcohol, tobacco, and drug use.  Emotional well-being.  Home and relationship well-being.  Sexual activity.  Eating habits.  History of falls.  Memory and ability to understand (cognition).  Work and work Statistician. What immunizations do I need?  Influenza (flu) vaccine  This is recommended every year. Tetanus, diphtheria, and pertussis (Tdap) vaccine  You may need a Td booster every 10 years. Varicella (chickenpox) vaccine  You may need this vaccine if you have not already been vaccinated. Zoster (shingles) vaccine  You may need this after age 66. Pneumococcal conjugate (PCV13) vaccine  One dose is recommended after age 84. Pneumococcal polysaccharide (PPSV23) vaccine  One dose is recommended after age 88. Measles, mumps, and rubella (MMR) vaccine  You may need at least one dose of MMR if you were born in 1957 or later. You may also need a second dose. Meningococcal  conjugate (MenACWY) vaccine  You may need this if you have certain conditions. Hepatitis A vaccine  You may need this if you have certain conditions or if you travel or work in places where you may be exposed to hepatitis A. Hepatitis B vaccine  You may need this if you have certain conditions or if you travel or work in places where you may be exposed to hepatitis B. Haemophilus influenzae type b (Hib) vaccine  You may need this if you have certain conditions. You may receive vaccines as individual doses or as more than one vaccine together in one shot (combination vaccines). Talk with your health care provider about the risks and benefits of combination vaccines. What tests do I need? Blood tests  Lipid and cholesterol levels. These may be checked every 5 years, or more frequently depending on your overall health.  Hepatitis C test.  Hepatitis B test. Screening  Lung cancer screening. You may have this screening every year starting at age 24 if you have a 30-pack-year history of smoking and currently smoke or have quit within the past 15 years.  Colorectal cancer screening. All adults should have this screening starting at age 52 and continuing until age 61. Your health care provider may recommend screening at age 57 if you are at increased risk. You will have tests every 1-10 years, depending on your results and the type of screening test.  Prostate cancer screening. Recommendations will vary depending on your family history and other risks.  Diabetes screening. This is done by checking your blood sugar (glucose) after you have not eaten for  a while (fasting). You may have this done every 1-3 years.  Abdominal aortic aneurysm (AAA) screening. You may need this if you are a current or former smoker.  Sexually transmitted disease (STD) testing. Follow these instructions at home: Eating and drinking  Eat a diet that includes fresh fruits and vegetables, whole grains, lean  protein, and low-fat dairy products. Limit your intake of foods with high amounts of sugar, saturated fats, and salt.  Take vitamin and mineral supplements as recommended by your health care provider.  Do not drink alcohol if your health care provider tells you not to drink.  If you drink alcohol: ? Limit how much you have to 0-2 drinks a day. ? Be aware of how much alcohol is in your drink. In the [place] one drink equals one 12 oz bottle of beer (355 mL), one 5 oz glass of wine (148 mL), or one 1 oz glass of hard liquor (44 mL). Lifestyle  Take daily care of your teeth and gums.  Stay active. Exercise for at least 30 minutes on 5 or more days each week.  Do not use any products that contain nicotine or tobacco, such as cigarettes, e-cigarettes, and chewing tobacco. If you need help quitting, ask your health care provider.  If you are sexually active, practice safe sex. Use a condom or other form of protection to prevent STIs (sexually transmitted infections).  Talk with your health care provider about taking a low-dose aspirin or statin. What's next?  Visit your health care provider once a year for a well check visit.  Ask your health care provider how often you should have your eyes and teeth checked.  Stay up to date on all vaccines. This information is not intended to replace advice given to you by your health care provider. Make sure you discuss any questions you have with your health care provider. Document Revised: 09/23/2018 Document Reviewed: 09/23/2018 Elsevier Patient Education  2020 Elsevier Inc.  

## 2020-03-05 NOTE — Progress Notes (Signed)
   Covid-19 Vaccination Clinic  Name:  Taevin Hlavac    MRN: [id number] DOB: [birth year]  03/05/2020  Mr. Mccaster was observed post Covid-19 immunization for 15 minutes without incident. He was provided with Vaccine Information Sheet and instruction to access the V-Safe system.   Mr. Raitt was instructed to call 911 with any severe reactions post vaccine: Marland Kitchen Difficulty breathing  . Swelling of face and throat  . A fast heartbeat  . A bad rash all over body  . Dizziness and weakness   Immunizations Administered    Name Date Dose VIS Date Route   Pfizer COVID-19 Vaccine 03/05/2020  8:44 AM 0.3 mL 12/07/2018 Intramuscular   Manufacturer: [place]   Lot: [id number]   [place]: [id number]

## 2020-03-05 NOTE — Progress Notes (Signed)
Presents today for [employer] Visit   Date of last exam: 11/28/2019  Interpreter used for this visit?  No  I connected with  Theodore Frank on 03/05/20 by a telephone  and verified that I am speaking with the correct person using two identifiers.   I discussed the limitations of evaluation and management by telemedicine. The patient expressed understanding and agreed to proceed.    Patient Care Team: Rutherford Guys, MD as PCP - General (Family Medicine) [name], MD as Consulting Physician (Gastroenterology)   Other items to address today:   Discussed Eye/Dental Will call to schedule Eye appointment Discussed immunizations Follow up 8/16 @ 8:20 Dr. [name]   Other Screening: Last screening for diabetes: 04-18-2019 Last lipid screening: 10-17-2019  ADVANCE DIRECTIVES: Discussed: yes On File: no Materials Provided: yes  Immunization status:  Immunization History  Administered Date(s) Administered  . Fluad Quad(high Dose 65+) 10/17/2019  . Influenza, High Dose Seasonal PF 09/06/2018  . Influenza,inj,Quad PF,6+ Mos 06/27/2015, 06/13/2016, 08/06/2017  . PFIZER SARS-COV-2 Vaccination 02/11/2020, 03/05/2020  . Pneumococcal Conjugate-13 11/13/2016  . Pneumococcal Polysaccharide-23 11/17/2017     Health Maintenance Due  Topic Date Due  . OPHTHALMOLOGY EXAM  11/27/2019     Functional Status Survey: Is the patient deaf or have difficulty hearing?: No Does the patient have difficulty seeing, even when wearing glasses/contacts?: No Does the patient have difficulty concentrating, remembering, or making decisions?: No Does the patient have difficulty walking or climbing stairs?: No Does the patient have difficulty dressing or bathing?: No Does the patient have difficulty doing errands alone such as visiting a doctor's office or shopping?: No   6CIT Screen 03/05/2020 02/02/2019 11/17/2017  What Year? 0 points 0 points 0 points  What month? 0 points 0  points 0 points  What time? 0 points 0 points 0 points  Count back from 20 0 points 0 points 0 points  Months in reverse 0 points 0 points 0 points  Repeat phrase 0 points 0 points 0 points  Total Score 0 0 0        Office Visit from 02/02/2019 in [facility]  AUDIT-C Score  0       Home Environment:   Live one family home Still working part-time/ drive trucks No scattered rugs No grab bars  Walk in shower No trouble climbing stairs Adequate lighting/no clutter   Patient Active Problem List   Diagnosis Date Noted  . Cervical nerve root impingement 07/25/2019  . Hyperparathyroidism ([name]) 07/25/2019  . Hypercalcemia 12/29/2018  . Vitamin D deficiency 12/27/2018  . COPD (chronic obstructive pulmonary disease) ([place]) 02/23/2018  . Dilation of thoracic aorta ([name]) 02/23/2018  . History of colon cancer 11/13/2016  . Anemia 05/06/2016  . Arthritis 05/06/2016  . Hypoglycemia 04/23/2016  . Chronic kidney disease, stage 3 04/23/2016  . HNP (herniated nucleus pulposus), lumbar 11/06/2014  . History of prostate cancer 04/05/2013  . S/P right inguinal hernia repair 04/02/2012  . Hypertension 01/30/2012  . Hyperthyroidism 01/30/2012  . Type 2 diabetes mellitus with complication, without long-term current use of insulin ([name]) 01/30/2012  . Hyperlipidemia 01/30/2012     Past Medical History:  Diagnosis Date  . Acute encephalopathy 04/23/2016  . Anemia   . Arthritis   . Cancer ([name]) 10/14/1999   colon cancer (surgery; no chemo; no radiation)  . Diabetes mellitus   . Hyperlipidemia   . Hypertension   . Prostate cancer ([name]) 10/13/1192   s/p  prostatectomy  . Right inguinal hernia 02/25/2012  . Shortness of breath dyspnea    with exertion     Past Surgical History:  Procedure Laterality Date  . COLON SURGERY  2001   colon resection for colon cancer  . HERNIA REPAIR Right   . INGUINAL HERNIA REPAIR  03/05/2012   Procedure: HERNIA REPAIR INGUINAL ADULT;  Surgeon:  [name], MD;  Location: [facility];  Service: General;  Laterality: Right;  Repair right inguinal hernia with mesh  . LUMBAR LAMINECTOMY/DECOMPRESSION MICRODISCECTOMY Left 11/06/2014   Procedure: LEFT LUMBAR THREE-FOUR LAMINECTOMY/DECOMPRESSION MICRODISCECTOMY 1 LEVEL;  Surgeon: [name], MD;  Location: [facility];  Service: Neurosurgery;  Laterality: Left;  . PROSTATE SURGERY     prostatectomy for prostate cancer     Family History  Problem Relation Age of Onset  . Hypertension Mother   . Thyroid disease Sister   . Diabetes Other      Social History   Socioeconomic History  . Marital status: Divorced    Spouse name: Not on file  . Number of children: 4  . Years of education: 66  . Highest education level: Not on file  Occupational History  . Occupation: Training and development officer  Tobacco Use  . Smoking status: Former Smoker    Quit date: 10/13/1965    Years since quitting: 54.4  . Smokeless tobacco: Never Used  Substance and Sexual Activity  . Alcohol use: No  . Drug use: No  . Sexual activity: Yes    Birth control/protection: None  Other Topics Concern  . Not on file  Social History Narrative   Marital status: divorced; single in 2018; not dating      Children: 4 total children; 1 son died of AIDS; 3 living children       Live:Lives alone      Employment: truck Geophysicist/field seismologist; Estate manager/land agent; Building surveyor; local      Tobacco: previous smoker; quit in 1967      Alcohol: none      Exercise: none; scared to exercise      ADLs: independent with ADLs; drives      Advanced Directives:  None; desires FULL CODE         Right-handed   Only occasional coffee or hot tea   Social Determinants of Health   Financial Resource Strain:   . Difficulty of Paying Living Expenses:   Food Insecurity:   . Worried About Charity fundraiser in the Last Year:   . Arboriculturist in the Last Year:   Transportation Needs:   . Film/video editor (Medical):   Marland Kitchen Lack of  Transportation (Non-Medical):   Physical Activity:   . Days of Exercise per Week:   . Minutes of Exercise per Session:   Stress:   . Feeling of Stress :   Social Connections:   . Frequency of Communication with Friends and Family:   . Frequency of Social Gatherings with Friends and Family:   . Attends Religious Services:   . Active Member of Clubs or Organizations:   . Attends Archivist Meetings:   Marland Kitchen Marital Status:   Intimate Partner Violence:   . Fear of Current or Ex-Partner:   . Emotionally Abused:   Marland Kitchen Physically Abused:   . Sexually Abused:      No Known Allergies   Prior to Admission medications   Medication Sig Start Date End Date Taking? Authorizing Provider  amLODipine (  NORVASC) 10 MG tablet Take 1 tablet (10 mg total) by mouth daily. 11/28/19  Yes Rutherford Guys, MD  atorvastatin (LIPITOR) 20 MG tablet TAKE 1 TABLET BY MOUTH  DAILY 09/28/19  Yes Rutherford Guys, MD  gabapentin (NEURONTIN) 100 MG capsule Take 1-2 capsules (100-200 mg total) by mouth at bedtime as needed. 01/20/19  Yes Rutherford Guys, MD  lisinopril (ZESTRIL) 20 MG tablet TAKE 1 TABLET BY MOUTH  DAILY 08/29/19  Yes Rutherford Guys, MD  metFORMIN (GLUCOPHAGE) 500 MG tablet TAKE 1 TABLET BY MOUTH  TWICE DAILY WITH A MEAL 08/17/19  Yes Rutherford Guys, MD  sitaGLIPtin (JANUVIA) 100 MG tablet Take 1 tablet (100 mg total) by mouth daily. 11/28/19  Yes Rutherford Guys, MD  vitamin B-12 (CYANOCOBALAMIN) 1000 MCG tablet Take 1 tablet (1,000 mcg total) by mouth daily. 01/20/19  Yes Rutherford Guys, MD  sildenafil (VIAGRA) 100 MG tablet Take 0.5-1 tablets (50-100 mg total) by mouth daily as needed for erectile dysfunction. Patient not taking: Reported on 01/23/2020 04/21/19   Rutherford Guys, MD     Depression screen [facility] 2/9 03/05/2020 11/28/2019 10/17/2019 04/21/2019 02/02/2019  Decreased Interest 0 0 0 0 0  Down, Depressed, Hopeless 0 0 0 0 0  PHQ - 2 Score 0 0 0 0 0     Fall Risk  03/05/2020 11/28/2019  10/17/2019 04/21/2019 02/02/2019  Falls in the past year? 0 0 0 0 0  Number falls in past yr: 0 0 0 - 0  Injury with Fall? 0 0 0 - 0  Follow up Falls evaluation completed;Education provided - Falls evaluation completed Falls evaluation completed -      PHYSICAL EXAM: BP 134/80 Comment: not in clinic  Ht 6\' 1"  (1.854 m)   Wt 178 lb (80.7 kg)   BMI 23.48 kg/m    Wt Readings from Last 3 Encounters:  03/05/20 178 lb (80.7 kg)  01/23/20 178 lb 12.8 oz (81.1 kg)  11/28/19 176 lb (79.8 kg)    Medicare annual wellness visit, subsequent     Education/Counseling provided regarding diet and exercise, prevention of chronic diseases, smoking/tobacco cessation, if applicable, and reviewed "Covered Medicare Preventive Services."

## 2020-04-17 ENCOUNTER — Ambulatory Visit: Payer: Medicare Other | Admitting: Family Medicine

## 2020-05-28 ENCOUNTER — Ambulatory Visit (INDEPENDENT_AMBULATORY_CARE_PROVIDER_SITE_OTHER): Payer: Medicare Other | Admitting: Family Medicine

## 2020-05-28 ENCOUNTER — Other Ambulatory Visit: Payer: Self-pay

## 2020-05-28 ENCOUNTER — Encounter: Payer: Self-pay | Admitting: Family Medicine

## 2020-05-28 VITALS — BP 157/75 | HR 67 | Temp 97.4°F | Ht 73.0 in | Wt 170.0 lb

## 2020-05-28 DIAGNOSIS — E7800 Pure hypercholesterolemia, unspecified: Secondary | ICD-10-CM | POA: Diagnosis not present

## 2020-05-28 DIAGNOSIS — E1122 Type 2 diabetes mellitus with diabetic chronic kidney disease: Secondary | ICD-10-CM

## 2020-05-28 DIAGNOSIS — I10 Essential (primary) hypertension: Secondary | ICD-10-CM | POA: Diagnosis not present

## 2020-05-28 MED ORDER — AMLODIPINE BESYLATE 10 MG PO TABS: 10.0000 mg | ORAL_TABLET | Freq: Every day | ORAL | 3 refills | Status: DC

## 2020-05-28 MED ORDER — ATORVASTATIN CALCIUM 20 MG PO TABS: 20.0000 mg | ORAL_TABLET | Freq: Every day | ORAL | 3 refills | Status: DC

## 2020-05-28 MED ORDER — SITAGLIPTIN PHOSPHATE 100 MG PO TABS: 100.0000 mg | ORAL_TABLET | Freq: Every day | ORAL | 3 refills | Status: DC

## 2020-05-28 NOTE — Progress Notes (Signed)
8/16/20218:31 AM  Theodore Frank 05-30-1936, 84 y.o., male [id number]  Chief Complaint  Patient presents with  . Hypertension    has not taken meds since yesterday  . Diabetes  . Medication Refill    HPI:   Patient is a 84 y.o. male with past medical history significant for HTN, DM2, CKD, HLP, prostate and colon cancer, hypercalcemia, vitamin D deficiency, basal cell carcinoma who presents today for routine followup  Last OV feb 2021 Saw endo April 2021 for hypercalcemia - FHH vs low calcium diet, increase dietary sources (not supplemental sources) of calcium  He is overall doing well He has no acute concerns today Ran out of BP meds couple days ago Not very good eater - struggling with maintaining weight He denies any sx of hypoglycemia He reports taking his meds as prescribed Has completed covid vaccine   Lab Results  Component Value Date   HGBA1C 6.7 (A) 10/17/2019   HGBA1C 5.3 04/18/2019   HGBA1C 8.2 (A) 11/26/2018   Lab Results  Component Value Date   MICROALBUR 0.72 03/21/2014   LDLCALC 67 10/17/2019   CREATININE 1.31 01/23/2020   Wt Readings from Last 3 Encounters:  05/28/20 170 lb (77.1 kg)  03/05/20 178 lb (80.7 kg)  01/23/20 178 lb 12.8 oz (81.1 kg)    Depression screen [facility] 2/9 05/28/2020 03/05/2020 11/28/2019  Decreased Interest 0 0 0  Down, Depressed, Hopeless 0 0 0  PHQ - 2 Score 0 0 0    Fall Risk  05/28/2020 03/05/2020 11/28/2019 10/17/2019 04/21/2019  Falls in the past year? 0 0 0 0 0  Number falls in past yr: 0 0 0 0 -  Injury with Fall? 0 0 0 0 -  Follow up - Falls evaluation completed;Education provided - Falls evaluation completed Falls evaluation completed     No Known Allergies  Prior to Admission medications   Medication Sig Start Date End Date Taking? Authorizing Provider  amLODipine (NORVASC) 10 MG tablet Take 1 tablet (10 mg total) by mouth daily. 11/28/19  Yes Rutherford Guys, MD  atorvastatin (LIPITOR) 20 MG tablet TAKE 1 TABLET BY  MOUTH  DAILY 09/28/19  Yes Rutherford Guys, MD  gabapentin (NEURONTIN) 100 MG capsule Take 1-2 capsules (100-200 mg total) by mouth at bedtime as needed. 01/20/19  Yes Rutherford Guys, MD  lisinopril (ZESTRIL) 20 MG tablet TAKE 1 TABLET BY MOUTH  DAILY 08/29/19  Yes Rutherford Guys, MD  metFORMIN (GLUCOPHAGE) 500 MG tablet TAKE 1 TABLET BY MOUTH  TWICE DAILY WITH A MEAL 08/17/19  Yes Rutherford Guys, MD  sitaGLIPtin (JANUVIA) 100 MG tablet Take 1 tablet (100 mg total) by mouth daily. 11/28/19  Yes Rutherford Guys, MD  vitamin B-12 (CYANOCOBALAMIN) 1000 MCG tablet Take 1 tablet (1,000 mcg total) by mouth daily. 01/20/19  Yes Rutherford Guys, MD  sildenafil (VIAGRA) 100 MG tablet Take 0.5-1 tablets (50-100 mg total) by mouth daily as needed for erectile dysfunction. Patient not taking: Reported on 05/28/2020 04/21/19   Rutherford Guys, MD    Past Medical History:  Diagnosis Date  . Acute encephalopathy 04/23/2016  . Anemia   . Arthritis   . Cancer ([place]) 10/14/1999   colon cancer (surgery; no chemo; no radiation)  . Diabetes mellitus   . Hyperlipidemia   . Hypertension   . Prostate cancer ([place]) 10/13/1192   s/p prostatectomy  . Right inguinal hernia 02/25/2012  . Shortness of breath dyspnea    with exertion  Past Surgical History:  Procedure Laterality Date  . COLON SURGERY  2001   colon resection for colon cancer  . HERNIA REPAIR Right   . INGUINAL HERNIA REPAIR  03/05/2012   Procedure: HERNIA REPAIR INGUINAL ADULT;  Surgeon: [name], MD;  Location: [facility];  Service: General;  Laterality: Right;  Repair right inguinal hernia with mesh  . LUMBAR LAMINECTOMY/DECOMPRESSION MICRODISCECTOMY Left 11/06/2014   Procedure: LEFT LUMBAR THREE-FOUR LAMINECTOMY/DECOMPRESSION MICRODISCECTOMY 1 LEVEL;  Surgeon: [name], MD;  Location: [facility];  Service: Neurosurgery;  Laterality: Left;  . PROSTATE SURGERY     prostatectomy for prostate cancer    Social History    Tobacco Use  . Smoking status: Former Smoker    Quit date: 10/13/1965    Years since quitting: 54.6  . Smokeless tobacco: Never Used  Substance Use Topics  . Alcohol use: No    Family History  Problem Relation Age of Onset  . Hypertension Mother   . Thyroid disease Sister   . Diabetes Other     Review of Systems  Constitutional: Negative for chills, diaphoresis, fever and malaise/fatigue.  Respiratory: Negative for cough and shortness of breath.   Cardiovascular: Negative for chest pain, palpitations and leg swelling.  Gastrointestinal: Negative for abdominal pain, blood in stool, constipation, diarrhea, melena, nausea and vomiting.  Genitourinary: Positive for frequency (4-5 x nocturia). Negative for dysuria, hematuria and urgency.  Neurological: Negative for dizziness and headaches.  Endo/Heme/Allergies: Negative for polydipsia.     OBJECTIVE:  Today's Vitals   05/28/20 0821  BP: (!) 157/75  Pulse: 67  Temp: (!) 97.4 F (36.3 C)  SpO2: 100%  Weight: 170 lb (77.1 kg)  Height: 6' 1"  (1.854 m)   Body mass index is 22.43 kg/m.  BP Readings from Last 3 Encounters:  05/28/20 (!) 157/75  03/05/20 134/80  01/23/20 134/80    Physical Exam Vitals and nursing note reviewed.  Constitutional:      Appearance: He is well-developed.  HENT:     Head: Normocephalic and atraumatic.  Eyes:     Extraocular Movements: Extraocular movements intact.     Conjunctiva/sclera: Conjunctivae normal.     Pupils: Pupils are equal, round, and reactive to light.  Cardiovascular:     Rate and Rhythm: Normal rate and regular rhythm.     Heart sounds: No murmur heard.  No friction rub. No gallop.   Pulmonary:     Effort: Pulmonary effort is normal.     Breath sounds: Normal breath sounds. No wheezing, rhonchi or rales.  Musculoskeletal:     Cervical back: Neck supple.     Right lower leg: No edema.     Left lower leg: No edema.  Skin:    General: Skin is warm and dry.   Neurological:     Mental Status: He is alert and oriented to person, place, and time.     No results found for this or any previous visit (from the past 24 hour(s)).  No results found.   ASSESSMENT and PLAN  1. Pure hypercholesterolemia Checking labs today, medications will be adjusted as needed.  - Lipid panel - CMP14+EGFR - atorvastatin (LIPITOR) 20 MG tablet; Take 1 tablet (20 mg total) by mouth daily.  2. Essential hypertension Above goal in setting of being off meds, amlodipine refilled. Previously at goal. - Lipid panel - CMP14+EGFR - amLODipine (NORVASC) 10 MG tablet; Take 1 tablet (10 mg total) by mouth daily.  3. Type  2 diabetes mellitus with chronic kidney disease, without long-term current use of insulin, unspecified CKD stage ([place]) Checking labs today, medications will be adjusted as needed.  - Hemoglobin A1c - sitaGLIPtin (JANUVIA) 100 MG tablet; Take 1 tablet (100 mg total) by mouth daily.  Return in about 6 months (around 11/28/2020).    Rutherford Guys, MD [facility] [place], [place] [address] Ph.  [phone] Fax [phone]

## 2020-05-28 NOTE — Patient Instructions (Addendum)
Eat 2-3 servings of calcium a day   If you have lab work done today you will be contacted with your lab results within the next 2 weeks.  If you have not heard from [place] then please contact us. The fastest way to get your results is to register for My Chart.   IF you received an x-ray today, you will receive an invoice from [facility]. Please contact [facility] at [phone] with questions or concerns regarding your invoice.   IF you received labwork today, you will receive an invoice from [facility]. Please contact [facility] at [phone] with questions or concerns regarding your invoice.   Our billing staff will not be able to assist you with questions regarding bills from these companies.  You will be contacted with the lab results as soon as they are available. The fastest way to get your results is to activate your My Chart account. Instructions are located on the last page of this paperwork. If you have not heard from [place] regarding the results in 2 weeks, please contact this office.     Calcium Content in Foods Calcium is the most abundant mineral in your body. Most of your body's calcium supply is stored in your bones and teeth. Calcium helps many parts of the body function normally, including:  Blood and blood vessels.  Nerves.  Hormones.  Muscles.  Bones and teeth. When your calcium stores are low, you may be at risk for low bone mass, bone loss, and broken bones (fractures). When you get enough calcium, it helps to support strong bones and teeth throughout your life. Calcium is especially important for:  Children during growth spurts.  Girls during adolescence.  Women who are pregnant or breastfeeding.  Women after their menstrual cycle stops (postmenopause).  Women whose menstrual cycle has stopped due to anorexia nervosa or regular intense exercise.  People who cannot eat or digest dairy products.  Vegans. What are tips for getting more  calcium? General information  Try to get most of your calcium from food. Eat foods that are high in calcium.  Some people may benefit from taking calcium supplements. Check with your health care provider or diet and nutrition specialist (dietitian) before starting any calcium supplements. Calcium supplements may interact with certain medicines. Too much calcium may cause other health problems, like constipation and kidney stones.  For the body to absorb calcium, it needs vitamin D. Sources of vitamin D include: ? Skin exposure to direct sunlight. ? Foods, such as egg yolks, liver, saltwater fish, and fortified milk. ? Vitamin D supplements. Check with your health care provider or dietitian before starting any vitamin D supplements. What foods are high in calcium?  High-calcium foods are those that contain more than 100 milligrams (mg) of calcium per serving. Fruits  Fortified orange or other fruit juice, 300 mg per 8 oz serving. Vegetables  Collard greens, 360 mg per 8 oz serving.  Kale, 180 mg per 8 oz serving.  Bok choy, 160 mg per 8 oz serving. Grains  Fortified ready-to-eat cereals, 100-1,000 mg per 8 oz serving.  Fortified frozen waffles, 200 mg in two waffles. Meats and other proteins  Sardines, canned with bones, 325 mg per 3 oz serving.  Salmon, canned with bones, 180 mg per 3 oz serving.  Canned shrimp, 125 mg per 3 oz serving.  Baked beans, 160 mg per 4 oz serving. Dairy  Yogurt, plain, low-fat, 310 mg per 6 oz serving.  Milk, 300 mg per 8  oz serving.  American cheese, 195 mg per 1 oz serving.  Cheddar cheese, 205 mg per 1 oz serving.  Cottage cheese 2%, 105 mg per 4 oz serving.  Fortified soy, rice, or almond milk, 300 mg per 8 oz serving. The items listed above may not be a complete list of foods high in calcium. Actual amounts of calcium may be different depending on processing. Contact a dietitian for more information. What foods are lower in  calcium? Foods lower in calcium are those that contain 50 mg of calcium or less per serving. Fruits  Apple, about 6 mg in one apple.  Banana, about 12 mg in one banana. Vegetables  Lettuce, 19 mg per 2 oz serving.  Tomato, about 11 mg in one tomato. Grains  Rice, 4 mg per 6 oz serving.  Boiled potatoes, 14 mg per 8 oz serving.  White bread, 6 mg in one slice. Meats and other proteins  Egg, 27 mg per 2 oz serving.  Red meat, 7 mg per 4 oz serving.  Chicken, 17 mg per 4 oz serving.  Fish, cod or trout, 20 mg per 4 oz serving. The items listed above may not be a complete list of foods lower in calcium. Actual amounts of calcium may be different depending on processing. Contact a dietitian for more information. Summary  Calcium is an important mineral in the body because it affects many functions. Getting enough calcium helps support strong bones and teeth throughout your life.  Try to get most of your calcium from food.  Calcium supplements may interact with certain medicines. Check with your health care provider before starting any calcium supplements. This information is not intended to replace advice given to you by your health care provider. Make sure you discuss any questions you have with your health care provider. Document Revised: 09/22/2017 Document Reviewed: 09/22/2017 Elsevier Patient Education  2020 [employer].

## 2020-05-29 LAB — CMP14+EGFR
ALT: 15 IU/L (ref 0–44)
AST: 19 IU/L (ref 0–40)
Albumin/Globulin Ratio: 1.5 (ref 1.2–2.2)
Albumin: 4.1 g/dL (ref 3.6–4.6)
Alkaline Phosphatase: 56 IU/L (ref 48–121)
BUN/Creatinine Ratio: 14 (ref 10–24)
BUN: 17 mg/dL (ref 8–27)
Bilirubin Total: 0.5 mg/dL (ref 0.0–1.2)
CO2: 24 mmol/L (ref 20–29)
Calcium: 10.5 mg/dL — ABNORMAL HIGH (ref 8.6–10.2)
Chloride: 108 mmol/L — ABNORMAL HIGH (ref 96–106)
Creatinine, Ser: 1.23 mg/dL (ref 0.76–1.27)
GFR calc Af Amer: 63 mL/min/{1.73_m2} (ref >59)
GFR calc non Af Amer: 54 mL/min/{1.73_m2} — ABNORMAL LOW (ref >59)
Globulin, Total: 2.8 g/dL (ref 1.5–4.5)
Glucose: 115 mg/dL — ABNORMAL HIGH (ref 65–99)
Potassium: 3.7 mmol/L (ref 3.5–5.2)
Sodium: 142 mmol/L (ref 134–144)
Total Protein: 6.9 g/dL (ref 6.0–8.5)

## 2020-05-29 LAB — LIPID PANEL
Chol/HDL Ratio: 2.4 ratio (ref 0.0–5.0)
Cholesterol, Total: 137 mg/dL (ref 100–199)
HDL: 56 mg/dL (ref >39)
LDL Chol Calc (NIH): 68 mg/dL (ref 0–99)
Triglycerides: 64 mg/dL (ref 0–149)
VLDL Cholesterol Cal: 13 mg/dL (ref 5–40)

## 2020-05-29 LAB — HEMOGLOBIN A1C
Est. average glucose Bld gHb Est-mCnc: 111 mg/dL
Hgb A1c MFr Bld: 5.5 % (ref 4.8–5.6)

## 2020-06-15 ENCOUNTER — Encounter: Payer: Self-pay | Admitting: Radiology

## 2020-06-26 ENCOUNTER — Telehealth: Payer: Self-pay | Admitting: Family Medicine

## 2020-06-26 ENCOUNTER — Other Ambulatory Visit: Payer: Self-pay

## 2020-06-26 DIAGNOSIS — E1142 Type 2 diabetes mellitus with diabetic polyneuropathy: Secondary | ICD-10-CM

## 2020-06-26 MED ORDER — GABAPENTIN 100 MG PO CAPS
100.0000 mg | ORAL_CAPSULE | Freq: Every evening | ORAL | 0 refills | Status: DC | PRN
Start: 2020-06-26 — End: 2020-07-12

## 2020-06-26 NOTE — Telephone Encounter (Signed)
Please call patient and clarify. Last OV with me was in aug and he did not mention these meds either. I have him just taking amlodipine for HTN. If however he has been taking these meds wo interruption, please refill, otherwise do not refill. thanks

## 2020-06-26 NOTE — Telephone Encounter (Signed)
Pt is requesting lisinopril and Gabapentin, Lisinopril not filled since 08/29/2019 and Gabapentin since 01/20/2019 pt has been out of these for a while and is now requesting them. Is this appropriate? Januvia just refilled for 3 months in august.

## 2020-06-26 NOTE — Telephone Encounter (Signed)
Patient needs a Medication management appointment and Blood pressure check please ask him to bring all his medications with him so we may make sure he has everything he needs as well as our therapy matches what he is taking

## 2020-06-26 NOTE — Telephone Encounter (Signed)
Pt needing refill on / pt has been out since last visit please advise   What is the name of the medication? gabapentin (NEURONTIN) 100 MG capsule [[id number] lisinopril (ZESTRIL) 20 MG tablet [[id number] sitaGLIPtin (JANUVIA) 100 MG tablet [[id number]    Have you contacted your pharmacy to request a refill? n  Which pharmacy would you like this sent to [facility], [address], [place], [place] [address]  Phone:  [phone] Fax:  [phone]  DEA #:  --   Patient notified that their request is being sent to the clinical staff for review and that they should receive a call once it is complete. If they do not receive a call within 72 hours they can check with their pharmacy or our office.

## 2020-06-27 NOTE — Telephone Encounter (Signed)
Called pt and sch appt for 07/02/20. Pt stated that he doesn't have some of his medication bottles he states he throws them away when he is done with them.

## 2020-07-06 ENCOUNTER — Other Ambulatory Visit: Payer: Self-pay | Admitting: Family Medicine

## 2020-07-06 DIAGNOSIS — E1122 Type 2 diabetes mellitus with diabetic chronic kidney disease: Secondary | ICD-10-CM

## 2020-07-12 ENCOUNTER — Other Ambulatory Visit: Payer: Self-pay

## 2020-07-12 ENCOUNTER — Ambulatory Visit (INDEPENDENT_AMBULATORY_CARE_PROVIDER_SITE_OTHER): Payer: Medicare Other | Admitting: Family Medicine

## 2020-07-12 ENCOUNTER — Encounter: Payer: Self-pay | Admitting: Family Medicine

## 2020-07-12 VITALS — BP 128/75 | HR 70 | Temp 98.0°F | Ht 73.0 in | Wt 172.0 lb

## 2020-07-12 DIAGNOSIS — Z23 Encounter for immunization: Secondary | ICD-10-CM

## 2020-07-12 DIAGNOSIS — R1031 Right lower quadrant pain: Secondary | ICD-10-CM

## 2020-07-12 DIAGNOSIS — E1142 Type 2 diabetes mellitus with diabetic polyneuropathy: Secondary | ICD-10-CM | POA: Diagnosis not present

## 2020-07-12 DIAGNOSIS — I10 Essential (primary) hypertension: Secondary | ICD-10-CM | POA: Diagnosis not present

## 2020-07-12 MED ORDER — GABAPENTIN 100 MG PO CAPS: 100.0000 mg | ORAL_CAPSULE | Freq: Every evening | ORAL | 1 refills | Status: DC | PRN

## 2020-07-12 MED ORDER — AMLODIPINE BESYLATE 10 MG PO TABS: 10.0000 mg | ORAL_TABLET | Freq: Every day | ORAL | 3 refills | Status: DC

## 2020-07-12 NOTE — Progress Notes (Signed)
9/30/202111:16 AM  Theodore Frank 1936/06/07, 84 y.o., male [id number]  Chief Complaint  Patient presents with  . Diabetes    102  to 110 FBS  . Hypertension    128/79 avg at  home  . R side piching / tingling    x 1 yr on and off / had hernia repair     HPI:   Patient is a 84 y.o. male with past medical history significant for HTN, DM2, CKD, HLP, prostate and colon cancer, hypercalcemia, vitamin D deficiency, basal cell carcinoma who presents today for right abd pain  Last OV aug 2021 - refilled amlodipine, he is taking everyday  He is reporting intermittent right lower abd pain, last episode several weeks ago He has history of right inguinal repair He denies any nausea, vomiting, changes in BM Pain is usually achy and tingling He denies any bulge He has been having issues with [facility] refilling his meds at time He has been out of [place] for months He will start taking gabapentin as it helps with tingling in his feet  Lab Results  Component Value Date   HGBA1C 5.5 05/28/2020     Depression screen [facility] 2/9 07/12/2020 05/28/2020 03/05/2020  Decreased Interest 0 0 0  Down, Depressed, Hopeless 0 0 0  PHQ - 2 Score 0 0 0    Fall Risk  07/12/2020 05/28/2020 03/05/2020 11/28/2019 10/17/2019  Falls in the past year? 0 0 0 0 0  Number falls in past yr: 0 0 0 0 0  Injury with Fall? 0 0 0 0 0  Follow up Falls evaluation completed - Falls evaluation completed;Education provided - Falls evaluation completed     No Known Allergies  Prior to Admission medications   Medication Sig Start Date End Date Taking? Authorizing Provider  atorvastatin (LIPITOR) 20 MG tablet Take 1 tablet (20 mg total) by mouth daily. 05/28/20  Yes Rutherford Guys, MD  metFORMIN (GLUCOPHAGE) 500 MG tablet TAKE 1 TABLET BY MOUTH  TWICE DAILY WITH A MEAL 07/07/20  Yes Rutherford Guys, MD  vitamin B-12 (CYANOCOBALAMIN) 1000 MCG tablet Take 1 tablet (1,000 mcg total) by mouth daily. 01/20/19  Yes Rutherford Guys,  MD  amLODipine (NORVASC) 10 MG tablet Take 1 tablet (10 mg total) by mouth daily. Patient not taking: Reported on 07/12/2020 05/28/20   Rutherford Guys, MD  gabapentin (NEURONTIN) 100 MG capsule Take 1-2 capsules (100-200 mg total) by mouth at bedtime as needed. Patient not taking: Reported on 07/12/2020 06/26/20   Rutherford Guys, MD  sildenafil (VIAGRA) 100 MG tablet Take 0.5-1 tablets (50-100 mg total) by mouth daily as needed for erectile dysfunction. Patient not taking: Reported on 07/12/2020 04/21/19   Rutherford Guys, MD  sitaGLIPtin (JANUVIA) 100 MG tablet Take 1 tablet (100 mg total) by mouth daily. Patient not taking: Reported on 07/12/2020 05/28/20   Rutherford Guys, MD    Past Medical History:  Diagnosis Date  . Acute encephalopathy 04/23/2016  . Anemia   . Arthritis   . Cancer ([place]) 10/14/1999   colon cancer (surgery; no chemo; no radiation)  . Diabetes mellitus   . Hyperlipidemia   . Hypertension   . Prostate cancer ([place]) 10/13/1192   s/p prostatectomy  . Right inguinal hernia 02/25/2012  . Shortness of breath dyspnea    with exertion    Past Surgical History:  Procedure Laterality Date  . COLON SURGERY  2001   colon resection for colon cancer  .  HERNIA REPAIR Right   . INGUINAL HERNIA REPAIR  03/05/2012   Procedure: HERNIA REPAIR INGUINAL ADULT;  Surgeon: [name], MD;  Location: [facility];  Service: General;  Laterality: Right;  Repair right inguinal hernia with mesh  . LUMBAR LAMINECTOMY/DECOMPRESSION MICRODISCECTOMY Left 11/06/2014   Procedure: LEFT LUMBAR THREE-FOUR LAMINECTOMY/DECOMPRESSION MICRODISCECTOMY 1 LEVEL;  Surgeon: [name], MD;  Location: [facility];  Service: Neurosurgery;  Laterality: Left;  . PROSTATE SURGERY     prostatectomy for prostate cancer    Social History   Tobacco Use  . Smoking status: Former Smoker    Quit date: 10/13/1965    Years since quitting: 54.7  . Smokeless tobacco: Never Used  Substance Use Topics    . Alcohol use: No    Family History  Problem Relation Age of Onset  . Hypertension Mother   . Thyroid disease Sister   . Diabetes Other     Review of Systems  Constitutional: Negative for chills and fever.  Respiratory: Negative for cough and shortness of breath.   Cardiovascular: Negative for chest pain, palpitations and leg swelling.  Gastrointestinal: Negative for abdominal pain, nausea and vomiting.     OBJECTIVE:  Today's Vitals   07/12/20 1048  BP: 128/75  Pulse: 70  Temp: 98 F (36.7 C)  SpO2: 97%  Weight: 172 lb (78 kg)  Height: 6\' 1"  (1.854 m)   Body mass index is 22.69 kg/m.   Physical Exam Vitals and nursing note reviewed.  Constitutional:      Appearance: He is well-developed.  HENT:     Head: Normocephalic and atraumatic.  Eyes:     Extraocular Movements: Extraocular movements intact.     Conjunctiva/sclera: Conjunctivae normal.     Pupils: Pupils are equal, round, and reactive to light.  Cardiovascular:     Rate and Rhythm: Normal rate and regular rhythm.     Heart sounds: No murmur heard.  No friction rub. No gallop.   Pulmonary:     Effort: Pulmonary effort is normal.     Breath sounds: Normal breath sounds. No wheezing, rhonchi or rales.  Abdominal:     General: Bowel sounds are normal. There is no distension.     Palpations: Abdomen is soft. There is no mass.     Tenderness: There is no abdominal tenderness.     Hernia: No hernia is present.  Musculoskeletal:     Cervical back: Neck supple.  Skin:    General: Skin is warm and dry.  Neurological:     Mental Status: He is alert and oriented to person, place, and time.     No results found for this or any previous visit (from the past 24 hour(s)).  No results found.   ASSESSMENT and PLAN  1. Essential hypertension Controlled. Continue current regime.  - amLODipine (NORVASC) 10 MG tablet; Take 1 tablet (10 mg total) by mouth daily.  2. Encounter for immunization - Flu  Vaccine QUAD High Dose(Fluad)  3. Diabetic polyneuropathy associated with type 2 diabetes mellitus ([place]) DM2 well controlled. Ok to stop [place] as has not been taking for months now. Cont with metformin. - gabapentin (NEURONTIN) 100 MG capsule; Take 1-2 capsules (100-200 mg total) by mouth at bedtime as needed.  4. RLQ abdominal pain None currently. No signs of concerns. Monitor clinically. RTC precautions reviewed   Return in about 6 months (around 01/09/2021).    Rutherford Guys, MD [facility]  [place], [place] [address] Ph.  [phone] Fax [phone]

## 2020-07-12 NOTE — Patient Instructions (Signed)
° ° ° °  If you have lab work done today you will be contacted with your lab results within the next 2 weeks.  If you have not heard from us then please contact us. The fastest way to get your results is to register for My Chart. ° ° °IF you received an x-ray today, you will receive an invoice from [facility]. Please contact [facility] at [phone] with questions or concerns regarding your invoice.  ° °IF you received labwork today, you will receive an invoice from [facility]. Please contact [facility] at [phone] with questions or concerns regarding your invoice.  ° °Our billing staff will not be able to assist you with questions regarding bills from these companies. ° °You will be contacted with the lab results as soon as they are available. The fastest way to get your results is to activate your My Chart account. Instructions are located on the last page of this paperwork. If you have not heard from us regarding the results in 2 weeks, please contact this office. °  ° ° ° °

## 2020-08-15 DIAGNOSIS — N5201 Erectile dysfunction due to arterial insufficiency: Secondary | ICD-10-CM | POA: Diagnosis not present

## 2020-08-25 DIAGNOSIS — H16141 Punctate keratitis, right eye: Secondary | ICD-10-CM | POA: Diagnosis not present

## 2020-08-25 DIAGNOSIS — E113293 Type 2 diabetes mellitus with mild nonproliferative diabetic retinopathy without macular edema, bilateral: Secondary | ICD-10-CM | POA: Diagnosis not present

## 2020-09-11 DIAGNOSIS — Z23 Encounter for immunization: Secondary | ICD-10-CM | POA: Diagnosis not present

## 2020-09-21 DIAGNOSIS — N5231 Erectile dysfunction following radical prostatectomy: Secondary | ICD-10-CM | POA: Diagnosis not present

## 2020-09-21 DIAGNOSIS — N5201 Erectile dysfunction due to arterial insufficiency: Secondary | ICD-10-CM | POA: Diagnosis not present

## 2021-01-11 ENCOUNTER — Encounter: Payer: Self-pay | Admitting: Family Medicine

## 2021-01-28 ENCOUNTER — Other Ambulatory Visit: Payer: Self-pay

## 2021-01-28 ENCOUNTER — Ambulatory Visit (INDEPENDENT_AMBULATORY_CARE_PROVIDER_SITE_OTHER): Payer: Medicare Other | Admitting: Internal Medicine

## 2021-01-28 ENCOUNTER — Encounter: Payer: Self-pay | Admitting: Internal Medicine

## 2021-01-28 VITALS — BP 124/82 | HR 69 | Ht 73.0 in | Wt 172.4 lb

## 2021-01-28 DIAGNOSIS — E213 Hyperparathyroidism, unspecified: Secondary | ICD-10-CM

## 2021-01-28 LAB — ALBUMIN: Albumin: 3.9 g/dL (ref 3.5–5.2)

## 2021-01-28 LAB — BASIC METABOLIC PANEL
BUN: 22 mg/dL (ref 6–23)
CO2: 27 mEq/L (ref 19–32)
Calcium: 11.3 mg/dL — ABNORMAL HIGH (ref 8.4–10.5)
Chloride: 107 mEq/L (ref 96–112)
Creatinine, Ser: 1.37 mg/dL (ref 0.40–1.50)
GFR: 47.95 mL/min — ABNORMAL LOW (ref >60.00)
Glucose, Bld: 97 mg/dL (ref 70–99)
Potassium: 3.9 mEq/L (ref 3.5–5.1)
Sodium: 141 mEq/L (ref 135–145)

## 2021-01-28 LAB — VITAMIN D 25 HYDROXY (VIT D DEFICIENCY, FRACTURES): VITD: 47.95 ng/mL (ref 30.00–100.00)

## 2021-01-28 NOTE — Patient Instructions (Signed)
-   Please stay hydrated  - AVOID over the counter calcium, and avoid low calcium in the diet - Take over the counter vitamin D3 1000 iu daily  - Consume 2-3 servings of dairy a day

## 2021-01-28 NOTE — Progress Notes (Signed)
Name: Theodore Frank  MRN/ DOB: [id number], 10-29-1935    Age/ Sex: 85 y.o., male     PCP: Jacelyn Pi, Lilia Argue, MD   Reason for Endocrinology Evaluation: Hypercalcemia      Initial Endocrinology Clinic Visit: 12/29/18    PATIENT IDENTIFIER: [name] is a 85 y.o., male with a past medical history of HTN, Hx of colon and prostate cancer.. He has followed with [facility] since 12/29/2018 for consultative assistance with management of his Hypercalcemia.   HISTORICAL SUMMARY: The patient was first noted with hypercalcemia in 2013.  HCTZ stopped in 11/2018  His calcium/creatinine ratio in the 24-hr urine is 0.011 , this is equivocal as in [place] is it < 0.010 and if primary hyperparathyroidism it is > 0.020   KUB shows no evidence of nephrolithiasis (2020)  DXA (08/01/2019)- Osteopenia  SUBJECTIVE:   Today (01/28/2021):  [name] is here for follow up on hypercalcemia. He denies polydispsia or constipation, has frequency  He denies renal stones.  He eats cheese daily  Compliant with vitamin d 1000 iu daily     HISTORY:  Past Medical History:  Past Medical History:  Diagnosis Date  . Acute encephalopathy 04/23/2016  . Anemia   . Arthritis   . Cancer ([place]) 10/14/1999   colon cancer (surgery; no chemo; no radiation)  . Diabetes mellitus   . Hyperlipidemia   . Hypertension   . Prostate cancer ([place]) 10/13/1192   s/p prostatectomy  . Right inguinal hernia 02/25/2012  . Shortness of breath dyspnea    with exertion   Past Surgical History:  Past Surgical History:  Procedure Laterality Date  . COLON SURGERY  2001   colon resection for colon cancer  . HERNIA REPAIR Right   . INGUINAL HERNIA REPAIR  03/05/2012   Procedure: HERNIA REPAIR INGUINAL ADULT;  Surgeon: [name], MD;  Location: [facility];  Service: General;  Laterality: Right;  Repair right inguinal hernia with mesh  . LUMBAR LAMINECTOMY/DECOMPRESSION MICRODISCECTOMY Left  11/06/2014   Procedure: LEFT LUMBAR THREE-FOUR LAMINECTOMY/DECOMPRESSION MICRODISCECTOMY 1 LEVEL;  Surgeon: [name], MD;  Location: [facility];  Service: Neurosurgery;  Laterality: Left;  . PROSTATE SURGERY     prostatectomy for prostate cancer    Social History:  reports that he quit smoking about 55 years ago. He has never used smokeless tobacco. He reports that he does not drink alcohol and does not use drugs. Family History:  Family History  Problem Relation Age of Onset  . Hypertension Mother   . Thyroid disease Sister   . Diabetes Other      HOME MEDICATIONS: Allergies as of 01/28/2021   No Known Allergies     Medication List       Accurate as of January 28, 2021  7:44 AM. If you have any questions, ask your nurse or doctor.        amLODipine 10 MG tablet Commonly known as: NORVASC Take 1 tablet (10 mg total) by mouth daily.   atorvastatin 20 MG tablet Commonly known as: LIPITOR Take 1 tablet (20 mg total) by mouth daily.   gabapentin 100 MG capsule Commonly known as: NEURONTIN Take 1-2 capsules (100-200 mg total) by mouth at bedtime as needed.   metFORMIN 500 MG tablet Commonly known as: GLUCOPHAGE TAKE 1 TABLET BY MOUTH  TWICE DAILY WITH A MEAL   vitamin B-12 1000 MCG tablet Commonly known as: CYANOCOBALAMIN Take 1 tablet (1,000 mcg total) by  mouth daily.         OBJECTIVE:   PHYSICAL EXAM: VS: BP 124/82   Pulse 69   Ht 6\' 1"  (1.854 m)   Wt 172 lb 6 oz (78.2 kg)   SpO2 98%   BMI 22.74 kg/m    EXAM: General: Pt appears well and is in NAD  Neck: General: Supple without adenopathy. Thyroid: Thyroid size normal.  No goiter or nodules appreciated. No thyroid bruit.  Lungs: Clear with good BS bilat with no rales, rhonchi, or wheezes  Heart: Auscultation: RRR.  Extremities:  BL LE: No pretibial edema normal ROM and strength.  Mental Status: Judgment, insight: Intact Orientation: Oriented to time, place, and person Mood and affect: No  depression, anxiety, or agitation     DATA REVIEWED: Results for Theodore Frank, Theodore Frank (MRN [id number]) as of 01/30/2021 10:49  Ref. Range 01/28/2021 07:58  Sodium Latest Ref Range: 135 - 145 mEq/L 141  Potassium Latest Ref Range: 3.5 - 5.1 mEq/L 3.9  Chloride Latest Ref Range: 96 - 112 mEq/L 107  CO2 Latest Ref Range: 19 - 32 mEq/L 27  Glucose Latest Ref Range: 70 - 99 mg/dL 97  BUN Latest Ref Range: 6 - 23 mg/dL 22  Creatinine Latest Ref Range: 0.40 - 1.50 mg/dL 1.37  Calcium Latest Ref Range: 8.4 - 10.5 mg/dL 11.3 (H)  Albumin Latest Ref Range: 3.5 - 5.2 g/dL 3.9  GFR Latest Ref Range: >60.00 mL/min 47.95 (L)  VITD Latest Ref Range: 30.00 - 100.00 ng/mL 47.95  PTH, Intact Latest Ref Range: 16 - 77 pg/mL 80 (H)      KUB 07/25/2019 NO evidence of renal stones    DXA 08/01/2019) Osteopenia   ASSESSMENT / PLAN / RECOMMENDATIONS:   1. Primary hyperparathyroidism :   - Pt is asymptomatic  - His calcium/creatinine ratio in the 24-hr urine is 0.011 , this is most likely due to low calcium diet - I have encouraged him again to consume 2-3 servings of calcium daily  - KUB shows no evidence of nephrolithiasis  - DXA (08/01/2019)- Osteopenia - He was encouraged to stay hydrated  - Avoid OTC calcium tablets  - BMP shows fluctuating GFR, if this remains below 60 , will consider surgical intervention    F/u in 1 yr  Signed electronically by: Mack Guise, MD  [facility]  [facility] [address] [place], [place] [address] Phone: [phone] FAX: [phone]      CC: Jacelyn Pi, Lilia Argue, MD [address] [place] [address] Phone: [phone]  Fax: [phone]   Return to Endocrinology clinic as below: No future appointments.

## 2021-01-29 LAB — PARATHYROID HORMONE, INTACT (NO CA): PTH: 80 pg/mL — ABNORMAL HIGH (ref 16–77)

## 2021-01-30 ENCOUNTER — Encounter: Payer: Self-pay | Admitting: Internal Medicine

## 2021-06-10 DIAGNOSIS — J449 Chronic obstructive pulmonary disease, unspecified: Secondary | ICD-10-CM | POA: Diagnosis not present

## 2021-06-10 DIAGNOSIS — I771 Stricture of artery: Secondary | ICD-10-CM | POA: Diagnosis not present

## 2021-06-10 DIAGNOSIS — E1142 Type 2 diabetes mellitus with diabetic polyneuropathy: Secondary | ICD-10-CM | POA: Diagnosis not present

## 2021-06-10 DIAGNOSIS — F17211 Nicotine dependence, cigarettes, in remission: Secondary | ICD-10-CM | POA: Diagnosis not present

## 2021-06-10 DIAGNOSIS — M1612 Unilateral primary osteoarthritis, left hip: Secondary | ICD-10-CM | POA: Diagnosis not present

## 2021-06-10 DIAGNOSIS — E785 Hyperlipidemia, unspecified: Secondary | ICD-10-CM | POA: Diagnosis not present

## 2021-06-10 DIAGNOSIS — E210 Primary hyperparathyroidism: Secondary | ICD-10-CM | POA: Diagnosis not present

## 2021-06-10 DIAGNOSIS — Z79899 Other long term (current) drug therapy: Secondary | ICD-10-CM | POA: Diagnosis not present

## 2021-06-10 DIAGNOSIS — I10 Essential (primary) hypertension: Secondary | ICD-10-CM | POA: Diagnosis not present

## 2021-06-10 DIAGNOSIS — Z008 Encounter for other general examination: Secondary | ICD-10-CM | POA: Diagnosis not present

## 2021-06-10 DIAGNOSIS — Z0000 Encounter for general adult medical examination without abnormal findings: Secondary | ICD-10-CM | POA: Diagnosis not present

## 2021-07-05 DIAGNOSIS — Z23 Encounter for immunization: Secondary | ICD-10-CM | POA: Diagnosis not present

## 2021-07-05 DIAGNOSIS — K4090 Unilateral inguinal hernia, without obstruction or gangrene, not specified as recurrent: Secondary | ICD-10-CM | POA: Diagnosis not present

## 2021-07-05 DIAGNOSIS — Z0000 Encounter for general adult medical examination without abnormal findings: Secondary | ICD-10-CM | POA: Diagnosis not present

## 2021-07-05 DIAGNOSIS — E785 Hyperlipidemia, unspecified: Secondary | ICD-10-CM | POA: Diagnosis not present

## 2021-07-05 DIAGNOSIS — Z0001 Encounter for general adult medical examination with abnormal findings: Secondary | ICD-10-CM | POA: Diagnosis not present

## 2021-07-05 DIAGNOSIS — E1142 Type 2 diabetes mellitus with diabetic polyneuropathy: Secondary | ICD-10-CM | POA: Diagnosis not present

## 2021-07-05 DIAGNOSIS — M1612 Unilateral primary osteoarthritis, left hip: Secondary | ICD-10-CM | POA: Diagnosis not present

## 2021-07-05 DIAGNOSIS — N1831 Chronic kidney disease, stage 3a: Secondary | ICD-10-CM | POA: Diagnosis not present

## 2021-07-05 DIAGNOSIS — J449 Chronic obstructive pulmonary disease, unspecified: Secondary | ICD-10-CM | POA: Diagnosis not present

## 2021-07-05 DIAGNOSIS — Z008 Encounter for other general examination: Secondary | ICD-10-CM | POA: Diagnosis not present

## 2021-07-05 DIAGNOSIS — H18413 Arcus senilis, bilateral: Secondary | ICD-10-CM | POA: Diagnosis not present

## 2021-07-05 DIAGNOSIS — I129 Hypertensive chronic kidney disease with stage 1 through stage 4 chronic kidney disease, or unspecified chronic kidney disease: Secondary | ICD-10-CM | POA: Diagnosis not present

## 2021-08-26 ENCOUNTER — Other Ambulatory Visit (HOSPITAL_COMMUNITY): Payer: Self-pay | Admitting: Family Medicine

## 2021-08-26 DIAGNOSIS — I10 Essential (primary) hypertension: Secondary | ICD-10-CM

## 2021-08-28 ENCOUNTER — Other Ambulatory Visit: Payer: Self-pay

## 2021-08-28 ENCOUNTER — Ambulatory Visit (HOSPITAL_COMMUNITY): Payer: Medicare Other | Attending: Family Medicine

## 2021-08-28 DIAGNOSIS — I10 Essential (primary) hypertension: Secondary | ICD-10-CM | POA: Insufficient documentation

## 2021-08-28 LAB — ECHOCARDIOGRAM COMPLETE
Area-P 1/2: 2.5 cm2
MV M vel: 4.97 m/s
MV Peak grad: 98.8 mmHg
S' Lateral: 4.8 cm

## 2021-09-23 DIAGNOSIS — H40033 Anatomical narrow angle, bilateral: Secondary | ICD-10-CM | POA: Diagnosis not present

## 2021-09-23 DIAGNOSIS — H2513 Age-related nuclear cataract, bilateral: Secondary | ICD-10-CM | POA: Diagnosis not present

## 2022-02-03 ENCOUNTER — Ambulatory Visit
Admission: RE | Admit: 2022-02-03 | Discharge: 2022-02-03 | Disposition: A | Discharge status: Home / Self Care | Payer: Medicare Other | Source: Ambulatory Visit | Attending: Internal Medicine | Admitting: Internal Medicine

## 2022-02-03 ENCOUNTER — Telehealth: Payer: Self-pay | Admitting: Internal Medicine

## 2022-02-03 ENCOUNTER — Ambulatory Visit (INDEPENDENT_AMBULATORY_CARE_PROVIDER_SITE_OTHER): Payer: Medicare Other | Admitting: Internal Medicine

## 2022-02-03 ENCOUNTER — Encounter: Payer: Self-pay | Admitting: Internal Medicine

## 2022-02-03 VITALS — BP 140/80 | HR 75 | Ht 73.0 in | Wt 167.8 lb

## 2022-02-03 DIAGNOSIS — E213 Hyperparathyroidism, unspecified: Secondary | ICD-10-CM | POA: Diagnosis not present

## 2022-02-03 DIAGNOSIS — G8929 Other chronic pain: Secondary | ICD-10-CM

## 2022-02-03 DIAGNOSIS — M25531 Pain in right wrist: Secondary | ICD-10-CM | POA: Diagnosis not present

## 2022-02-03 LAB — ALBUMIN: Albumin: 3.9 g/dL (ref 3.5–5.2)

## 2022-02-03 LAB — BASIC METABOLIC PANEL
BUN: 20 mg/dL (ref 6–23)
CO2: 26 mEq/L (ref 19–32)
Calcium: 10.1 mg/dL (ref 8.4–10.5)
Chloride: 109 mEq/L (ref 96–112)
Creatinine, Ser: 1.13 mg/dL (ref 0.40–1.50)
GFR: 59.98 mL/min — ABNORMAL LOW (ref >60.00)
Glucose, Bld: 109 mg/dL — ABNORMAL HIGH (ref 70–99)
Potassium: 3.7 mEq/L (ref 3.5–5.1)
Sodium: 142 mEq/L (ref 135–145)

## 2022-02-03 LAB — VITAMIN D 25 HYDROXY (VIT D DEFICIENCY, FRACTURES): VITD: 42.72 ng/mL (ref 30.00–100.00)

## 2022-02-03 NOTE — Patient Instructions (Signed)
-   Please stay hydrated  ?- AVOID over the counter calcium, and avoid low calcium in the diet ?- Take over the counter vitamin D3 1000 iu daily  ?- Consume 2-3 servings of dairy a day ? ? ? ? ?

## 2022-02-03 NOTE — Telephone Encounter (Signed)
Please schedule him for DXA at [facility]  ? ? ?Thanks  ?

## 2022-02-03 NOTE — Telephone Encounter (Signed)
Detail vm left for patient with appointment for Bone Density. 02/10/22 at 10am @ [facility].  ?

## 2022-02-03 NOTE — Progress Notes (Signed)
? ?Name: Jereld Presti  ?MRN/ DOB: [id number], 08/18/1936    ?Age/ Sex: 86 y.o., male   ? ? ?PCP: Jacelyn Pi, Lilia Argue, MD   ?Reason for Endocrinology Evaluation: Hypercalcemia   ?   ?Initial Endocrinology Clinic Visit: 12/29/18  ? ? ?PATIENT IDENTIFIER: Theodore Frank is a 86 y.o., male with a past medical history of HTN, Hx of colon and prostate cancer.. He has followed with [facility] since 12/29/2018 for consultative assistance with management of his Hypercalcemia.  ? ?HISTORICAL SUMMARY: The patient was first noted with hypercalcemia in 2013.  ?HCTZ stopped in 11/2018 ? ?His calcium/creatinine ratio in the 24-hr urine is 0.011 , this is equivocal as in [place] is it < 0.010 and if primary hyperparathyroidism it is > 0.020 ? ? ?KUB shows no evidence of nephrolithiasis (2020) ?DXA (08/01/2019)- Osteopenia ? ?SUBJECTIVE:  ? ?Today (02/03/2022):  Theodore Frank is here for follow up on hypercalcemia. ? ? ?He had a fall in 09/2021 ,currently with right hand pain at the based of the thumb but no swelling  ? ?He denies polydipsia but has nocturia  ?He denies constipation  ?He denies renal stones.  ?He eats cheese daily  ?He is on  vitamin d 1000 iu daily  ? ? ? ?HISTORY:  ?Past Medical History:  ?Past Medical History:  ?Diagnosis Date  ? Acute encephalopathy 04/23/2016  ? Anemia   ? Arthritis   ? Cancer ([place]) 10/14/1999  ? colon cancer (surgery; no chemo; no radiation)  ? Diabetes mellitus   ? Hyperlipidemia   ? Hypertension   ? Prostate cancer ([place]) 10/13/1192  ? s/p prostatectomy  ? Right inguinal hernia 02/25/2012  ? Shortness of breath dyspnea   ? with exertion  ? ?Past Surgical History:  ?Past Surgical History:  ?Procedure Laterality Date  ? COLON SURGERY  2001  ? colon resection for colon cancer  ? HERNIA REPAIR Right   ? INGUINAL HERNIA REPAIR  03/05/2012  ? Procedure: HERNIA REPAIR INGUINAL ADULT;  Surgeon: [name], MD;  Location: [facility];  Service: General;  Laterality: Right;   Repair right inguinal hernia with mesh  ? LUMBAR LAMINECTOMY/DECOMPRESSION MICRODISCECTOMY Left 11/06/2014  ? Procedure: LEFT LUMBAR THREE-FOUR LAMINECTOMY/DECOMPRESSION MICRODISCECTOMY 1 LEVEL;  Surgeon: [name], MD;  Location: [name];  Service: Neurosurgery;  Laterality: Left;  ? PROSTATE SURGERY    ? prostatectomy for prostate cancer  ? ?Social History:  reports that he quit smoking about 56 years ago. He has never used smokeless tobacco. He reports that he does not drink alcohol and does not use drugs. ?Family History:  ?Family History  ?Problem Relation Age of Onset  ? Hypertension Mother   ? Thyroid disease Sister   ? Diabetes Other   ? ? ? ?HOME MEDICATIONS: ?Allergies as of 02/03/2022   ?No Known Allergies ?  ? ?  ?Medication List  ?  ? ?  ? Accurate as of February 03, 2022  7:02 AM. If you have any questions, ask your nurse or doctor.  ?  ?  ? ?  ? ?amLODipine 10 MG tablet ?Commonly known as: NORVASC ?Take 1 tablet (10 mg total) by mouth daily. ?  ?atorvastatin 20 MG tablet ?Commonly known as: LIPITOR ?Take 1 tablet (20 mg total) by mouth daily. ?  ?gabapentin 100 MG capsule ?Commonly known as: NEURONTIN ?Take 1-2 capsules (100-200 mg total) by mouth at bedtime as needed. ?  ?metFORMIN 500 MG tablet ?Commonly known  as: GLUCOPHAGE ?TAKE 1 TABLET BY MOUTH  TWICE DAILY WITH A MEAL ?  ?vitamin B-12 1000 MCG tablet ?Commonly known as: CYANOCOBALAMIN ?Take 1 tablet (1,000 mcg total) by mouth daily. ?  ? ?  ? ? ? ? ?OBJECTIVE:  ? ?PHYSICAL EXAM: ?VS: BP 140/80 (BP Location: Left Arm, Patient Position: Sitting, Cuff Size: Small)   Pulse 75   Ht '6\' 1"'$  (1.854 m)   Wt 167 lb 12.8 oz (76.1 kg)   SpO2 97%   BMI 22.14 kg/m?  ? ?EXAM: ?General: Pt appears well and is in NAD  ?Neck: General: Supple without adenopathy. ?Thyroid: Thyroid size normal.  No goiter or nodules appreciated. No thyroid bruit.  ?Lungs: Clear with good BS bilat with no rales, rhonchi, or wheezes  ?Heart: Auscultation: RRR.  ?Extremities:  [id number] no swelling or tenderness over B/L wrists  ?BL LE: No pretibial edema normal ROM and strength.  ?Mental Status: Judgment, insight: Intact ?Orientation: Oriented to time, place, and person ?Mood and affect: No depression, anxiety, or agitation  ? ? ? ?DATA REVIEWED: ? ? Latest Reference Range & Units 02/03/22 08:14  ?Sodium 135 - 145 mEq/L 142  ?Potassium 3.5 - 5.1 mEq/L 3.7  ?Chloride 96 - 112 mEq/L 109  ?CO2 19 - 32 mEq/L 26  ?Glucose 70 - 99 mg/dL 109 (H)  ?BUN 6 - 23 mg/dL 20  ?Creatinine 0.40 - 1.50 mg/dL 1.13  ?Calcium 8.4 - 10.5 mg/dL 10.1  ?Albumin 3.5 - 5.2 g/dL 3.9  ?GFR >60.00 mL/min 59.98 (L)  ? ? Latest Reference Range & Units 02/03/22 08:14  ?VITD 30.00 - 100.00 ng/mL 42.72  ? ? ? ? ?KUB 07/25/2019 ?NO evidence of renal stones  ? ? ?DXA 08/01/2019 ?Osteopenia  ? ? ?Hand X-ray 02/03/2022 ?No acute or evidence of prior fracture. ?2. Osteoarthritis of the thumb carpometacarpal joint. Occasional ?degenerative carpal bone cysts. ? ?ASSESSMENT / PLAN / RECOMMENDATIONS:  ? ?Primary hyperparathyroidism :  ? ?- Pt is asymptomatic  ?- His calcium/creatinine ratio in the 24-hr urine is 0.011 , this is most likely due to low calcium diet ?- I have encouraged him again to consume 2-3 servings of calcium daily  ?- KUB shows no evidence of nephrolithiasis  ?- DXA (08/01/2019)- Osteopenia, will repeat this year ?-BMP shows normalization of his serum calcium with normal vitamin D and improving GFR ? ? ?Recommendations ?- Stay hydrated  ?- Avoid OTC calcium tablets  ?-Consume 2-3 servings of dietary calcium daily ? ? ? ?2. Right wrist pain : ? ?-On exam today there is no wrist swelling or tenderness no erythema ?-An x-ray has been ordered of the wrist which shows no evidence of fracture but evidence of degenerative joint disease ? ?F/u in 1 yr ? ?Signed electronically by: ?Abby Nena Jordan, MD ? ?[facility]  ?[facility] ?[address] ?[place], [place]  [address] ?Phone: [phone] ?FAX: [phone]  ? ? ? ? ?CC: ?Jacelyn Pi, Lilia Argue, MD ?[address] ?[place] [address] ?Phone: [phone]  ?Fax: [phone] ? ? ?Return to Endocrinology clinic as below: ?Future Appointments  ?Date Time Provider [place]  ?02/03/2022  7:30 AM Theodore Frank, Melanie Crazier, MD [id number] None  ?  ? ?

## 2022-02-04 ENCOUNTER — Encounter: Payer: Self-pay | Admitting: Internal Medicine

## 2022-02-04 LAB — PARATHYROID HORMONE, INTACT (NO CA): PTH: 112 pg/mL — ABNORMAL HIGH (ref 16–77)

## 2022-02-10 ENCOUNTER — Ambulatory Visit (INDEPENDENT_AMBULATORY_CARE_PROVIDER_SITE_OTHER)
Admission: RE | Admit: 2022-02-10 | Discharge: 2022-02-10 | Disposition: A | Discharge status: Home / Self Care | Payer: Medicare Other | Source: Ambulatory Visit | Attending: Internal Medicine | Admitting: Internal Medicine

## 2022-02-10 DIAGNOSIS — E213 Hyperparathyroidism, unspecified: Secondary | ICD-10-CM

## 2022-02-12 ENCOUNTER — Encounter: Payer: Self-pay | Admitting: Internal Medicine

## 2022-07-17 ENCOUNTER — Other Ambulatory Visit: Payer: Self-pay | Admitting: Gastroenterology

## 2022-07-17 DIAGNOSIS — R1310 Dysphagia, unspecified: Secondary | ICD-10-CM

## 2022-07-25 ENCOUNTER — Other Ambulatory Visit: Payer: Medicare Other

## 2022-08-01 ENCOUNTER — Ambulatory Visit
Admission: RE | Admit: 2022-08-01 | Discharge: 2022-08-01 | Disposition: A | Discharge status: Home / Self Care | Payer: Medicare Other | Source: Ambulatory Visit | Attending: Gastroenterology | Admitting: Gastroenterology

## 2022-08-01 DIAGNOSIS — R1310 Dysphagia, unspecified: Secondary | ICD-10-CM

## 2022-11-13 ENCOUNTER — Ambulatory Visit
Admission: RE | Admit: 2022-11-13 | Discharge: 2022-11-13 | Disposition: A | Discharge status: Home / Self Care | Payer: Medicare Other | Source: Ambulatory Visit | Attending: Family Medicine | Admitting: Family Medicine

## 2022-11-13 ENCOUNTER — Other Ambulatory Visit: Payer: Self-pay | Admitting: Family Medicine

## 2022-11-13 DIAGNOSIS — M25512 Pain in left shoulder: Secondary | ICD-10-CM

## 2024-02-09 ENCOUNTER — Other Ambulatory Visit: Payer: Self-pay

## 2024-02-09 ENCOUNTER — Emergency Department (HOSPITAL_COMMUNITY)

## 2024-02-09 ENCOUNTER — Emergency Department (HOSPITAL_COMMUNITY)
Admission: EM | Admit: 2024-02-09 | Discharge: 2024-02-09 | Disposition: A | Discharge status: Home / Self Care | Attending: Emergency Medicine | Admitting: Emergency Medicine

## 2024-02-09 DIAGNOSIS — N183 Chronic kidney disease, stage 3 unspecified: Secondary | ICD-10-CM | POA: Insufficient documentation

## 2024-02-09 DIAGNOSIS — E039 Hypothyroidism, unspecified: Secondary | ICD-10-CM | POA: Diagnosis not present

## 2024-02-09 DIAGNOSIS — Z7984 Long term (current) use of oral hypoglycemic drugs: Secondary | ICD-10-CM | POA: Insufficient documentation

## 2024-02-09 DIAGNOSIS — Z79899 Other long term (current) drug therapy: Secondary | ICD-10-CM | POA: Diagnosis not present

## 2024-02-09 DIAGNOSIS — E1122 Type 2 diabetes mellitus with diabetic chronic kidney disease: Secondary | ICD-10-CM | POA: Insufficient documentation

## 2024-02-09 DIAGNOSIS — I7121 Aneurysm of the ascending aorta, without rupture: Secondary | ICD-10-CM | POA: Insufficient documentation

## 2024-02-09 DIAGNOSIS — I129 Hypertensive chronic kidney disease with stage 1 through stage 4 chronic kidney disease, or unspecified chronic kidney disease: Secondary | ICD-10-CM | POA: Diagnosis not present

## 2024-02-09 DIAGNOSIS — J449 Chronic obstructive pulmonary disease, unspecified: Secondary | ICD-10-CM | POA: Diagnosis not present

## 2024-02-09 DIAGNOSIS — R0601 Orthopnea: Secondary | ICD-10-CM

## 2024-02-09 DIAGNOSIS — R0602 Shortness of breath: Secondary | ICD-10-CM | POA: Diagnosis present

## 2024-02-09 DIAGNOSIS — R079 Chest pain, unspecified: Secondary | ICD-10-CM

## 2024-02-09 LAB — BASIC METABOLIC PANEL WITH GFR
Anion gap: 10 (ref 5–15)
BUN: 26 mg/dL — ABNORMAL HIGH (ref 8–23)
CO2: 20 mmol/L — ABNORMAL LOW (ref 22–32)
Calcium: 10.5 mg/dL — ABNORMAL HIGH (ref 8.9–10.3)
Chloride: 108 mmol/L (ref 98–111)
Creatinine, Ser: 1.22 mg/dL (ref 0.61–1.24)
GFR, Estimated: 58 mL/min — ABNORMAL LOW (ref >60)
Glucose, Bld: 156 mg/dL — ABNORMAL HIGH (ref 70–99)
Potassium: 4.3 mmol/L (ref 3.5–5.1)
Sodium: 138 mmol/L (ref 135–145)

## 2024-02-09 LAB — CBC
HCT: 44.7 % (ref 39.0–52.0)
Hemoglobin: 15.1 g/dL (ref 13.0–17.0)
MCH: 31.8 pg (ref 26.0–34.0)
MCHC: 33.8 g/dL (ref 30.0–36.0)
MCV: 94.1 fL (ref 80.0–100.0)
Platelets: 155 10*3/uL (ref 150–400)
RBC: 4.75 MIL/uL (ref 4.22–5.81)
RDW: 12.5 % (ref 11.5–15.5)
WBC: 5.8 10*3/uL (ref 4.0–10.5)
nRBC: 0 % (ref 0.0–0.2)

## 2024-02-09 LAB — BRAIN NATRIURETIC PEPTIDE: B Natriuretic Peptide: 417.2 pg/mL — ABNORMAL HIGH (ref 0.0–100.0)

## 2024-02-09 LAB — TROPONIN I (HIGH SENSITIVITY)
Troponin I (High Sensitivity): 24 ng/L — ABNORMAL HIGH (ref <18)
Troponin I (High Sensitivity): 31 ng/L — ABNORMAL HIGH (ref <18)

## 2024-02-09 MED ORDER — POTASSIUM CHLORIDE CRYS ER 20 MEQ PO TBCR: 20.0000 meq | EXTENDED_RELEASE_TABLET | ORAL | 0 refills | Status: DC | PRN

## 2024-02-09 MED ORDER — SODIUM CHLORIDE (PF) 0.9 % IJ SOLN
INTRAMUSCULAR | Status: AC
  Filled 2024-02-09: qty 50

## 2024-02-09 MED ORDER — FUROSEMIDE 20 MG PO TABS: 20.0000 mg | ORAL_TABLET | ORAL | 0 refills | Status: DC | PRN

## 2024-02-09 MED ORDER — IOHEXOL 350 MG/ML SOLN
75.0000 mL | Freq: Once | INTRAVENOUS | Status: AC | PRN
  Administered 2024-02-09: 75 mL via INTRAVENOUS

## 2024-02-09 MED ORDER — FUROSEMIDE 40 MG PO TABS
20.0000 mg | ORAL_TABLET | Freq: Once | ORAL | Status: AC
  Administered 2024-02-09: 20 mg via ORAL
  Filled 2024-02-09: qty 1

## 2024-02-09 NOTE — ED Triage Notes (Addendum)
 Pt noticed increased SOB 3-4 days ago. SOB associated with centralized chest tightness and fatigue with exertion. No cough

## 2024-02-09 NOTE — Discharge Instructions (Addendum)
 It was a pleasure taking part in your care.  As we discussed, I do believe that you possibly have early symptoms of congestive heart failure.  Please follow-up with cardiology.  They will be calling you to make an appointment.  Please begin taking 20 mg of Lasix as needed for leg swelling and shortness of breath worse at night lying flat.  If you take Lasix, please take 2 tablets of potassium as well.  You also were found to have an ascending aortic aneurysm.  Please follow-up with vascular surgery regarding this.  Please call them and state "I need an appointment for an ascending aortic aneurysm, I was recently seen in the ED".  Please return to the ED with any new or worsening symptoms.

## 2024-02-09 NOTE — ED Provider Notes (Signed)
 [place] [facility] AT [facility] Provider Note   CSN: [id number] Arrival date & time: 02/09/24  5284     History  Chief Complaint  Patient presents with   Shortness of Breath    Theodore Frank is a 88 y.o. male with medical history of type 2 diabetes, hypothyroid, hyperlipidemia, hypertension, CKD stage III, COPD.  Patient presents to ED for evaluation of shortness of breath and chest tightness.  He reports that for the last 3 to 4 days he has had chest tightness and shortness of breath primarily noticed at night when he is lying flat on his back.  He states during the day when he was sitting upright he has minimal symptoms but reports that at night when he lays flat on his back his symptoms increase and worsen.  He denies any radiation of this chest tightness.  He hesitates to call it a pain.  He states his main concern is the shortness of breath that has been worsening over the last 3 to 4 days.  He is endorsing minimal leg swelling.  He denies any abdominal distention.  Denies nausea, vomiting, fevers, lightheadedness, dizziness, weakness.  Denies history of CHF.  Denies taking fluid pill.  Denies recent surgery or travel, testosterone  use, history of DVT or PE, hemoptysis.   Shortness of Breath Associated symptoms: chest pain   Associated symptoms: no abdominal pain, no fever and no vomiting        Home Medications Prior to Admission medications   Medication Sig Start Date End Date Taking? Authorizing Provider  furosemide (LASIX) 20 MG tablet Take 1 tablet (20 mg total) by mouth as needed for edema (As needed for shortness of breath at night while laying flat or leg swelling). 02/09/24  Yes Adel Aden, PA-C  potassium chloride  SA (KLOR-CON  M) 20 MEQ tablet Take 1 tablet (20 mEq total) by mouth as needed (Take 2 tablet of potassium anytime that you take 20mg  of lasix). 02/09/24  Yes Adel Aden, PA-C  amLODipine  (NORVASC ) 10 MG tablet Take 1  tablet (10 mg total) by mouth daily. 07/12/20   Orvel Blanco, MD  atorvastatin  (LIPITOR) 20 MG tablet Take 1 tablet (20 mg total) by mouth daily. 05/28/20   Elyce Hams, Marguerita Shih, MD  gabapentin  (NEURONTIN ) 100 MG capsule Take 1-2 capsules (100-200 mg total) by mouth at bedtime as needed. 07/12/20   Orvel Blanco, MD  metFORMIN  (GLUCOPHAGE ) 500 MG tablet TAKE 1 TABLET BY MOUTH  TWICE DAILY WITH A MEAL 07/07/20   Elyce Hams, Marguerita Shih, MD  vitamin B-12 (CYANOCOBALAMIN) 1000 MCG tablet Take 1 tablet (1,000 mcg total) by mouth daily. 01/20/19   Orvel Blanco, MD      Allergies    Patient has no known allergies.    Review of Systems   Review of Systems  Constitutional:  Negative for fever.  Respiratory:  Positive for shortness of breath.   Cardiovascular:  Positive for chest pain and leg swelling.  Gastrointestinal:  Negative for abdominal pain, nausea and vomiting.  Neurological:  Negative for dizziness, weakness and light-headedness.  All other systems reviewed and are negative.   Physical Exam Updated Vital Signs BP (!) 145/95   Pulse 71   Temp (!) 97.5 F (36.4 C) (Oral)   Resp (!) 24   SpO2 98%  Physical Exam Vitals and nursing note reviewed.  Constitutional:      General: He is not in acute distress.  Appearance: He is well-developed.  HENT:     Head: Normocephalic and atraumatic.  Eyes:     Conjunctiva/sclera: Conjunctivae normal.  Cardiovascular:     Rate and Rhythm: Normal rate and regular rhythm.     Heart sounds: No murmur heard. Pulmonary:     Effort: Pulmonary effort is normal. No respiratory distress.     Breath sounds: Normal breath sounds. No wheezing or rales.  Abdominal:     Palpations: Abdomen is soft.     Tenderness: There is no abdominal tenderness.  Musculoskeletal:        General: No swelling.     Cervical back: Neck supple.     Right lower leg: Edema present.     Left lower leg: Edema present.     Comments: Minimal 1+  nonpitting edema to bilateral lower extremities  Skin:    General: Skin is warm and dry.     Capillary Refill: Capillary refill takes less than 2 seconds.  Neurological:     Mental Status: He is alert and oriented to person, place, and time. Mental status is at baseline.  Psychiatric:        Mood and Affect: Mood normal.     ED Results / Procedures / Treatments   Labs (all labs ordered are listed, but only abnormal results are displayed) Labs Reviewed  BRAIN NATRIURETIC PEPTIDE - Abnormal; Notable for the following components:      Result Value   B Natriuretic Peptide 417.2 (*)    All other components within normal limits  BASIC METABOLIC PANEL WITH GFR - Abnormal; Notable for the following components:   CO2 20 (*)    Glucose, Bld 156 (*)    BUN 26 (*)    Calcium  10.5 (*)    GFR, Estimated 58 (*)    All other components within normal limits  TROPONIN I (HIGH SENSITIVITY) - Abnormal; Notable for the following components:   Troponin I (High Sensitivity) 24 (*)    All other components within normal limits  TROPONIN I (HIGH SENSITIVITY) - Abnormal; Notable for the following components:   Troponin I (High Sensitivity) 31 (*)    All other components within normal limits  CBC    EKG EKG Interpretation Date/Time:  Tuesday February 09 2024 07:56:54 EDT Ventricular Rate:  76 PR Interval:  224 QRS Duration:  102 QT Interval:  386 QTC Calculation: 434 R Axis:   11  Text Interpretation: Sinus rhythm Ventricular premature complex Prolonged PR interval Left atrial enlargement Left ventricular hypertrophy Nonspecific T abnormalities, lateral leads No acute changes No significant change since last tracing Confirmed by Deatra Face [phone]) on 02/09/2024 11:33:33 AM  Radiology CT Angio Chest PE W and/or Wo Contrast Result Date: 02/09/2024 EXAM: CTA of the Chest with contrast for PE 02/09/2024 10:20:17 AM TECHNIQUE: CTA of the chest was performed after the administration of intravenous  contrast. The study was performed with IV contrast, utilizing 75mL of iohexol (OMNIPAQUE) 350 MG/ML injection. Multiplanar reformatted images are provided for review. MIP images are provided for review. Automated exposure control, iterative reconstruction, and/or weight-based adjustment of the mA/kV was utilized to reduce the radiation dose to as low as reasonably achievable. COMPARISON: Comparison chest radiograph from earlier today. CLINICAL HISTORY: Pulmonary embolism (PE) suspected, high probability. The patient noticed increased shortness of breath (SOB) 3-4 days ago, associated with centralized chest tightness and fatigue with exertion. No cough. FINDINGS: PULMONARY ARTERIES: Pulmonary arteries are adequately opacified for evaluation. No evidence of pulmonary embolism. MEDIASTINUM:  The heart and pericardium demonstrate no acute abnormality. There is a trace pericardial effusion. The left anterior descending coronary artery shows atherosclerosis. LYMPH NODES: No evidence of mediastinal, hilar, or axillary lymphadenopathy. LUNGS AND PLEURA: The lungs are without acute process. No focal consolidation or pulmonary edema. No evidence of pleural effusion or pneumothorax. Two tiny right upper lobe pulmonary nodules are present, the largest measuring 2 mm peripherally, seen on series 12, image 61. UPPER ABDOMEN: Limited images of the upper abdomen are unremarkable. SOFT TISSUES AND BONES: Prominent gynecomastia is noted, asymmetric to the right. Moderate multilevel degenerative disc disease is present in the thoracic spine. No acute bone or soft tissue abnormality. Incidental adrenal and/or renal findings do not require follow up imaging. The thoracic aorta is atherosclerotic, with a 4.6 mm ascending aortic aneurysm. IMPRESSION: 1. No evidence of pulmonary embolism or acute pulmonary abnormality. 2. Atherosclerotic thoracic aorta with 4.6 mm ascending aortic aneurysm. Follow-up chest CT in 6 months and vascular  surgery consultation advised. 3. Trace pericardial effusion. 4. Left anterior descending coronary atherosclerosis. 5. Two tiny right upper lobe pulmonary nodules, largest 2 mm. Follow up chest CT in 12 months may be considered if the patient has risk factors for malignancy, otherwise no follow-up recommended per Fleischner society criteria. 6. Prominent gynecomastia, asymmetric to the right. Electronically signed by: Karlyn Overman MD 02/09/2024 11:10 AM EDT RP Workstation: [id number]   DG Chest 2 View Result Date: 02/09/2024 CLINICAL DATA:  Increasing shortness of breath x4 days. EXAM: CHEST - 2 VIEW COMPARISON:  PA and lateral chest 02/23/2018. FINDINGS: The cardiac size is normal. Aorta is tortuous and ectatic but unchanged with stable mediastinal configuration, aortic atherosclerosis. The lungs are clear. No pleural effusion is seen. Degenerative disc disease thoracic spine with no acute osseous findings. Slight lumbar levoscoliosis. IMPRESSION: No evidence of acute chest disease. Aortic atherosclerosis. Electronically Signed   By: Denman Fischer M.D.   On: 02/09/2024 06:37    Procedures Procedures   Medications Ordered in ED Medications  furosemide (LASIX) tablet 20 mg (20 mg Oral Given 02/09/24 0754)  iohexol (OMNIPAQUE) 350 MG/ML injection 75 mL (75 mLs Intravenous Contrast Given 02/09/24 1006)    ED Course/ Medical Decision Making/ A&P Clinical Course as of 02/09/24 1330  Tue Feb 09, 2024  1120 Heart score 7 [CG]    Clinical Course User Index [CG] Adel Aden, PA-C   Medical Decision Making Amount and/or Complexity of Data Reviewed Radiology: ordered.  Risk Prescription drug management.   88 year old male presents for evaluation.  Please see HPI for further details.  On examination the patient is afebrile and nontachycardic.  Patient did arrive with a heart rate of 103 however this normalized.  Lung sounds are clear bilaterally, not hypoxic.  Abdomen soft and compressible.   Neurological examination is baseline.  Minimal nonpitting 1+ edema to bilateral lower extremities.  Overall nontoxic in appearance.  Work patient up utilizing CBC, BMP, BNP, troponin x 2, chest x-ray, EKG.  CBC without leukocytosis or anemia.  Metabolic panel with sodium, potassium and chloride WNL.  CO2 decreased 20.  BUN 26.  Anion gap 10.  Patient initial troponin 24, delta 31.  BNP elevated to 417.2.  Chest x-ray unremarkable.  Patient given Lasix this time.  Will collect CTA to rule out PE and to further characterize lungs.  Patient reports diuresing well.  CTA shows no evidence of PE.  There is a noted ascending aortic aneurysm.  Also noted to have trace pericardial effusion.  At this time, discussed patient with attending Dr. Lewis Red.  Will refer patient to vascular surgery for ascending aortic aneurysm.  Will send the patient to cardiology for outpatient workup, he appears stable to go home, he is not hypoxic.  I did call Trish with [facility] to establish an outpatient appointment for the patient.  Laveta Pottier states that the patient will receive a call this week from their office at appointment.  Will send patient home with 20 mg of Lasix and potassium supplementation.  Advised patient to take Lasix as needed as needed for leg swelling and orthopnea.  He voiced understanding.  He had all of his questions answered his satisfaction.  He stable to discharge home.   Final Clinical Impression(s) / ED Diagnoses Final diagnoses:  Aneurysm of ascending aorta without rupture (HCC)  Orthopnea  Chest pain, unspecified type    Rx / DC Orders ED Discharge Orders          Ordered    Ambulatory referral to Cardiology        02/09/24 1328    furosemide (LASIX) 20 MG tablet  As needed        02/09/24 1329    potassium chloride  SA (KLOR-CON  M) 20 MEQ tablet  As needed        02/09/24 1329              Kristin Peyer 02/09/24 1330    Deatra Face, MD 02/09/24  1523

## 2024-02-17 ENCOUNTER — Ambulatory Visit: Attending: Cardiology | Admitting: Cardiology

## 2024-02-17 ENCOUNTER — Other Ambulatory Visit: Payer: Self-pay | Admitting: Cardiology

## 2024-02-17 VITALS — BP 116/78 | HR 88 | Ht 73.0 in | Wt 165.0 lb

## 2024-02-17 DIAGNOSIS — I10 Essential (primary) hypertension: Secondary | ICD-10-CM | POA: Insufficient documentation

## 2024-02-17 DIAGNOSIS — R072 Precordial pain: Secondary | ICD-10-CM | POA: Insufficient documentation

## 2024-02-17 DIAGNOSIS — R079 Chest pain, unspecified: Secondary | ICD-10-CM | POA: Insufficient documentation

## 2024-02-17 DIAGNOSIS — I5042 Chronic combined systolic (congestive) and diastolic (congestive) heart failure: Secondary | ICD-10-CM | POA: Insufficient documentation

## 2024-02-17 DIAGNOSIS — I7121 Aneurysm of the ascending aorta, without rupture: Secondary | ICD-10-CM | POA: Diagnosis present

## 2024-02-17 DIAGNOSIS — E785 Hyperlipidemia, unspecified: Secondary | ICD-10-CM | POA: Insufficient documentation

## 2024-02-17 DIAGNOSIS — E118 Type 2 diabetes mellitus with unspecified complications: Secondary | ICD-10-CM | POA: Insufficient documentation

## 2024-02-17 MED ORDER — METOPROLOL TARTRATE 50 MG PO TABS: 50.0000 mg | ORAL_TABLET | Freq: Once | ORAL | 3 refills | Status: DC

## 2024-02-17 MED ORDER — METOPROLOL SUCCINATE ER 25 MG PO TB24: 25.0000 mg | ORAL_TABLET | Freq: Every day | ORAL | 3 refills | Status: DC

## 2024-02-17 MED ORDER — POTASSIUM CHLORIDE CRYS ER 20 MEQ PO TBCR: 20.0000 meq | EXTENDED_RELEASE_TABLET | ORAL | 3 refills | Status: DC | PRN

## 2024-02-17 MED ORDER — FUROSEMIDE 20 MG PO TABS: 20.0000 mg | ORAL_TABLET | ORAL | 3 refills | Status: DC | PRN

## 2024-02-17 NOTE — Progress Notes (Signed)
 Cardiology Office Note:    Date:  02/17/2024  ID:  Theodore Frank, DOB 05/01/1936, MRN [id number]  PCP:  Rosalea Collin, Julian Obey, MD  Cardiologist:  None  Electrophysiologist:  None   Referring MD: Alys Bacca*   Chief Complaint  Patient presents with   Chest Pain    History of Present Illness:    Theodore Frank is a 88 y.o. male with a hx of heart failure with reduced ejection fraction, colon cancer, T2DM, HTN, HLD, CKD III, COPD who presents as an ED follow-up for chest pain.  He was seen in ED 01/2024 with chest pain and dyspnea.  Troponin 24>31.  BNP 417.  CTA chest showed no PE, ascending aorta aneurysm measuring 46mm, LAD calcifications, 2 small pulmonary nodules.  Echocardiogram 08/2021 showed EF 35-40%, normal RV function, mild to moderate mitral regurgitation.  Does not appear that any for this.  He reports he is having chest pain that he describes as tightness in chest.  Was occurring 3 to 4 days prior to his ED visit.  Describes left-sided pressure that would wake him up at night.  Denies any exertional chest pain but reports he has not been exercising much.  Does report some shortness of breath.  He denies any lightheadedness or syncope.  Does report some lower extremity edema.  He smoked for 10 years, quit in mid 20s.  Family history includes mother had pacemaker and brother had ICD but he does not know details    Wt Readings from Last 3 Encounters:  02/17/24 165 lb (74.8 kg)  02/03/22 167 lb 12.8 oz (76.1 kg)  01/28/21 172 lb 6 oz (78.2 kg)     Past Medical History:  Diagnosis Date   Acute encephalopathy 04/23/2016   Anemia    Arthritis    Cancer (HCC) 10/14/1999   colon cancer (surgery; no chemo; no radiation)   Diabetes mellitus    Hyperlipidemia    Hypertension    Prostate cancer (HCC) 10/13/1192   s/p prostatectomy   Right inguinal hernia 02/25/2012   Shortness of breath dyspnea    with exertion    Past Surgical History:  Procedure  Laterality Date   COLON SURGERY  2001   colon resection for colon cancer   HERNIA REPAIR Right    INGUINAL HERNIA REPAIR  03/05/2012   Procedure: HERNIA REPAIR INGUINAL ADULT;  Surgeon: Cloyce Darby, MD;  Location:  S[facility]ERY [facility];  Service: General;  Laterality: Right;  Repair right inguinal hernia with mesh   LUMBAR LAMINECTOMY/DECOMPRESSION MICRODISCECTOMY Left 11/06/2014   Procedure: LEFT LUMBAR THREE-FOUR LAMINECTOMY/DECOMPRESSION MICRODISCECTOMY 1 LEVEL;  Surgeon: Ferris Hua, MD;  Location: [facility];  Service: Neurosurgery;  Laterality: Left;   PROSTATE SURGERY     prostatectomy for prostate cancer    Current Medications: Current Meds  Medication Sig   amLODipine  (NORVASC ) 10 MG tablet Take 1 tablet (10 mg total) by mouth daily.   atorvastatin  (LIPITOR) 20 MG tablet Take 1 tablet (20 mg total) by mouth daily.   ferrous sulfate 325 (65 FE) MG EC tablet Take 650 mg by mouth daily at 6 (six) AM.   furosemide  (LASIX ) 20 MG tablet Take 1 tablet (20 mg total) by mouth as needed. as needed for edema (As needed for shortness of breath at night while laying flat or leg swelling).   lisinopril  (ZESTRIL ) 10 MG tablet Take 10 mg by mouth daily.   metoprolol succinate (TOPROL XL) 25 MG 24 hr tablet Take 1  tablet (25 mg total) by mouth daily.   potassium chloride  SA (KLOR-CON  M) 20 MEQ tablet Take 1 tablet (20 mEq total) by mouth as needed. (Take 1 tablet of potassium anytime that you take 20mg  of lasix ).   vitamin B-12 (CYANOCOBALAMIN) 1000 MCG tablet Take 1 tablet (1,000 mcg total) by mouth daily.   [DISCONTINUED] furosemide  (LASIX ) 20 MG tablet Take 1 tablet (20 mg total) by mouth as needed for edema (As needed for shortness of breath at night while laying flat or leg swelling).   [DISCONTINUED] gabapentin  (NEURONTIN ) 100 MG capsule Take 1-2 capsules (100-200 mg total) by mouth at bedtime as needed.   [DISCONTINUED] metFORMIN  (GLUCOPHAGE ) 500 MG tablet TAKE 1 TABLET BY MOUTH   TWICE DAILY WITH A MEAL   [DISCONTINUED] metoprolol tartrate (LOPRESSOR) 50 MG tablet Take 1 tablet (50 mg total) by mouth once for 1 dose. 2 hours before CT scan   [DISCONTINUED] potassium chloride  SA (KLOR-CON  M) 20 MEQ tablet Take 1 tablet (20 mEq total) by mouth as needed (Take 2 tablet of potassium anytime that you take 20mg  of lasix ).     Allergies:   Patient has no known allergies.   Social History   Socioeconomic History   Marital status: Divorced    Spouse name: Not on file   Number of children: 4   Years of education: 12   Highest education level: Not on file  Occupational History   Occupation: Research scientist (physical sciences)  Tobacco Use   Smoking status: Former    Current packs/day: 0.00    Types: Cigarettes    Quit date: 10/13/1965    Years since quitting: 58.3   Smokeless tobacco: Never  Vaping Use   Vaping status: Never Used  Substance and Sexual Activity   Alcohol use: No   Drug use: No   Sexual activity: Yes    Birth control/protection: None  Other Topics Concern   Not on file  Social History Narrative   Marital status: divorced; single in 2018; not dating      Children: 4 total children; 1 son died of AIDS; 3 living children       Live:Lives alone      Employment: truck Hospital doctor; Loss adjuster, chartered; Psychologist, clinical; local      Tobacco: previous smoker; quit in 1967      Alcohol: none      Exercise: none; scared to exercise      ADLs: independent with ADLs; drives      Advanced Directives:  None; desires FULL CODE         Right-handed   Only occasional coffee or hot tea   Social Drivers of Corporate investment banker Strain: Low Risk  (11/17/2017)   Overall Financial Resource Strain (CARDIA)    Difficulty of Paying Living Expenses: Not hard at all  Food Insecurity: No Food Insecurity (11/17/2017)   Hunger Vital Sign    Worried About Running Out of Food in the Last Year: Never true    Ran Out of Food in the Last Year: Never true  Transportation Needs: No Transportation Needs  (11/17/2017)   PRAPARE - Administrator, Civil Service (Medical): No    Lack of Transportation (Non-Medical): No  Physical Activity: Inactive (11/17/2017)   Exercise Vital Sign    Days of Exercise per Week: 0 days    Minutes of Exercise per Session: 0 min  Stress: No Stress Concern Present (11/17/2017)   [employer] of Occupational Health - Occupational  Stress Questionnaire    Feeling of Stress : Not at all  Social Connections: Unknown (02/21/2022)   Received from [facility]   Social Network    Social Network: Not on file     Family History: The patient's family history includes Diabetes in an other family member; Hypertension in his mother; Thyroid  disease in his sister.  ROS:   Please see the history of present illness.     All other systems reviewed and are negative.  EKGs/Labs/Other Studies Reviewed:    The following studies were reviewed today:   EKG:   02/17/2024: Normal sinus rhythm, LVH with repolarization abnormalities, rate 88   Recent Labs: 02/09/2024: B Natriuretic Peptide 417.2; BUN 26; Creatinine, Ser 1.22; Hemoglobin 15.1; Platelets 155; Potassium 4.3; Sodium 138  Recent Lipid Panel    Component Value Date/Time   CHOL 137 05/28/2020 0920   TRIG 64 05/28/2020 0920   HDL 56 05/28/2020 0920   CHOLHDL 2.4 05/28/2020 0920   CHOLHDL 3.5 02/29/2016 0959   VLDL 18 02/29/2016 0959   LDLCALC 68 05/28/2020 0920    Physical Exam:    VS:  BP 116/78 (BP Location: Right Arm)   Pulse 88   Ht 6\' 1"  (1.854 m)   Wt 165 lb (74.8 kg)   SpO2 94%   BMI 21.77 kg/m     Wt Readings from Last 3 Encounters:  02/17/24 165 lb (74.8 kg)  02/03/22 167 lb 12.8 oz (76.1 kg)  01/28/21 172 lb 6 oz (78.2 kg)     GEN:  Well nourished, well developed in no acute distress HEENT: Normal NECK: No JVD; No carotid bruits LYMPHATICS: No lymphadenopathy CARDIAC: RRR, no murmurs, rubs, gallops RESPIRATORY:  Clear to auscultation without rales, wheezing or  rhonchi  ABDOMEN: Soft, non-tender, non-distended MUSCULOSKELETAL:  No edema; No deformity  SKIN: Warm and dry NEUROLOGIC:  Alert and oriented x 3 PSYCHIATRIC:  Normal affect   ASSESSMENT:    1. Chest pain of uncertain etiology   2. Precordial pain   3. Chronic combined systolic and diastolic heart failure (HCC)   4. Aneurysm of ascending aorta without rupture (HCC)   5. Type 2 diabetes mellitus with complication, without long-term current use of insulin  (HCC)   6. Essential hypertension   7. Hyperlipidemia, unspecified hyperlipidemia type    PLAN:    Chest pain/dyspnea: Reports atypical chest pain but also having dyspnea on exertion that could represent anginal equivalent.  Given reduced systolic function as below, warrants ischemic evaluation.  Mild coronary calcifications on recent CT chest.  Recommend coronary CTA, will give Lopressor 50 mg prior to study  Chronic combined heart failure: Echocardiogram 08/2021 showed EF 35-40%, normal RV function, mild to moderate mitral regurgitation.  Does not appear he has not had heart failure follow-up -Coronary CTA to evaluate for ischemic cardiomyopathy as above -On as needed lasix  20 mg.  Recommend monitoring daily weights and can take Lasix  20 mg as needed if gains more than 3 pounds in 1 day or 5 pounds in 1 week - Continue lisinopril  5 mg daily - Add Toprol-XL 25 mg daily - Update echocardiogram  Ascending aortic aneurysm: Measured 46 mm on CTA 01/2024.  Will follow  Hypertension: on amlodipine  10 mg daily and lisinopril  10 mg daily.  Appears controlled  Hyperlipidemia: on atorvastatin  20 mg daily.  Will check lipid panel  T2DM: on metformin .  Will check A1c  RTC in 3 months   Medication Adjustments/Labs and Tests Ordered: Current medicines  are reviewed at length with the patient today.  Concerns regarding medicines are outlined above.  Orders Placed This Encounter  Procedures   CT CORONARY MORPH W/CTA COR W/SCORE W/CA W/CM  &/OR WO/CM   Basic Metabolic Panel (BMET)   Magnesium   Lipid panel   HgB A1c   EKG 12-Lead   ECHOCARDIOGRAM COMPLETE   Meds ordered this encounter  Medications   metoprolol succinate (TOPROL XL) 25 MG 24 hr tablet    Sig: Take 1 tablet (25 mg total) by mouth daily.    Dispense:  90 tablet    Refill:  3   furosemide  (LASIX ) 20 MG tablet    Sig: Take 1 tablet (20 mg total) by mouth as needed. as needed for edema (As needed for shortness of breath at night while laying flat or leg swelling).    Dispense:  90 tablet    Refill:  3   potassium chloride  SA (KLOR-CON  M) 20 MEQ tablet    Sig: Take 1 tablet (20 mEq total) by mouth as needed. (Take 1 tablet of potassium anytime that you take 20mg  of lasix ).    Dispense:  90 tablet    Refill:  3   DISCONTD: metoprolol tartrate (LOPRESSOR) 50 MG tablet    Sig: Take 1 tablet (50 mg total) by mouth once for 1 dose. 2 hours before CT scan    Dispense:  180 tablet    Refill:  3    Patient Instructions  Medication Instructions:  - START metoprolol succinate (TOPROL XL) 25 mg daily.   ON DAY OF Coronary CT scan HOLD this medication and take lopressor.  - Lasix  20 mg take as needed for swelling, weight gain or 3 pounds overnight or 5 pounds in 1 week.  - Potassium Chloride  (KLORCON) 20 MEQ as needed, when taking lasix .    *If you need a refill on your cardiac medications before your next appointment, please call your pharmacy*   Lab Work: BMET  MAGNESIUM LIPID PANEL  HGA1C    If you have labs (blood work) drawn today and your tests are completely normal, you will receive your results only by: MyChart Message (if you have MyChart) OR A paper copy in the mail If you have any lab test that is abnormal or we need to change your treatment, we will call you to review the results.   Testing/Procedures: Your physician has requested that you have cardiac CT. Cardiac computed tomography (CT) is a painless test that uses an x-ray machine to  take clear, detailed pictures of your heart. For further information please visit [email or web address] Please follow instruction sheet as given.  Echo will be scheduled at [address]  Your physician has requested that you have an echocardiogram. Echocardiography is a painless test that uses sound waves to create images of your heart. It provides your doctor with information about the size and shape of your heart and how well your heart's chambers and valves are working. This procedure takes approximately one hour. There are no restrictions for this procedure. Please do NOT wear cologne, perfume, aftershave, or lotions (deodorant is allowed). Please arrive 15 minutes prior to your appointment time.  Follow-Up: At [facility], you and your health needs are our priority.  As part of our continuing mission to provide you with exceptional heart care, we have created designated Provider Care Teams.  These Care Teams include your primary Cardiologist (physician) and Advanced Practice Providers (APPs -  Physician Assistants and Nurse Practitioners)  who all work together to provide you with the care you need, when you need it.  We recommend signing up for the patient portal called "MyChart".  Sign up information is provided on this After Visit Summary.  MyChart is used to connect with patients for Virtual Visits (Telemedicine).  Patients are able to view lab/test results, encounter notes, upcoming appointments, etc.  Non-urgent messages can be sent to your provider as well.   To learn more about what you can do with MyChart, go to [email or web address].    Your next appointment:   3 month(s)  The format for your next appointment:   In Person  Provider:   Carson Clara, MD     Your cardiac CT will be scheduled at one of the below locations:   [facility] [address] [place], [place] [address] [phone]  OR  [facility] [address] [place], [place] [address] [phone]  OR   [facility] [address] [place], [place] [address] [phone]  OR   [facility] [address] [place], [place] [address] [phone]  OR   [facility] and Vascular Tower [address]  [place], [place] [address] Opening February 08, 2024  If scheduled at [facility], please arrive at the [facility] and Children's Entrance (Entrance C2) of [facility] 30 minutes prior to test start time. You can use the FREE valet parking offered at entrance C (encouraged to control the heart rate for the test)  Proceed to the [facility] Radiology Department (first floor) to check-in and test prep.   All radiology patients and guests should use entrance C2 at [facility], accessed from [facility], even though the hospital's physical address listed is [address]    If scheduled at the Heart and Vascular Tower at [address], please enter the parking lot using the [address] entrance and use the FREE valet service at the patient drop-off area. Enter the buidling and check-in with registration on the main floor.  If scheduled at [facility] or [facility], please arrive 15 mins early for check-in and test prep.  There is spacious parking and easy access to the radiology department from the [facility] Heart and Vascular entrance. Please enter here and check-in with the desk attendant.   If scheduled at [facility], please arrive 30 minutes early for check-in and test prep.  Please follow these instructions carefully (unless otherwise directed):  An IV will be required for this test and Nitroglycerin will be given.  Hold all erectile dysfunction medications at least 3 days (72 hrs) prior to test. (Ie viagra , cialis, sildenafil , tadalafil, etc)   On the Night  Before the Test: Be sure to Drink plenty of water. Do not consume any caffeinated/decaffeinated beverages or chocolate 12 hours prior to your test. Do not take any antihistamines 12 hours prior to your test.  On the Day of the Test: Drink plenty of water until 1 hour prior to the test. Do not eat any food 1 hour prior to test. You may take your regular medications prior to the test.  Take metoprolol (Lopressor) 50 mg two hours prior to test. If you take Furosemide  please HOLD on the morning of the test.  After the Test: Drink plenty of water. After receiving IV contrast, you may experience a mild flushed feeling. This is normal.  On occasion, you may experience a mild rash up to 24 hours after the test. This is not dangerous. If this occurs, you can take Benadryl 25 mg, Zyrtec, Claritin, or Allegra and increase your fluid intake. (Patients taking Tikosyn should avoid Benadryl, and may take Zyrtec, Claritin, or Allegra) If you experience trouble breathing, this can be serious. If it is severe call 911 IMMEDIATELY. If it is mild, please call our office.  We will call to schedule your test 2-4 weeks out understanding that some insurance companies will need an authorization prior to the service being performed.   For more information and frequently asked questions, please visit our website : [email or web address]  For non-scheduling related questions, please contact the cardiac imaging nurse navigator should you have any questions/concerns: Cardiac Imaging Nurse Navigators Direct Office Dial: [phone]   For scheduling needs, including cancellations and rescheduling, please call [place], [phone].      Signed, Wendie Hamburg, MD  02/17/2024 4:06 PM    [facility]

## 2024-02-17 NOTE — Patient Instructions (Addendum)
 Medication Instructions:  - START metoprolol succinate (TOPROL XL) 25 mg daily.   ON DAY OF Coronary CT scan HOLD this medication and take lopressor.  - Lasix  20 mg take as needed for swelling, weight gain or 3 pounds overnight or 5 pounds in 1 week.  - Potassium Chloride  (KLORCON) 20 MEQ as needed, when taking lasix .    *If you need a refill on your cardiac medications before your next appointment, please call your pharmacy*   Lab Work: BMET  MAGNESIUM LIPID PANEL  HGA1C    If you have labs (blood work) drawn today and your tests are completely normal, you will receive your results only by: MyChart Message (if you have MyChart) OR A paper copy in the mail If you have any lab test that is abnormal or we need to change your treatment, we will call you to review the results.   Testing/Procedures: Your physician has requested that you have cardiac CT. Cardiac computed tomography (CT) is a painless test that uses an x-ray machine to take clear, detailed pictures of your heart. For further information please visit [email or web address] Please follow instruction sheet as given.  Echo will be scheduled at [address]  Your physician has requested that you have an echocardiogram. Echocardiography is a painless test that uses sound waves to create images of your heart. It provides your doctor with information about the size and shape of your heart and how well your heart's chambers and valves are working. This procedure takes approximately one hour. There are no restrictions for this procedure. Please do NOT wear cologne, perfume, aftershave, or lotions (deodorant is allowed). Please arrive 15 minutes prior to your appointment time.  Follow-Up: At [facility], you and your health needs are our priority.  As part of our continuing mission to provide you with exceptional heart care, we have created designated Provider Care Teams.  These Care Teams include your primary Cardiologist  (physician) and Advanced Practice Providers (APPs -  Physician Assistants and Nurse Practitioners) who all work together to provide you with the care you need, when you need it.  We recommend signing up for the patient portal called "MyChart".  Sign up information is provided on this After Visit Summary.  MyChart is used to connect with patients for Virtual Visits (Telemedicine).  Patients are able to view lab/test results, encounter notes, upcoming appointments, etc.  Non-urgent messages can be sent to your provider as well.   To learn more about what you can do with MyChart, go to [email or web address].    Your next appointment:   3 month(s)  The format for your next appointment:   In Person  Provider:   Carson Clara, MD     Your cardiac CT will be scheduled at one of the below locations:   [facility] [address] [place], [place] [address] [phone]  OR  [facility] [address] [place], [place] [address] [phone]  OR   [facility] [address] [place], [place] [address] [phone]  OR   [facility] [address] [place], [place] [address] [phone]  OR   [facility] Mom[facility] and Vascular Tower [address]  [place], [place] [address] Opening February 08, 2024  If scheduled at [facility], please arrive at the [facility] and Children's Entrance (Entrance C2) of [facility] 30 minutes prior to test start time. You can use the FREE valet  parking offered at entrance C (encouraged to control the heart rate for the test)  Proceed to the [facility] Radiology Department (first floor) to check-in and test prep.   All radiology patients and guests should use entrance C2 at [facility], accessed from [facility], even though the hospital's physical address listed is [address]    If scheduled  at the Heart and Vascular Tower at [address], please enter the parking lot using the [address] entrance and use the FREE valet service at the patient drop-off area. Enter the buidling and check-in with registration on the main floor.  If scheduled at [facility] or [facility], please arrive 15 mins early for check-in and test prep.  There is spacious parking and easy access to the radiology department from the [facility] Heart and Vascular entrance. Please enter here and check-in with the desk attendant.   If scheduled at [facility], please arrive 30 minutes early for check-in and test prep.  Please follow these instructions carefully (unless otherwise directed):  An IV will be required for this test and Nitroglycerin will be given.  Hold all erectile dysfunction medications at least 3 days (72 hrs) prior to test. (Ie viagra , cialis, sildenafil , tadalafil, etc)   On the Night Before the Test: Be sure to Drink plenty of water. Do not consume any caffeinated/decaffeinated beverages or chocolate 12 hours prior to your test. Do not take any antihistamines 12 hours prior to your test.  On the Day of the Test: Drink plenty of water until 1 hour prior to the test. Do not eat any food 1 hour prior to test. You may take your regular medications prior to the test.  Take metoprolol (Lopressor) 50 mg two hours prior to test. If you take Furosemide  please HOLD on the morning of the test.  After the Test: Drink plenty of water. After receiving IV contrast, you may experience a mild flushed feeling. This is normal. On occasion, you may experience a mild rash up to 24 hours after the test. This is not dangerous. If this occurs, you can take Benadryl 25 mg, Zyrtec, Claritin, or Allegra and increase your fluid intake. (Patients taking Tikosyn should avoid Benadryl, and may take Zyrtec, Claritin, or Allegra) If you experience trouble  breathing, this can be serious. If it is severe call 911 IMMEDIATELY. If it is mild, please call our office.  We will call to schedule your test 2-4 weeks out understanding that some insurance companies will need an authorization prior to the service being performed.   For more information and frequently asked questions, please visit our website : [email or web address]  For non-scheduling related questions, please contact the cardiac imaging nurse navigator should you have any questions/concerns: Cardiac Imaging Nurse Navigators Direct Office Dial: [phone]   For scheduling needs, including cancellations and rescheduling, please call [place], [phone].

## 2024-02-18 LAB — LIPID PANEL

## 2024-02-19 ENCOUNTER — Telehealth: Payer: Self-pay | Admitting: *Deleted

## 2024-02-19 DIAGNOSIS — I10 Essential (primary) hypertension: Secondary | ICD-10-CM

## 2024-02-19 LAB — BASIC METABOLIC PANEL WITH GFR
BUN/Creatinine Ratio: 14 (ref 10–24)
BUN: 20 mg/dL (ref 8–27)
CO2: 22 mmol/L (ref 20–29)
Calcium: 11 mg/dL — ABNORMAL HIGH (ref 8.6–10.2)
Chloride: 105 mmol/L (ref 96–106)
Creatinine, Ser: 1.41 mg/dL — ABNORMAL HIGH (ref 0.76–1.27)
Glucose: 115 mg/dL — ABNORMAL HIGH (ref 70–99)
Potassium: 4.4 mmol/L (ref 3.5–5.2)
Sodium: 142 mmol/L (ref 134–144)
eGFR: 49 mL/min/{1.73_m2} — ABNORMAL LOW (ref >59)

## 2024-02-19 LAB — LIPID PANEL
Chol/HDL Ratio: 2.9 ratio (ref 0.0–5.0)
Cholesterol, Total: 164 mg/dL (ref 100–199)
HDL: 57 mg/dL (ref >39)
LDL Chol Calc (NIH): 88 mg/dL (ref 0–99)
Triglycerides: 107 mg/dL (ref 0–149)
VLDL Cholesterol Cal: 19 mg/dL (ref 5–40)

## 2024-02-19 LAB — HEMOGLOBIN A1C
Est. average glucose Bld gHb Est-mCnc: 123 mg/dL
Hgb A1c MFr Bld: 5.9 % — ABNORMAL HIGH (ref 4.8–5.6)

## 2024-02-19 LAB — MAGNESIUM: Magnesium: 2.1 mg/dL (ref 1.6–2.3)

## 2024-02-19 NOTE — Telephone Encounter (Signed)
 Called patient and made patient aware per Dr. Alda Amas mild worsening kidney function. Made patient aware that he should be taking Lasix  as needed. Made patient aware that he should  weigh daily and take lasix  as needed for weight gain great than 3 lbs in a day or 5 lbs in week. Made patient aware to only take Potassium supplements if he takes Lasix . Lab work order placed for [employer] in one week. Patient verbalized an understanding.

## 2024-02-26 ENCOUNTER — Other Ambulatory Visit: Payer: Self-pay

## 2024-02-26 DIAGNOSIS — I10 Essential (primary) hypertension: Secondary | ICD-10-CM

## 2024-02-26 LAB — BASIC METABOLIC PANEL WITH GFR
BUN/Creatinine Ratio: 13 (ref 10–24)
BUN: 18 mg/dL (ref 8–27)
CO2: 20 mmol/L (ref 20–29)
Calcium: 10.6 mg/dL — ABNORMAL HIGH (ref 8.6–10.2)
Chloride: 105 mmol/L (ref 96–106)
Creatinine, Ser: 1.35 mg/dL — ABNORMAL HIGH (ref 0.76–1.27)
Glucose: 126 mg/dL — ABNORMAL HIGH (ref 70–99)
Potassium: 3.9 mmol/L (ref 3.5–5.2)
Sodium: 142 mmol/L (ref 134–144)
eGFR: 51 mL/min/{1.73_m2} — ABNORMAL LOW

## 2024-02-28 ENCOUNTER — Ambulatory Visit: Payer: Self-pay | Admitting: Cardiology

## 2024-03-02 ENCOUNTER — Telehealth (HOSPITAL_COMMUNITY): Payer: Self-pay | Admitting: *Deleted

## 2024-03-02 NOTE — Telephone Encounter (Signed)
 Reaching out to patient to offer assistance regarding upcoming cardiac imaging study; pt verbalizes understanding of appt date/time, parking situation and where to check in, pre-test NPO status and medications ordered, and verified current allergies; name and call back number provided for further questions should they arise Johney Frame RN Navigator Cardiac Imaging [facility] [phone] office [phone] cell

## 2024-03-03 ENCOUNTER — Ambulatory Visit: Payer: Self-pay | Admitting: Cardiology

## 2024-03-03 ENCOUNTER — Ambulatory Visit (HOSPITAL_COMMUNITY)
Admission: RE | Admit: 2024-03-03 | Discharge: 2024-03-03 | Disposition: A | Discharge status: Home / Self Care | Source: Ambulatory Visit | Attending: Cardiology | Admitting: Cardiology

## 2024-03-03 DIAGNOSIS — R072 Precordial pain: Secondary | ICD-10-CM | POA: Insufficient documentation

## 2024-03-03 MED ORDER — IOHEXOL 350 MG/ML SOLN
100.0000 mL | Freq: Once | INTRAVENOUS | Status: AC | PRN
  Administered 2024-03-03: 100 mL via INTRAVENOUS

## 2024-03-03 MED ORDER — NITROGLYCERIN 0.4 MG SL SUBL
SUBLINGUAL_TABLET | SUBLINGUAL | Status: AC
  Filled 2024-03-03: qty 2

## 2024-03-03 MED ORDER — METOPROLOL TARTRATE 5 MG/5ML IV SOLN: 10.0000 mg | Freq: Once | INTRAVENOUS | Status: DC | PRN

## 2024-03-03 MED ORDER — NITROGLYCERIN 0.4 MG SL SUBL
0.8000 mg | SUBLINGUAL_TABLET | Freq: Once | SUBLINGUAL | Status: AC
  Administered 2024-03-03: 0.8 mg via SUBLINGUAL

## 2024-03-03 MED ORDER — DILTIAZEM HCL 25 MG/5ML IV SOLN: 10.0000 mg | INTRAVENOUS | Status: DC | PRN

## 2024-03-22 ENCOUNTER — Ambulatory Visit (HOSPITAL_COMMUNITY)
Admission: RE | Admit: 2024-03-22 | Discharge: 2024-03-22 | Disposition: A | Discharge status: Home / Self Care | Source: Ambulatory Visit | Attending: Cardiovascular Disease | Admitting: Cardiovascular Disease

## 2024-03-22 DIAGNOSIS — R079 Chest pain, unspecified: Secondary | ICD-10-CM

## 2024-03-22 LAB — ECHOCARDIOGRAM COMPLETE
Area-P 1/2: 3.48 cm2
Calc EF: 29.1 %
MV M vel: 5.07 m/s
MV Peak grad: 102.8 mmHg
Radius: 0.7 cm
S' Lateral: 5.8 cm
Single Plane A2C EF: 27.5 %
Single Plane A4C EF: 28.3 %

## 2024-04-05 NOTE — Progress Notes (Unsigned)
 Cardiology Office Note:    Date:  04/06/2024  ID:  Theodore Frank, DOB Aug 01, 1936, MRN [id number]  PCP:  Theodore Frank.  Cardiologist:  None  Electrophysiologist:  None   Referring MD: Theodore Frank   Chief Complaint  Patient presents with   Congestive Heart Failure    History of Present Illness:    Theodore Frank is a 88 y.o. male with a hx of heart failure with reduced ejection fraction, colon cancer, T2DM, HTN, HLD, CKD III, COPD who presents for follow-up.[name]  He was seen in ED 01/2024 with chest pain and dyspnea.  Troponin 24>31.  BNP 417.  CTA chest showed Theodore PE, ascending aorta aneurysm measuring 46mm, LAD calcifications, 2 small pulmonary nodules.  Echocardiogram 08/2021 showed EF 35-40%, normal RV function, mild to moderate mitral regurgitation.  Does not appear that he had any follow-up for this.  Seen for initial visit 02/17/2024.  Coronary CTA on 03/04/2024 showed mild nonobstructive CAD, calcium  score 79 (31st percentile).  Echocardiogram 03/22/2024 showed EF 25 to 30%, G2 DD, normal RV function, moderate mitral regurgitation, dilated ascending aorta measuring 43 mm.  Since last clinic visit, he reports he is doing okay.  He has been feeling more short of breath.  Reports occasional chest tightness.  Reports some lightheadedness but denies any syncope.  Does report having some lower extremity edema.  He denies any palpitations.   Wt Readings from Last 3 Encounters:  04/06/24 163 lb 6.4 oz (74.1 kg)  02/17/24 165 lb (74.8 kg)  02/03/22 167 lb 12.8 oz (76.1 kg)     Past Medical History:  Diagnosis Date   Acute encephalopathy 04/23/2016   Anemia    Arthritis    Cancer (HCC) 10/14/1999   colon cancer (surgery; Theodore chemo; Theodore radiation)   Diabetes mellitus    Hyperlipidemia    Hypertension    Prostate cancer (HCC) 10/13/1192   s/p prostatectomy   Right inguinal hernia 02/25/2012   Shortness of breath dyspnea    with exertion    Past Surgical  History:  Procedure Laterality Date   COLON SURGERY  2001   colon resection for colon cancer   HERNIA REPAIR Right    INGUINAL HERNIA REPAIR  03/05/2012   Procedure: HERNIA REPAIR INGUINAL ADULT;  Surgeon: Dann FORBES Hummer, MD;  Location:  SUR[facility]ERY [facility];  Service: General;  Laterality: Right;  Repair right inguinal hernia with mesh   LUMBAR LAMINECTOMY/DECOMPRESSION MICRODISCECTOMY Left 11/06/2014   Procedure: LEFT LUMBAR THREE-FOUR LAMINECTOMY/DECOMPRESSION MICRODISCECTOMY 1 LEVEL;  Surgeon: Arley SHAUNNA Helling, MD;  Location: [facility];  Service: Neurosurgery;  Laterality: Left;   PROSTATE SURGERY     prostatectomy for prostate cancer    Current Medications: Current Meds  Medication Sig   amLODipine  (NORVASC ) 10 MG tablet Take 1 tablet (10 mg total) by mouth daily.   atorvastatin  (LIPITOR) 20 MG tablet Take 1 tablet (20 mg total) by mouth daily.   empagliflozin (JARDIANCE) 10 MG TABS tablet Take 1 tablet (10 mg total) by mouth daily before breakfast.   ferrous sulfate 325 (65 FE) MG EC tablet Take 650 mg by mouth daily at 6 (six) AM.   furosemide  (LASIX ) 20 MG tablet Take 1 tablet (20 mg total) by mouth as needed. as needed for edema (As needed for shortness of breath at night while laying flat or leg swelling).   lisinopril  (ZESTRIL ) 10 MG tablet Take 10 mg by mouth daily.   metoprolol  succinate (TOPROL  XL)  25 MG 24 hr tablet Take 1 tablet (25 mg total) by mouth daily.   potassium chloride  SA (KLOR-CON  M) 20 MEQ tablet Take 1 tablet (20 mEq total) by mouth as needed. (Take 1 tablet of potassium anytime that you take 20mg  of lasix ).   vitamin B-12 (CYANOCOBALAMIN) 1000 MCG tablet Take 1 tablet (1,000 mcg total) by mouth daily.     Allergies:   Patient has Theodore known allergies.   Social History   Socioeconomic History   Marital status: Divorced    Spouse name: Not on Frank   Number of children: 4   Years of education: 12   Highest education level: Not on Frank  Occupational  History   Occupation: Research scientist (physical sciences)  Tobacco Use   Smoking status: Former    Current packs/day: 0.00    Types: Cigarettes    Quit date: 10/13/1965    Years since quitting: 58.5   Smokeless tobacco: Never  Vaping Use   Vaping status: Never Used  Substance and Sexual Activity   Alcohol use: Theodore   Drug use: Theodore   Sexual activity: Yes    Birth control/protection: None  Other Topics Concern   Not on Frank  Social History Narrative   Marital status: divorced; single in 2018; not dating      Children: 4 total children; 1 son died of AIDS; 3 living children       Live:Lives alone      Employment: truck Hospital doctor; Loss adjuster, chartered; Psychologist, clinical; local      Tobacco: previous smoker; quit in 1967      Alcohol: none      Exercise: none; scared to exercise      ADLs: independent with ADLs; drives      Advanced Directives:  None; desires FULL CODE         Right-handed   Only occasional coffee or hot tea   Social Drivers of Corporate investment banker Strain: Low Risk  (11/17/2017)   Overall Financial Resource Strain (CARDIA)    Difficulty of Paying Living Expenses: Not hard at all  Food Insecurity: Theodore Food Insecurity (11/17/2017)   Hunger Vital Sign    Worried About Running Out of Food in the Last Year: Never true    Ran Out of Food in the Last Year: Never true  Transportation Needs: Theodore Transportation Needs (11/17/2017)   PRAPARE - Administrator, Civil Service (Medical): Theodore    Lack of Transportation (Non-Medical): Theodore  Physical Activity: Inactive (11/17/2017)   Exercise Vital Sign    Days of Exercise per Week: 0 days    Minutes of Exercise per Session: 0 min  Stress: Theodore Stress Concern Present (11/17/2017)   [employer] of Occupational Health - Occupational Stress Questionnaire    Feeling of Stress : Not at all  Social Connections: Unknown (02/21/2022)   Received from [facility]   Social Network    Social Network: Not on Frank     Family History: The patient's family  history includes Diabetes in an other family member; Hypertension in his mother; Thyroid  disease in his sister.  ROS:   Please see the history of present illness.     All other systems reviewed and are negative.  EKGs/Labs/Other Studies Reviewed:    The following studies were reviewed today:   EKG:   02/17/2024: Normal sinus rhythm, LVH with repolarization abnormalities, rate 88   Recent Labs: 02/09/2024: B Natriuretic Peptide 417.2; Hemoglobin 15.1; Platelets 155 02/18/2024:  Magnesium 2.1 02/26/2024: BUN 18; Creatinine, Ser 1.35; Potassium 3.9; Sodium 142  Recent Lipid Panel    Component Value Date/Time   CHOL 164 02/18/2024 0810   TRIG 107 02/18/2024 0810   HDL 57 02/18/2024 0810   CHOLHDL 2.9 02/18/2024 0810   CHOLHDL 3.5 02/29/2016 0959   VLDL 18 02/29/2016 0959   LDLCALC 88 02/18/2024 0810    Physical Exam:    VS:  BP 124/74   Pulse 84   Ht 6' 1 (1.854 m)   Wt 163 lb 6.4 oz (74.1 kg)   SpO2 96%   BMI 21.56 kg/m     Wt Readings from Last 3 Encounters:  04/06/24 163 lb 6.4 oz (74.1 kg)  02/17/24 165 lb (74.8 kg)  02/03/22 167 lb 12.8 oz (76.1 kg)     GEN:  Well nourished, well developed in Theodore acute distress HEENT: Normal NECK: Theodore JVD; Theodore carotid bruits LYMPHATICS: Theodore lymphadenopathy CARDIAC: RRR, Theodore murmurs, rubs, gallops RESPIRATORY:  Clear to auscultation without rales, wheezing or rhonchi  ABDOMEN: Soft, non-tender, non-distended MUSCULOSKELETAL:  Theodore edema; Theodore deformity  SKIN: Warm and dry NEUROLOGIC:  Alert and oriented x 3 PSYCHIATRIC:  Normal affect   ASSESSMENT:    1. Chronic combined systolic and diastolic heart failure (HCC)   2. Nonischemic cardiomyopathy (HCC)   3. Essential hypertension   4. Coronary artery disease involving native coronary artery of native heart without angina pectoris   5. Hyperlipidemia, unspecified hyperlipidemia type     PLAN:    Chronic combined heart failure: Echocardiogram 08/2021 showed EF 35-40%, normal RV  function, mild to moderate mitral regurgitation.  Does not appear he has not had heart failure follow-up.  Coronary CTA on 03/04/2024 showed mild nonobstructive CAD, calcium  score 79 (31st percentile).  Echocardiogram 03/22/2024 showed EF 25 to 30%, G2 DD, normal RV function, moderate mitral regurgitation, dilated ascending aorta measuring 43 mm. -Cardiac MRI for further evaluation of nonischemic cardiomyopathy -Appears euvolemic.  On as needed lasix  20 mg.  Recommend monitoring daily weights and can take Lasix  20 mg as needed if gains more than 3 pounds in 1 day or 5 pounds in 1 week -Continue lisinopril  10 mg daily.   -Continue Toprol -XL 25 mg daily -Add Jardiance 10 mg daily.  Check BMET in 1 week -Recommend follow-up in pharmacy heart failure clinic in 2 weeks for further titration of GDMT.  CAD: Coronary CTA on 03/04/2024 showed mild nonobstructive CAD, calcium  score 79 (31st percentile).  -Continue atorvastatin   Ascending aortic aneurysm: Measured 46 mm on CTA 01/2024.  Will follow  Hypertension: on amlodipine  10 mg daily and lisinopril  10 mg daily and Toprol -XL 25 mg daily.  May need to wean amlodipine  off to allow further titration of GDMT  Hyperlipidemia: on atorvastatin  20 mg daily.  LDL 88 on 02/18/24  T2DM: on metformin .  A1c 5.9% on 02/18/24   RTC in 3 months   Medication Adjustments/Labs and Tests Ordered: Current medicines are reviewed at length with the patient today.  Concerns regarding medicines are outlined above.  Orders Placed This Encounter  Procedures   MR CARDIAC MORPHOLOGY W WO CONTRAST   Basic Metabolic Panel (BMET)   Magnesium   CBC   AMB Referral to Heartcare Pharm-D   Meds ordered this encounter  Medications   empagliflozin (JARDIANCE) 10 MG TABS tablet    Sig: Take 1 tablet (10 mg total) by mouth daily before breakfast.    Dispense:  90 tablet    Refill:  3  Patient Instructions  Medication Instructions:  Start Jardiance 10mg  as discussed with your  provider Continue all current medications *If you need a refill on your cardiac medications before your next appointment, please call your pharmacy*  Lab Work: Bmet, mg, cbc in one week If you have labs (blood work) drawn today and your tests are completely normal, you will receive your results only by: MyChart Message (if you have MyChart) OR A paper copy in the mail If you have any lab test that is abnormal or we need to change your treatment, we will call you to review the results.  Testing/Procedures:   You are scheduled for Cardiac MRI at the location below.  Please arrive for your appointment at ______________ . ?  [facility] [address] [place], [place] [address] Please take advantage of the free valet parking available at the [facility] and [employer] (Entrance C).  Proceed to the [facility] Radiology Department (First Floor) for check-in.   Magnetic resonance imaging (MRI) is a painless test that produces images of the inside of the body without using Xrays.  During an MRI, strong magnets and radio waves work together in a Data processing manager to form detailed images.   MRI images may provide more details about a medical condition than X-rays, CT scans, and ultrasounds can provide.  You may be given earphones to listen for instructions.  You may eat a light breakfast and take medications as ordered with the exception of furosemide , hydrochlorothiazide , chlorthalidone or spironolactone (or any other fluid pill). If you are undergoing a stress MRI, please avoid stimulants for 12 hr prior to test. (I.e. Caffeine, nicotine, chocolate, or antihistamine medications)  If your provider has ordered anti-anxiety medications for this test, then you will need a driver.  An IV will be inserted into one of your veins. Contrast material will be injected into your IV. It will leave your body through your urine within a day. You may be told to drink plenty of fluids to help  flush the contrast material out of your system.  You will be asked to remove all metal, including: Watch, jewelry, and other metal objects including hearing aids, hair pieces and dentures. Also wearable glucose monitoring systems (ie. Freestyle Libre and Omnipods) (Braces and fillings normally are not a problem.)   TEST WILL TAKE APPROXIMATELY 1 HOUR  PLEASE NOTIFY SCHEDULING AT LEAST 24 HOURS IN ADVANCE IF YOU ARE UNABLE TO KEEP YOUR APPOINTMENT. [phone]  For more information and frequently asked questions, please visit our website : [email or web address]  Please call the Cardiac Imaging Nurse Navigators with any questions/concerns. [phone] Office    Follow-Up: At [facility], you and your health needs are our priority.  As part of our continuing mission to provide you with exceptional heart care, our providers are all part of one team.  This team includes your primary Cardiologist (physician) and Advanced Practice Providers or APPs (Physician Assistants and Nurse Practitioners) who all work together to provide you with the care you need, when you need it.  Your next appointment:   3 month(s)  Provider:   Dr. Kate  We recommend signing up for the patient portal called MyChart.  Sign up information is provided on this After Visit Summary.  MyChart is used to connect with patients for Virtual Visits (Telemedicine).  Patients are able to view lab/test results, encounter notes, upcoming appointments, etc.  Non-urgent messages can be sent to your provider as well.   To learn more about  what you can do with MyChart, go to [email or web address].   Other Instructions Pharm D please schedule to be seen in 2 weeks       Signed, Lonni LITTIE Nanas, MD  04/06/2024 11:25 AM    [facility]

## 2024-04-06 ENCOUNTER — Encounter: Payer: Self-pay | Admitting: Cardiology

## 2024-04-06 ENCOUNTER — Ambulatory Visit: Attending: Cardiology | Admitting: Cardiology

## 2024-04-06 VITALS — BP 124/74 | HR 84 | Ht 73.0 in | Wt 163.4 lb

## 2024-04-06 DIAGNOSIS — I2510 Atherosclerotic heart disease of native coronary artery without angina pectoris: Secondary | ICD-10-CM | POA: Diagnosis present

## 2024-04-06 DIAGNOSIS — I5042 Chronic combined systolic (congestive) and diastolic (congestive) heart failure: Secondary | ICD-10-CM | POA: Insufficient documentation

## 2024-04-06 DIAGNOSIS — E785 Hyperlipidemia, unspecified: Secondary | ICD-10-CM | POA: Insufficient documentation

## 2024-04-06 DIAGNOSIS — I10 Essential (primary) hypertension: Secondary | ICD-10-CM | POA: Diagnosis present

## 2024-04-06 DIAGNOSIS — I428 Other cardiomyopathies: Secondary | ICD-10-CM | POA: Diagnosis present

## 2024-04-06 MED ORDER — EMPAGLIFLOZIN 10 MG PO TABS
10.0000 mg | ORAL_TABLET | Freq: Every day | ORAL | 3 refills | Status: DC
  Filled 2024-04-21 – 2024-07-03 (×2): qty 90, 90d supply, fill #0

## 2024-04-06 NOTE — Patient Instructions (Addendum)
 Medication Instructions:  Start Jardiance 10mg  as discussed with your provider Continue all current medications *If you need a refill on your cardiac medications before your next appointment, please call your pharmacy*  Lab Work: Bmet, mg, cbc in one week If you have labs (blood work) drawn today and your tests are completely normal, you will receive your results only by: MyChart Message (if you have MyChart) OR A paper copy in the mail If you have any lab test that is abnormal or we need to change your treatment, we will call you to review the results.  Testing/Procedures:   You are scheduled for Cardiac MRI at the location below.  Please arrive for your appointment at ______________ . ?  [facility] [address] [place], [place] [address] Please take advantage of the free valet parking available at the [facility] and [employer] (Entrance C).  Proceed to the [facility] Radiology Department (First Floor) for check-in.   Magnetic resonance imaging (MRI) is a painless test that produces images of the inside of the body without using Xrays.  During an MRI, strong magnets and radio waves work together in a Data processing manager to form detailed images.   MRI images may provide more details about a medical condition than X-rays, CT scans, and ultrasounds can provide.  You may be given earphones to listen for instructions.  You may eat a light breakfast and take medications as ordered with the exception of furosemide , hydrochlorothiazide , chlorthalidone or spironolactone (or any other fluid pill). If you are undergoing a stress MRI, please avoid stimulants for 12 hr prior to test. (I.e. Caffeine, nicotine, chocolate, or antihistamine medications)  If your provider has ordered anti-anxiety medications for this test, then you will need a driver.  An IV will be inserted into one of your veins. Contrast material will be injected into your IV. It will leave your body through your  urine within a day. You may be told to drink plenty of fluids to help flush the contrast material out of your system.  You will be asked to remove all metal, including: Watch, jewelry, and other metal objects including hearing aids, hair pieces and dentures. Also wearable glucose monitoring systems (ie. Freestyle Libre and Omnipods) (Braces and fillings normally are not a problem.)   TEST WILL TAKE APPROXIMATELY 1 HOUR  PLEASE NOTIFY SCHEDULING AT LEAST 24 HOURS IN ADVANCE IF YOU ARE UNABLE TO KEEP YOUR APPOINTMENT. [phone]  For more information and frequently asked questions, please visit our website : [email or web address]  Please call the Cardiac Imaging Nurse Navigators with any questions/concerns. [phone] Office    Follow-Up: At [facility], you and your health needs are our priority.  As part of our continuing mission to provide you with exceptional heart care, our providers are all part of one team.  This team includes your primary Cardiologist (physician) and Advanced Practice Providers or APPs (Physician Assistants and Nurse Practitioners) who all work together to provide you with the care you need, when you need it.  Your next appointment:   3 month(s)  Provider:   Dr. Kate  We recommend signing up for the patient portal called MyChart.  Sign up information is provided on this After Visit Summary.  MyChart is used to connect with patients for Virtual Visits (Telemedicine).  Patients are able to view lab/test results, encounter notes, upcoming appointments, etc.  Non-urgent messages can be sent to your provider as well.   To learn more about what you can do  with MyChart, go to [email or web address].   Other Instructions Pharm D please schedule to be seen in 2 weeks

## 2024-04-07 ENCOUNTER — Encounter (HOSPITAL_COMMUNITY): Payer: Self-pay

## 2024-04-11 ENCOUNTER — Other Ambulatory Visit: Payer: Self-pay | Admitting: Cardiology

## 2024-04-11 ENCOUNTER — Ambulatory Visit (HOSPITAL_COMMUNITY)
Admission: RE | Admit: 2024-04-11 | Discharge: 2024-04-11 | Disposition: A | Discharge status: Home / Self Care | Source: Ambulatory Visit | Attending: Cardiology | Admitting: Cardiology

## 2024-04-11 ENCOUNTER — Ambulatory Visit: Payer: Self-pay | Admitting: Cardiology

## 2024-04-11 DIAGNOSIS — I428 Other cardiomyopathies: Secondary | ICD-10-CM

## 2024-04-11 DIAGNOSIS — D869 Sarcoidosis, unspecified: Secondary | ICD-10-CM

## 2024-04-11 DIAGNOSIS — B3322 Viral myocarditis: Secondary | ICD-10-CM

## 2024-04-11 MED ORDER — GADOBUTROL 1 MMOL/ML IV SOLN
8.0000 mL | Freq: Once | INTRAVENOUS | Status: AC | PRN
  Administered 2024-04-11: 8 mL via INTRAVENOUS

## 2024-04-12 ENCOUNTER — Other Ambulatory Visit: Payer: Self-pay

## 2024-04-12 DIAGNOSIS — I10 Essential (primary) hypertension: Secondary | ICD-10-CM

## 2024-04-12 LAB — CBC

## 2024-04-13 LAB — BASIC METABOLIC PANEL WITH GFR
BUN/Creatinine Ratio: 16 (ref 10–24)
BUN: 23 mg/dL (ref 8–27)
CO2: 20 mmol/L (ref 20–29)
Calcium: 10.6 mg/dL — ABNORMAL HIGH (ref 8.6–10.2)
Chloride: 106 mmol/L (ref 96–106)
Creatinine, Ser: 1.43 mg/dL — ABNORMAL HIGH (ref 0.76–1.27)
Glucose: 186 mg/dL — ABNORMAL HIGH (ref 70–99)
Potassium: 4 mmol/L (ref 3.5–5.2)
Sodium: 141 mmol/L (ref 134–144)
eGFR: 48 mL/min/{1.73_m2} — ABNORMAL LOW (ref >59)

## 2024-04-13 LAB — CBC
Hematocrit: 43.3 (ref 37.5–51.0)
Hemoglobin: 14.3 g/dL (ref 13.0–17.7)
MCH: 32.5 pg (ref 26.6–33.0)
MCHC: 33 g/dL (ref 31.5–35.7)
MCV: 98 fL — AB (ref 79–97)
Platelets: 179 10*3/uL (ref 150–450)
RBC: 4.4 x10E6/uL (ref 4.14–5.80)
RDW: 12.3 (ref 11.6–15.4)
WBC: 5.4 x10E3/uL (ref 3.4–10.8)

## 2024-04-13 LAB — MAGNESIUM: Magnesium: 2.2 mg/dL (ref 1.6–2.3)

## 2024-04-18 NOTE — Telephone Encounter (Signed)
 Called and made patient aware that per Dr. Kate recommends recommend FDG PET scan to evaluate for sarcoidosis in the heart . Order place and made patient aware that someone will call to set up procedure. Understanding verbalized.

## 2024-04-21 ENCOUNTER — Ambulatory Visit: Attending: Cardiology | Admitting: Pharmacist Clinician (PhC)/ Clinical Pharmacy Specialist

## 2024-04-21 ENCOUNTER — Encounter: Payer: Self-pay | Admitting: Pharmacist Clinician (PhC)/ Clinical Pharmacy Specialist

## 2024-04-21 ENCOUNTER — Other Ambulatory Visit (HOSPITAL_COMMUNITY): Payer: Self-pay

## 2024-04-21 VITALS — BP 120/80 | HR 76

## 2024-04-21 DIAGNOSIS — I5020 Unspecified systolic (congestive) heart failure: Secondary | ICD-10-CM | POA: Diagnosis present

## 2024-04-21 MED ORDER — SACUBITRIL-VALSARTAN 49-51 MG PO TABS
1.0000 | ORAL_TABLET | Freq: Two times a day (BID) | ORAL | 1 refills | Status: DC
  Filled 2024-04-21: qty 60, 30d supply, fill #0
  Filled 2024-05-31: qty 60, 30d supply, fill #1

## 2024-04-21 NOTE — Addendum Note (Signed)
 Addended by: Rosalind Guido L on: 04/21/2024 04:35 PM   Modules accepted: Orders

## 2024-04-21 NOTE — Assessment & Plan Note (Signed)
 Assessment: BP in office today is 120/80 Patient with HFrEF   Tolerates current medications without any side effects  (metoprolol  succ 25 mg daily, empagliflozin  10 mg daily Denies SOB, palpitation, chest pain, headaches,or edema Concerns with regards to cost of empagliflozin ; mostly compliant, but takes meds at varying times of day;  no concern with ability to pick up at pharmacy Reiterated the importance of regular exercise and low salt diet  Plan: GDMT ACEI/ARB/ARNI No lisinopril  in > 1 month - discontinue Start Entresto  49/51 mg bid  Beta blocker  Metoprolol  succ 25 mg daily  MRA Will start at next visit   SGLT2  Empagliflozin  10 mg daily   Labs orderd:  BMET at next visit Stop amlodipine  10 mg Follow up with PharmD  in 3 weeks

## 2024-04-21 NOTE — Patient Instructions (Signed)
 Follow up appointment: Monday July 28 at 8:45 am  Take your meds as follows:  AM:  Entresto  49/51 mg, Jardiance  10 mg,  iron 2 tabs,   PM:  Entresto  49/51 mg, metoprolol  succ 25 mg, atorvastatin  20 mg    Take furosemide  (with potassium supplement) 20 mg for swelling  STOP AMLODIPINE  10 MG STOP LISINOPRIL  10 MG  Check your blood pressure at home daily (if able) and keep record of the readings.  Your blood pressure goal is < 120/80  To check your pressure at home you will need to:  1. Sit up in a chair, with feet flat on the floor and back supported. Do not cross your ankles or legs. 2. Rest your left arm so that the cuff is about heart level. If the cuff goes on your upper arm,  then just relax the arm on the table, arm of the chair or your lap. If you have a wrist cuff, we  suggest relaxing your wrist against your chest (think of it as Pledging the Flag with the  wrong arm).  3. Place the cuff snugly around your arm, about 1 inch above the crook of your elbow. The  cords should be inside the groove of your elbow.  4. Sit quietly, with the cuff in place, for about 5 minutes. After that 5 minutes press the power  button to start a reading. 5. Do not talk or move while the reading is taking place.  6. Record your readings on a sheet of paper. Although most cuffs have a memory, it is often  easier to see a pattern developing when the numbers are all in front of you.  7. You can repeat the reading after 1-3 minutes if it is recommended  Make sure your bladder is empty and you have not had caffeine or tobacco within the last 30 min  Always bring your blood pressure log with you to your appointments. If you have not brought your monitor in to be double checked for accuracy, please bring it to your next appointment.  You can find a list of quality blood pressure cuffs at WirelessNovelties.no  Important lifestyle changes to control high blood pressure  Intervention  Effect on the BP  Lose  extra pounds and watch your waistline Weight loss is one of the most effective lifestyle changes for controlling blood pressure. If you're overweight or obese, losing even a small amount of weight can help reduce blood pressure. Blood pressure might go down by about 1 millimeter of mercury (mm Hg) with each kilogram (about 2.2 pounds) of weight lost.  Exercise regularly As a general goal, aim for at least 30 minutes of moderate physical activity every day. Regular physical activity can lower high blood pressure by about 5 to 8 mm Hg.  Eat a healthy diet Eating a diet rich in whole grains, fruits, vegetables, and low-fat dairy products and low in saturated fat and cholesterol. A healthy diet can lower high blood pressure by up to 11 mm Hg.  Reduce salt (sodium) in your diet Even a small reduction of sodium in the diet can improve heart health and reduce high blood pressure by about 5 to 6 mm Hg.  Limit alcohol One drink equals 12 ounces of beer, 5 ounces of wine, or 1.5 ounces of 80-proof liquor.  Limiting alcohol to less than one drink a day for women or two drinks a day for men can help lower blood pressure by about 4 mm Hg.  If you have any questions or concerns please use My Chart to send questions or call the office at [phone]

## 2024-04-21 NOTE — Progress Notes (Signed)
 Office Visit    Patient Name: Theodore Frank Date of Encounter: 04/21/2024  Primary Care Provider:  System, Provider Not In Primary Cardiologist:  None  Chief Complaint    Heart Failure Medication Titration - EF 25-30% (by echo 03/22/24)  Significant Past Medical History   CAD 5/25 CAC 79 (31st percentile); on atorvastatin   HTN On amlodipine  10, lisinopril  10, metoprolol  succ 25  HLD LDL 88 on atorvastatin  20  DM2 5/25 A1c 5.9, on metformin  ??       No Known Allergies  History of Present Illness    Theodore Frank is a 88 y.o. male patient of Dr Kate, in the office today for titration of heart failure medications.  He is currently on lisinopril , metoprolol  and empagliflozin , and notes the empagliflozin  cost over $300 for a 90 day supply. He also takes amlodipine  10 mg daily for blood pressure control.  Admits to not following a good routine with his meds.  States he takes the Jardiance  in the mornings, then takes most of his meds around 12-2 pm when he eats a meal.  Notes that the times vary daily.  He has not had any medications yet today except the Jardiance .   He also notes that he ran out of lisinopril  at least a month or so ago.  Unsure why it was never refilled.     Blood Pressure Goal:  130/80  GDMT: ACEI/ARB/ARNI [x] Yes [] No Lisinopril  10 mg daily (none in > 1 month)  Beta blocker [x] Yes [] No Metoprolol  succ 25 mg daily  MRA [] Yes [x] No   SGLT2 inhibitor [x] Yes [] No Empagliflozin  10 mg daily   Family Hx:   mother had hypertension  Social Hx:      Tobacco: former smoker, quit in 1967  Alcohol: no  Home BP readings:  has home BP machine, but not recently checked.    Accessory Clinical Findings    Lab Results  Component Value Date   CREATININE 1.43 (H) 04/12/2024   BUN 23 04/12/2024   NA 141 04/12/2024   K 4.0 04/12/2024   CL 106 04/12/2024   CO2 20 04/12/2024   Lab Results  Component Value Date   ALT 15 05/28/2020   AST 19 05/28/2020   ALKPHOS 56  05/28/2020   BILITOT 0.5 05/28/2020   Lab Results  Component Value Date   HGBA1C 5.9 (H) 02/18/2024    Home Medications/Allergies    Current Outpatient Medications  Medication Sig Dispense Refill   sacubitril -valsartan  (ENTRESTO ) 49-51 MG Take 1 tablet by mouth 2 (two) times daily. 60 tablet 1   amLODipine  (NORVASC ) 10 MG tablet Take 1 tablet (10 mg total) by mouth daily. 90 tablet 3   atorvastatin  (LIPITOR) 20 MG tablet Take 1 tablet (20 mg total) by mouth daily. 90 tablet 3   empagliflozin  (JARDIANCE ) 10 MG TABS tablet Take 1 tablet (10 mg total) by mouth daily before breakfast. 90 tablet 3   ferrous sulfate 325 (65 FE) MG EC tablet Take 650 mg by mouth daily at 6 (six) AM.     furosemide  (LASIX ) 20 MG tablet Take 1 tablet (20 mg total) by mouth as needed. as needed for edema (As needed for shortness of breath at night while laying flat or leg swelling). 90 tablet 3   metoprolol  succinate (TOPROL  XL) 25 MG 24 hr tablet Take 1 tablet (25 mg total) by mouth daily. 90 tablet 3   potassium chloride  SA (KLOR-CON  M) 20 MEQ tablet Take 1 tablet (20 mEq total) by  mouth as needed. (Take 1 tablet of potassium anytime that you take 20mg  of lasix ). 90 tablet 3   vitamin B-12 (CYANOCOBALAMIN) 1000 MCG tablet Take 1 tablet (1,000 mcg total) by mouth daily. 90 tablet 1   No current facility-administered medications for this visit.     No Known Allergies     Assessment & Plan      HFrEF (heart failure with reduced ejection fraction) (HCC) Assessment: BP in office today is 120/80 Patient with HFrEF   Tolerates current medications without any side effects  (metoprolol  succ 25 mg daily, empagliflozin  10 mg daily Denies SOB, palpitation, chest pain, headaches,or edema Concerns with regards to cost of empagliflozin ; mostly compliant, but takes meds at varying times of day;  no concern with ability to pick up at pharmacy Reiterated the importance of regular exercise and low salt  diet  Plan: GDMT ACEI/ARB/ARNI No lisinopril  in > 1 month - discontinue Start Entresto  49/51 mg bid  Beta blocker  Metoprolol  succ 25 mg daily  MRA Will start at next visit   SGLT2  Empagliflozin  10 mg daily   Labs orderd:  BMET at next visit Stop amlodipine  10 mg Follow up with PharmD  in 3 weeks    Allean Mink PharmD CPP [facility]  [address] [place], [place] [address] [phone]

## 2024-04-22 ENCOUNTER — Other Ambulatory Visit (HOSPITAL_COMMUNITY): Payer: Self-pay

## 2024-05-03 ENCOUNTER — Encounter (HOSPITAL_COMMUNITY): Payer: Self-pay

## 2024-05-03 ENCOUNTER — Telehealth (HOSPITAL_COMMUNITY): Payer: Self-pay | Admitting: *Deleted

## 2024-05-03 NOTE — Telephone Encounter (Signed)
 Reaching out to patient to offer assistance regarding upcoming cardiac imaging study; pt verbalizes understanding of appt date/time, parking situation and where to check in, pre-test NPO status; name and call back number provided for further questions should they arise  Larey Brick RN Navigator Cardiac Imaging [facility] [phone] office [phone] cell  Patient verbalized understanding of diet prep.

## 2024-05-05 ENCOUNTER — Encounter (HOSPITAL_COMMUNITY)
Admission: RE | Admit: 2024-05-05 | Discharge: 2024-05-05 | Disposition: A | Discharge status: Home / Self Care | Source: Ambulatory Visit | Attending: Cardiology | Admitting: Cardiology

## 2024-05-05 ENCOUNTER — Encounter (HOSPITAL_COMMUNITY): Payer: Self-pay

## 2024-05-05 DIAGNOSIS — B3322 Viral myocarditis: Secondary | ICD-10-CM | POA: Insufficient documentation

## 2024-05-05 DIAGNOSIS — D869 Sarcoidosis, unspecified: Secondary | ICD-10-CM | POA: Insufficient documentation

## 2024-05-09 ENCOUNTER — Ambulatory Visit: Attending: Internal Medicine | Admitting: Pharmacist Clinician (PhC)/ Clinical Pharmacy Specialist

## 2024-05-09 ENCOUNTER — Other Ambulatory Visit (HOSPITAL_COMMUNITY): Payer: Self-pay

## 2024-05-09 ENCOUNTER — Encounter: Payer: Self-pay | Admitting: Pharmacist Clinician (PhC)/ Clinical Pharmacy Specialist

## 2024-05-09 VITALS — BP 122/80 | HR 84

## 2024-05-09 DIAGNOSIS — I5020 Unspecified systolic (congestive) heart failure: Secondary | ICD-10-CM | POA: Diagnosis present

## 2024-05-09 MED ORDER — SPIRONOLACTONE 25 MG PO TABS
12.5000 mg | ORAL_TABLET | Freq: Every day | ORAL | 2 refills | Status: DC
Start: 2024-05-09 — End: 2024-07-11
  Filled 2024-05-09: qty 15, 30d supply, fill #0
  Filled 2024-06-03: qty 15, 30d supply, fill #1
  Filled 2024-07-03: qty 15, 30d supply, fill #2

## 2024-05-09 NOTE — Progress Notes (Signed)
 Office Visit    Patient Name: Theodore Frank Date of Encounter: 05/09/2024  Primary Care Provider:  [facility] Primary Cardiologist:  None  Chief Complaint    Heart Failure Medication Titration - EF 25-30% (by echo 03/22/24)  Significant Past Medical History   CAD 5/25 CAC 79 (31st percentile); on atorvastatin   HTN On amlodipine  10, lisinopril  10, metoprolol  succ 25  HLD LDL 88 on atorvastatin  20  DM2 5/25 A1c 5.9, on metformin  ??    No Known Allergies  History of Present Illness    Theodore Frank is a 88 y.o. male patient of Dr Kate, in the office today for titration of heart failure medications.  He is currently on lisinopril , metoprolol  and empagliflozin , and notes the empagliflozin  cost over $300 for a 90 day supply. He also takes amlodipine  10 mg daily for blood pressure control.  Admits to not following a good routine with his meds.  States he takes the Jardiance  in the mornings, then takes most of his meds around 12-2 pm when he eats a meal.  Notes that the times vary daily.  He has not had any medications yet today except the Jardiance .   He also notes that he ran out of lisinopril  at least a month or so ago.  Unsure why it was never refilled.   At his visit with me earlier this month I started him on Entresto  49/51 mg twice daily.   Today he is in the office for follow up.  He has been on Entresto  for 3 weeks now and notes that he is feeling better.  Still sleeps with 2 pillows, but is having less SOB.  Is able to get Entresto  and Jardiance  at no cost through a [employer].    Blood Pressure Goal:  130/80  GDMT: ACEI/ARB/ARNI [x] Yes [] No Entresto  49/51 bid  Beta blocker [x] Yes [] No Metoprolol  succ 25 mg daily  MRA [] Yes [x] No   SGLT2 inhibitor [x] Yes [] No Empagliflozin  10 mg daily   Family Hx:   mother had hypertension  Social Hx:      Tobacco: former smoker, quit in 1967  Alcohol: no  Home BP readings:  has home BP machine, but not recently  checked.  Looked good last check (a week or so ago),120 systolic  Accessory Clinical Findings    Lab Results  Component Value Date   CREATININE 1.43 (H) 04/12/2024   BUN 23 04/12/2024   NA 141 04/12/2024   K 4.0 04/12/2024   CL 106 04/12/2024   CO2 20 04/12/2024   Lab Results  Component Value Date   ALT 15 05/28/2020   AST 19 05/28/2020   ALKPHOS 56 05/28/2020   BILITOT 0.5 05/28/2020   Lab Results  Component Value Date   HGBA1C 5.9 (H) 02/18/2024    Home Medications/Allergies    Current Outpatient Medications  Medication Sig Dispense Refill   spironolactone  (ALDACTONE ) 25 MG tablet Take 1/2 tablets (12.5 mg total) by mouth daily. 15 tablet 2   atorvastatin  (LIPITOR) 20 MG tablet Take 1 tablet (20 mg total) by mouth daily. 90 tablet 3   empagliflozin  (JARDIANCE ) 10 MG TABS tablet Take 1 tablet (10 mg total) by mouth daily before breakfast. 90 tablet 3   ferrous sulfate 325 (65 FE) MG EC tablet Take 650 mg by mouth daily at 6 (six) AM.     furosemide  (LASIX ) 20 MG tablet Take 1 tablet (20 mg total) by mouth as needed. as needed for edema (As needed for shortness  of breath at night while laying flat or leg swelling). 90 tablet 3   metoprolol  succinate (TOPROL  XL) 25 MG 24 hr tablet Take 1 tablet (25 mg total) by mouth daily. 90 tablet 3   potassium chloride  SA (KLOR-CON  M) 20 MEQ tablet Take 1 tablet (20 mEq total) by mouth as needed. (Take 1 tablet of potassium anytime that you take 20mg  of lasix ). 90 tablet 3   sacubitril -valsartan  (ENTRESTO ) 49-51 MG Take 1 tablet by mouth 2 (two) times daily. 60 tablet 1   vitamin B-12 (CYANOCOBALAMIN) 1000 MCG tablet Take 1 tablet (1,000 mcg total) by mouth daily. 90 tablet 1   No current facility-administered medications for this visit.     No Known Allergies     Assessment & Plan      HFrEF (heart failure with reduced ejection fraction) (HCC) Assessment: BP in office today is 122/80 Patient with HFrEF  Tolerates current  medications without any side effects Feels better overall since starting Entresto  Denies SOB, palpitation, chest pain, headaches,or edema No concerns with regards to cost of medications, compliance, ability to pick up at pharmacy  (has [employer]) Reiterated the importance of regular exercise and low salt diet  Plan: GDMT ACEI/ARB/ARNI Entresto  49/51 mg bid continue  Beta blocker Metoprolol  succ 25 mg daily continue  MRA Spironolactone  12.5 mg daily Start today, BMET 10-14 days  SGLT2 Empagliflozin  10 mg continue   Labs orderd:  BMET 10-14 days Follow up with Dr. Kate in September   Allean Mink PharmD CPP [facility]  [address] [place], [place] [address] [phone]

## 2024-05-09 NOTE — Assessment & Plan Note (Signed)
 Assessment: BP in office today is 122/80 Patient with HFrEF  Tolerates current medications without any side effects Feels better overall since starting Entresto  Denies SOB, palpitation, chest pain, headaches,or edema No concerns with regards to cost of medications, compliance, ability to pick up at pharmacy  (has [employer]) Reiterated the importance of regular exercise and low salt diet  Plan: GDMT ACEI/ARB/ARNI Entresto  49/51 mg bid continue  Beta blocker Metoprolol  succ 25 mg daily continue  MRA Spironolactone  12.5 mg daily Start today, BMET 10-14 days  SGLT2 Empagliflozin  10 mg continue   Labs orderd:  BMET 10-14 days Follow up with Dr. Kate in September

## 2024-05-09 NOTE — Patient Instructions (Signed)
Follow up appointment: Sept 29 at 9:40 am with Dr. Kate  Go to the lab in 10-14 days to check kidney function  Take your BP meds as follows: start spironolactone  12.5 mg once daily  Continue with Entresto , metoprolol  and Jardiance   Check your blood pressure at home daily (if able) and keep record of the readings.  Your blood pressure goal is < 130/80  To check your pressure at home you will need to:  1. Sit up in a chair, with feet flat on the floor and back supported. Do not cross your ankles or legs. 2. Rest your left arm so that the cuff is about heart level. If the cuff goes on your upper arm,  then just relax the arm on the table, arm of the chair or your lap. If you have a wrist cuff, we  suggest relaxing your wrist against your chest (think of it as Pledging the Flag with the  wrong arm).  3. Place the cuff snugly around your arm, about 1 inch above the crook of your elbow. The  cords should be inside the groove of your elbow.  4. Sit quietly, with the cuff in place, for about 5 minutes. After that 5 minutes press the power  button to start a reading. 5. Do not talk or move while the reading is taking place.  6. Record your readings on a sheet of paper. Although most cuffs have a memory, it is often  easier to see a pattern developing when the numbers are all in front of you.  7. You can repeat the reading after 1-3 minutes if it is recommended  Make sure your bladder is empty and you have not had caffeine or tobacco within the last 30 min  Always bring your blood pressure log with you to your appointments. If you have not brought your monitor in to be double checked for accuracy, please bring it to your next appointment.  You can find a list of quality blood pressure cuffs at [email or web address]  Important lifestyle changes to control high blood pressure  Intervention  Effect on the BP  Lose extra pounds and watch your waistline Weight loss is one of the most effective  lifestyle changes for controlling blood pressure. If you're overweight or obese, losing even a small amount of weight can help reduce blood pressure. Blood pressure might go down by about 1 millimeter of mercury (mm Hg) with each kilogram (about 2.2 pounds) of weight lost.  Exercise regularly As a general goal, aim for at least 30 minutes of moderate physical activity every day. Regular physical activity can lower high blood pressure by about 5 to 8 mm Hg.  Eat a healthy diet Eating a diet rich in whole grains, fruits, vegetables, and low-fat dairy products and low in saturated fat and cholesterol. A healthy diet can lower high blood pressure by up to 11 mm Hg.  Reduce salt (sodium) in your diet Even a small reduction of sodium in the diet can improve heart health and reduce high blood pressure by about 5 to 6 mm Hg.  Limit alcohol One drink equals 12 ounces of beer, 5 ounces of wine, or 1.5 ounces of 80-proof liquor.  Limiting alcohol to less than one drink a day for women or two drinks a day for men can help lower blood pressure by about 4 mm Hg.   If you have any questions or concerns please use My Chart to send questions or call the office at (  [phone]      

## 2024-05-20 LAB — BASIC METABOLIC PANEL WITH GFR
BUN/Creatinine Ratio: 19 (ref 10–24)
BUN: 25 mg/dL (ref 8–27)
CO2: 19 mmol/L — ABNORMAL LOW (ref 20–29)
Calcium: 10.6 mg/dL — ABNORMAL HIGH (ref 8.6–10.2)
Chloride: 105 mmol/L (ref 96–106)
Creatinine, Ser: 1.34 mg/dL — AB (ref 0.76–1.27)
Glucose: 105 mg/dL — ABNORMAL HIGH (ref 70–99)
Potassium: 4.1 mmol/L (ref 3.5–5.2)
Sodium: 141 mmol/L (ref 134–144)
eGFR: 52 mL/min/1.73 — AB (ref >59)

## 2024-05-23 ENCOUNTER — Ambulatory Visit: Payer: Self-pay | Admitting: Pharmacist

## 2024-05-31 ENCOUNTER — Other Ambulatory Visit (HOSPITAL_COMMUNITY): Payer: Self-pay

## 2024-06-07 ENCOUNTER — Ambulatory Visit: Admitting: Cardiology

## 2024-06-14 ENCOUNTER — Telehealth (HOSPITAL_COMMUNITY): Payer: Self-pay | Admitting: Emergency Medicine

## 2024-06-14 NOTE — Telephone Encounter (Signed)
 Reaching out to patient to offer assistance regarding upcoming cardiac imaging study; pt verbalizes understanding of appt date/time, parking situation and where to check in, pre-test NPO status and medications ordered, and verified current allergies; name and call back number provided for further questions should they arise Rockwell Alexandria RN Navigator Cardiac Imaging [facility] [phone] office [phone] cell

## 2024-06-16 ENCOUNTER — Encounter (HOSPITAL_COMMUNITY): Admission: RE | Admit: 2024-06-16 | Source: Ambulatory Visit

## 2024-06-21 ENCOUNTER — Telehealth (HOSPITAL_COMMUNITY): Payer: Self-pay | Admitting: *Deleted

## 2024-06-21 NOTE — Telephone Encounter (Signed)
 Reaching out to patient to offer assistance regarding upcoming cardiac imaging study; pt verbalizes understanding of appt date/time, parking situation and where to check in, pre-test NPO status; name and call back number provided for further questions should they arise  Theodore Brick RN Navigator Cardiac Imaging [facility] [phone] office [phone] cell  Patient verbalized understanding of diet prep.

## 2024-06-23 ENCOUNTER — Other Ambulatory Visit: Payer: Self-pay | Admitting: Family Medicine

## 2024-06-23 ENCOUNTER — Ambulatory Visit (HOSPITAL_COMMUNITY)
Admission: RE | Admit: 2024-06-23 | Discharge: 2024-06-23 | Disposition: A | Discharge status: Home / Self Care | Source: Ambulatory Visit | Attending: Cardiology | Admitting: Cardiology

## 2024-06-23 ENCOUNTER — Ambulatory Visit
Admission: RE | Admit: 2024-06-23 | Discharge: 2024-06-23 | Disposition: A | Discharge status: Home / Self Care | Source: Ambulatory Visit | Attending: Family Medicine | Admitting: Family Medicine

## 2024-06-23 DIAGNOSIS — B3322 Viral myocarditis: Secondary | ICD-10-CM | POA: Diagnosis present

## 2024-06-23 DIAGNOSIS — I700 Atherosclerosis of aorta: Secondary | ICD-10-CM | POA: Insufficient documentation

## 2024-06-23 DIAGNOSIS — D869 Sarcoidosis, unspecified: Secondary | ICD-10-CM | POA: Diagnosis not present

## 2024-06-23 DIAGNOSIS — I7121 Aneurysm of the ascending aorta, without rupture: Secondary | ICD-10-CM

## 2024-06-23 LAB — NM PET CT MYOCARDIAL SARCOIDOSIS
LV dias vol: 202 mL (ref 62–150)
Nuc Stress EF: 19 %
Rest Nuclear Isotope Dose: 19.4 mCi

## 2024-06-23 MED ORDER — FLUDEOXYGLUCOSE F - 18 (FDG) INJECTION
8.9700 | Freq: Once | INTRAVENOUS | Status: AC | PRN
  Administered 2024-06-23: 8.97 via INTRAVENOUS

## 2024-06-23 MED ORDER — RUBIDIUM RB82 GENERATOR (RUBYFILL)
19.4400 | PACK | Freq: Once | INTRAVENOUS | Status: AC
  Administered 2024-06-23: 19.44 via INTRAVENOUS

## 2024-06-23 MED ORDER — IOPAMIDOL (ISOVUE-370) INJECTION 76%
75.0000 mL | Freq: Once | INTRAVENOUS | Status: AC | PRN
  Administered 2024-06-23: 75 mL via INTRAVENOUS

## 2024-06-24 ENCOUNTER — Ambulatory Visit: Payer: Self-pay | Admitting: Cardiology

## 2024-07-03 ENCOUNTER — Other Ambulatory Visit (HOSPITAL_COMMUNITY): Payer: Self-pay

## 2024-07-03 ENCOUNTER — Other Ambulatory Visit: Payer: Self-pay | Admitting: Cardiology

## 2024-07-04 ENCOUNTER — Other Ambulatory Visit: Payer: Self-pay

## 2024-07-10 NOTE — Progress Notes (Unsigned)
 Cardiology Office Note:    Date:  07/12/2024  ID:  Theodore Frank, DOB Mar 26, 1936, MRN [id number]  PCP:  [facility]  Cardiologist:  None  Electrophysiologist:  None   Referring MD: No ref. provider found   No chief complaint on file.   History of Present Illness:    Theodore Frank is a 88 y.o. male with a hx of heart failure with reduced ejection fraction, colon cancer, T2DM, HTN, HLD, CKD III, COPD who presents for follow-up.[name]  He was seen in ED 01/2024 with chest pain and dyspnea.  Troponin 24>31.  BNP 417.  CTA chest showed no PE, ascending aorta aneurysm measuring 46mm, LAD calcifications, 2 small pulmonary nodules.  Echocardiogram 08/2021 showed EF 35-40%, normal RV function, mild to moderate mitral regurgitation.  Does not appear that he had any follow-up for this.  Seen for initial visit 02/17/2024.  Coronary CTA on 03/04/2024 showed mild nonobstructive CAD, calcium  score 79 (31st percentile).  Echocardiogram 03/22/2024 showed EF 25 to 30%, G2 DD, normal RV function, moderate mitral regurgitation, dilated ascending aorta measuring 43 mm.  Cardiac MRI on 04/11/2024 showed LVEF 20%, RVEF 38%, basal lateral subepicardial LGE and RV insertion site LGE; differential includes prior myocarditis or sarcoidosis (suspect prior myocarditis).  Underwent FDG PET on 06/23/2024, which showed no evidence of sarcoidosis.  Since last clinic visit, he reports he is doing okay. Denies any chest pain, dyspnea, lightheadedness, syncope, or palpitations.  Reports has been feeling weak.  Reports occasional lower extremity edema.  He is not sure what medications he been taking, thinks has not been taking Entresto  or Jardiance .  Wt Readings from Last 3 Encounters:  07/11/24 161 lb 9.6 oz (73.3 kg)  04/06/24 163 lb 6.4 oz (74.1 kg)  02/17/24 165 lb (74.8 kg)     Past Medical History:  Diagnosis Date   Acute encephalopathy 04/23/2016   Anemia    Arthritis    Cancer (HCC) 10/14/1999   colon cancer  (surgery; no chemo; no radiation)   Diabetes mellitus    Hyperlipidemia    Hypertension    Prostate cancer (HCC) 10/13/1192   s/p prostatectomy   Right inguinal hernia 02/25/2012   Shortness of breath dyspnea    with exertion    Past Surgical History:  Procedure Laterality Date   COLON SURGERY  2001   colon resection for colon cancer   HERNIA REPAIR Right    INGUINAL HERNIA REPAIR  03/05/2012   Procedure: HERNIA REPAIR INGUINAL ADULT;  Surgeon: Dann FORBES Hummer, MD;  Location: [facility];  Service: General;  Laterality: Right;  Repair right inguinal hernia with mesh   LUMBAR LAMINECTOMY/DECOMPRESSION MICRODISCECTOMY Left 11/06/2014   Procedure: LEFT LUMBAR THREE-FOUR LAMINECTOMY/DECOMPRESSION MICRODISCECTOMY 1 LEVEL;  Surgeon: Arley SHAUNNA Helling, MD;  Location: [facility];  Service: Neurosurgery;  Laterality: Left;   PROSTATE SURGERY     prostatectomy for prostate cancer    Current Medications: Current Meds  Medication Sig   ferrous sulfate 325 (65 FE) MG EC tablet Take 650 mg by mouth daily at 6 (six) AM.   [DISCONTINUED] atorvastatin  (LIPITOR) 20 MG tablet Take 1 tablet (20 mg total) by mouth daily.   [DISCONTINUED] furosemide  (LASIX ) 20 MG tablet Take 1 tablet (20 mg total) by mouth as needed. as needed for edema (As needed for shortness of breath at night while laying flat or leg swelling).   [DISCONTINUED] JARDIANCE  10 MG TABS tablet TAKE 1 TABLET BY MOUTH DAILY BEFORE BREAKFAST.   [DISCONTINUED]  metoprolol  succinate (TOPROL  XL) 25 MG 24 hr tablet Take 1 tablet (25 mg total) by mouth daily.   [DISCONTINUED] potassium chloride  SA (KLOR-CON  M) 20 MEQ tablet Take 1 tablet (20 mEq total) by mouth as needed. (Take 1 tablet of potassium anytime that you take 20mg  of lasix ).   [DISCONTINUED] sacubitril -valsartan  (ENTRESTO ) 49-51 MG Take 1 tablet by mouth 2 (two) times daily.   [DISCONTINUED] spironolactone  (ALDACTONE ) 25 MG tablet Take 1/2 tablets (12.5 mg total) by mouth daily.      Allergies:   Patient has no known allergies.   Social History   Socioeconomic History   Marital status: Divorced    Spouse name: Not on file   Number of children: 4   Years of education: 12   Highest education level: Not on file  Occupational History   Occupation: Research scientist (physical sciences)  Tobacco Use   Smoking status: Former    Current packs/day: 0.00    Types: Cigarettes    Quit date: 10/13/1965    Years since quitting: 58.7   Smokeless tobacco: Never  Vaping Use   Vaping status: Never Used  Substance and Sexual Activity   Alcohol use: No   Drug use: No   Sexual activity: Yes    Birth control/protection: None  Other Topics Concern   Not on file  Social History Narrative   Marital status: divorced; single in 2018; not dating      Children: 4 total children; 1 son died of AIDS; 3 living children       Live:Lives alone      Employment: truck Hospital doctor; Loss adjuster, chartered; Psychologist, clinical; local      Tobacco: previous smoker; quit in 1967      Alcohol: none      Exercise: none; scared to exercise      ADLs: independent with ADLs; drives      Advanced Directives:  None; desires FULL CODE         Right-handed   Only occasional coffee or hot tea   Social Drivers of Corporate investment banker Strain: Low Risk  (11/17/2017)   Overall Financial Resource Strain (CARDIA)    Difficulty of Paying Living Expenses: Not hard at all  Food Insecurity: No Food Insecurity (11/17/2017)   Hunger Vital Sign    Worried About Running Out of Food in the Last Year: Never true    Ran Out of Food in the Last Year: Never true  Transportation Needs: No Transportation Needs (11/17/2017)   PRAPARE - Administrator, Civil Service (Medical): No    Lack of Transportation (Non-Medical): No  Physical Activity: Inactive (11/17/2017)   Exercise Vital Sign    Days of Exercise per Week: 0 days    Minutes of Exercise per Session: 0 min  Stress: No Stress Concern Present (11/17/2017)   [employer] of  Occupational Health - Occupational Stress Questionnaire    Feeling of Stress : Not at all  Social Connections: Unknown (02/21/2022)   Received from [facility]   Social Network    Social Network: Not on file     Family History: The patient's family history includes Diabetes in an other family member; Hypertension in his mother; Thyroid  disease in his sister.  ROS:   Please see the history of present illness.     All other systems reviewed and are negative.  EKGs/Labs/Other Studies Reviewed:    The following studies were reviewed today:   EKG:   02/17/2024: Normal sinus  rhythm, LVH with repolarization abnormalities, rate 88 07/12/2024: Sinus rhythm, PVCs, LVH with repolarization abnormalities, rate 75   Recent Labs: 02/09/2024: B Natriuretic Peptide 417.2 04/12/2024: Hemoglobin 14.3; Platelets 179 07/11/2024: BUN 21; Creatinine, Ser 1.38; Magnesium 2.3; Potassium 4.0; Sodium 142  Recent Lipid Panel    Component Value Date/Time   CHOL 164 02/18/2024 0810   TRIG 107 02/18/2024 0810   HDL 57 02/18/2024 0810   CHOLHDL 2.9 02/18/2024 0810   CHOLHDL 3.5 02/29/2016 0959   VLDL 18 02/29/2016 0959   LDLCALC 88 02/18/2024 0810    Physical Exam:    VS:  BP 138/70 (BP Location: Left Arm, Patient Position: Sitting)   Pulse 84   Ht 6' 1 (1.854 m)   Wt 161 lb 9.6 oz (73.3 kg)   SpO2 98%   BMI 21.32 kg/m     Wt Readings from Last 3 Encounters:  07/11/24 161 lb 9.6 oz (73.3 kg)  04/06/24 163 lb 6.4 oz (74.1 kg)  02/17/24 165 lb (74.8 kg)     GEN:  Well nourished, well developed in no acute distress HEENT: Normal NECK: No JVD; No carotid bruits LYMPHATICS: No lymphadenopathy CARDIAC: RRR, no murmurs, rubs, gallops RESPIRATORY:  Clear to auscultation without rales, wheezing or rhonchi  ABDOMEN: Soft, non-tender, non-distended MUSCULOSKELETAL:  No edema; No deformity  SKIN: Warm and dry NEUROLOGIC:  Alert and oriented x 3 PSYCHIATRIC:  Normal affect   ASSESSMENT:    1.  Chronic combined systolic and diastolic heart failure (HCC)   2. Coronary artery disease involving native coronary artery of native heart without angina pectoris   3. Aneurysm of ascending aorta without rupture   4. Essential hypertension   5. PVC (premature ventricular contraction)      PLAN:    Chronic combined heart failure: Echocardiogram 08/2021 showed EF 35-40%, normal RV function, mild to moderate mitral regurgitation.  Does not appear he has not had heart failure follow-up.  Coronary CTA on 03/04/2024 showed mild nonobstructive CAD, calcium  score 79 (31st percentile).  Echocardiogram 03/22/2024 showed EF 25 to 30%, G2 DD, normal RV function, moderate mitral regurgitation, dilated ascending aorta measuring 43 mm.  Cardiac MRI on 04/11/2024 showed LVEF 20%, RVEF 38%, basal lateral subepicardial LGE and RV insertion site LGE; differential includes prior myocarditis or sarcoidosis (suspect prior myocarditis).  Underwent FDG PET on 06/23/2024, which showed no evidence of sarcoidosis. -Appears euvolemic.  On as needed lasix  20 mg.  Recommend monitoring daily weights and can take Lasix  20 mg as needed if gains more than 3 pounds in 1 day or 5 pounds in 1 week.  Reports has not needed to take Lasix  -Heart failure regimen is supposed to be Entresto  49 to 51 mg twice daily, Toprol -XL 25 mg daily, Jardiance  10 mg daily, and spironolactone  12.5 mg daily.  However he is not sure what medications he has been taking.  He thinks has been off Entresto  and Jardiance .  Discussed with Josette Mink in pharmacy who saw him today and will work with him on his medications -Check BMET, magnesium - Will update echocardiogram once he is regularly taking GDMT  CAD: Coronary CTA on 03/04/2024 showed mild nonobstructive CAD, calcium  score 79 (31st percentile).  -Continue atorvastatin   PVCs: Noted in clinic today.  Will check Zio patch x 3 days to quantify PVC burden  Ascending aortic aneurysm: Measured 46 mm on CTA  01/2024.  Will follow  Hypertension: on Entresto , Toprol -XL, spironolactone  as above  Hyperlipidemia: on atorvastatin  20 mg daily.  LDL 88 on 02/18/24  T2DM: on metformin .  A1c 5.9% on 02/18/24   RTC in 3 months   Medication Adjustments/Labs and Tests Ordered: Current medicines are reviewed at length with the patient today.  Concerns regarding medicines are outlined above.  Orders Placed This Encounter  Procedures   Basic Metabolic Panel (BMET)   Magnesium   LONG TERM MONITOR (3-14 DAYS)   EKG 12-Lead   Meds ordered this encounter  Medications   DISCONTD: sacubitril -valsartan  (ENTRESTO ) 49-51 MG    Sig: Take 1 tablet by mouth 2 (two) times daily.    Dispense:  60 tablet    Refill:  1    [facility]  Card ID  [id number],  BIN  [id number],  PCN  [id number],  GRP  [id number].  Please send via delivery service to patient.  (Also, please transfer Jardiance  prescription from [facility] for future fills, will also be under [facility] - Cardiomyopathy)    Patient Instructions  Medication Instructions:  Your physician recommends that you continue on your current medications as directed. Please refer to the Current Medication list given to you today.  *If you need a refill on your cardiac medications before your next appointment, please call your pharmacy*  Lab Work: BMET, MG TODAY If you have labs (blood work) drawn today and your tests are completely normal, you will receive your results only by: MyChart Message (if you have MyChart) OR A paper copy in the mail If you have any lab test that is abnormal or we need to change your treatment, we will call you to review the results.  Testing/Procedures:  ZIO XT- Long Term Monitor Instructions   Your physician has requested you wear your ZIO patch monitor___3____days.   This is a single patch monitor.  [employer] supplies one patch monitor per enrollment.  Additional stickers are not available.   Please do not apply patch if you will be having  a Nuclear Stress Test, Echocardiogram, Cardiac CT, MRI, or Chest Xray during the time frame you would be wearing the monitor. The patch cannot be worn during these tests.  You cannot remove and re-apply the ZIO XT patch monitor.   Your ZIO patch monitor will be sent USPS Priority mail from [facility] directly to your home address. The monitor may also be mailed to a PO BOX if home delivery is not available.   It may take 3-5 days to receive your monitor after you have been enrolled.   Once you have received you monitor, please review enclosed instructions.  Your monitor has already been registered assigning a specific monitor serial # to you.   Applying the monitor   Shave hair from upper left chest.   Hold abrader disc by orange tab.  Rub abrader in 40 strokes over left upper chest as indicated in your monitor instructions.   Clean area with 4 enclosed alcohol pads .  Use all pads to assure are is cleaned thoroughly.  Let dry.   Apply patch as indicated in monitor instructions.  Patch will be place under collarbone on left side of chest with arrow pointing upward.   Rub patch adhesive wings for 2 minutes.Remove white label marked 1.  Remove white label marked 2.  Rub patch adhesive wings for 2 additional minutes.   While looking in a mirror, press and release button in center of patch.  A small green light will flash 3-4 times .  This will be your only indicator the monitor has been turned on.  Do not shower for the first 24 hours.  You may shower after the first 24 hours.   Press button if you feel a symptom. You will hear a small click.  Record Date, Time and Symptom in the Patient Log Book.   When you are ready to remove patch, follow instructions on last 2 pages of Patient Log Book.  Stick patch monitor onto last page of Patient Log Book.   Place Patient Log Book in [place] box.  Use locking tab on box and tape box closed securely.  The Orange and [employer] has [employer] on it.  Please place in mailbox as soon as possible.  Your physician should have your test results approximately 7 days after the monitor has been mailed back to [facility].   Call [facility] Customer Care at [phone] if you have questions regarding your ZIO XT patch monitor.  Call them immediately if you see an orange light blinking on your monitor.   If your monitor falls off in less than 4 days contact our Monitor department at [phone].  If your monitor becomes loose or falls off after 4 days call [employer] at [phone] for suggestions on securing your monitor.    Follow-Up: At [facility], you and your health needs are our priority.  As part of our continuing mission to provide you with exceptional heart care, our providers are all part of one team.  This team includes your primary Cardiologist (physician) and Advanced Practice Providers or APPs (Physician Assistants and Nurse Practitioners) who all work together to provide you with the care you need, when you need it.  Your next appointment:   3 months  Provider:   Dr. Kate  We recommend signing up for the patient portal called MyChart.  Sign up information is provided on this After Visit Summary.  MyChart is used to connect with patients for Virtual Visits (Telemedicine).  Patients are able to view lab/test results, encounter notes, upcoming appointments, etc.  Non-urgent messages can be sent to your provider as well.   To learn more about what you can do with MyChart, go to [email or web address].   Other Instructions  none          Signed, Lonni LITTIE Kate, MD  07/12/2024 12:07 PM    [facility]

## 2024-07-11 ENCOUNTER — Other Ambulatory Visit: Payer: Self-pay | Admitting: Pharmacist Clinician (PhC)/ Clinical Pharmacy Specialist

## 2024-07-11 ENCOUNTER — Other Ambulatory Visit (HOSPITAL_COMMUNITY): Payer: Self-pay

## 2024-07-11 ENCOUNTER — Ambulatory Visit: Attending: Cardiology | Admitting: Cardiology

## 2024-07-11 ENCOUNTER — Other Ambulatory Visit: Payer: Self-pay

## 2024-07-11 ENCOUNTER — Ambulatory Visit

## 2024-07-11 ENCOUNTER — Encounter: Payer: Self-pay | Admitting: Cardiology

## 2024-07-11 VITALS — BP 138/70 | HR 84 | Ht 73.0 in | Wt 161.6 lb

## 2024-07-11 DIAGNOSIS — I10 Essential (primary) hypertension: Secondary | ICD-10-CM | POA: Diagnosis not present

## 2024-07-11 DIAGNOSIS — I493 Ventricular premature depolarization: Secondary | ICD-10-CM | POA: Diagnosis present

## 2024-07-11 DIAGNOSIS — I5042 Chronic combined systolic (congestive) and diastolic (congestive) heart failure: Secondary | ICD-10-CM | POA: Insufficient documentation

## 2024-07-11 DIAGNOSIS — E7800 Pure hypercholesterolemia, unspecified: Secondary | ICD-10-CM

## 2024-07-11 DIAGNOSIS — I2510 Atherosclerotic heart disease of native coronary artery without angina pectoris: Secondary | ICD-10-CM | POA: Insufficient documentation

## 2024-07-11 DIAGNOSIS — R079 Chest pain, unspecified: Secondary | ICD-10-CM

## 2024-07-11 DIAGNOSIS — I7121 Aneurysm of the ascending aorta, without rupture: Secondary | ICD-10-CM | POA: Insufficient documentation

## 2024-07-11 MED ORDER — ATORVASTATIN CALCIUM 20 MG PO TABS
20.0000 mg | ORAL_TABLET | Freq: Every day | ORAL | 3 refills | Status: AC
  Filled 2024-07-11: qty 90, 90d supply, fill #0
  Filled 2024-07-11 – 2024-07-12 (×2): qty 30, 30d supply, fill #0
  Filled 2024-08-04 – 2024-08-09 (×3): qty 30, 30d supply, fill #1
  Filled 2024-09-01 – 2024-09-05 (×5): qty 30, 30d supply, fill #2
  Filled 2024-09-21 – 2024-10-07 (×3): qty 30, 30d supply, fill #3
  Filled 2024-10-30: qty 30, 30d supply, fill #4

## 2024-07-11 MED ORDER — SACUBITRIL-VALSARTAN 49-51 MG PO TABS: 1.0000 | ORAL_TABLET | Freq: Two times a day (BID) | ORAL | 3 refills | Status: DC

## 2024-07-11 MED ORDER — METOPROLOL SUCCINATE ER 25 MG PO TB24
25.0000 mg | ORAL_TABLET | Freq: Every day | ORAL | 3 refills | Status: DC
  Filled 2024-07-11: qty 30, 30d supply, fill #0
  Filled 2024-07-11: qty 90, 90d supply, fill #0
  Filled 2024-07-12: qty 30, 30d supply, fill #0
  Filled 2024-08-04: qty 30, 30d supply, fill #1

## 2024-07-11 MED ORDER — SACUBITRIL-VALSARTAN 49-51 MG PO TABS
1.0000 | ORAL_TABLET | Freq: Two times a day (BID) | ORAL | 3 refills | Status: AC
  Filled 2024-07-11 – 2024-07-12 (×2): qty 60, 30d supply, fill #0
  Filled 2024-08-04 – 2024-08-09 (×3): qty 60, 30d supply, fill #1
  Filled 2024-09-01 – 2024-10-07 (×5): qty 60, 30d supply, fill #2
  Filled 2024-10-30: qty 60, 30d supply, fill #3
  Filled 2024-11-14: qty 42, 21d supply, fill #3
  Filled 2024-11-14: qty 60, 30d supply, fill #3
  Filled 2024-11-14: qty 42, 21d supply, fill #3

## 2024-07-11 MED ORDER — FUROSEMIDE 20 MG PO TABS
20.0000 mg | ORAL_TABLET | ORAL | 1 refills | Status: AC | PRN
  Filled 2024-07-11: qty 90, 90d supply, fill #0

## 2024-07-11 MED ORDER — POTASSIUM CHLORIDE CRYS ER 20 MEQ PO TBCR
20.0000 meq | EXTENDED_RELEASE_TABLET | ORAL | 1 refills | Status: AC | PRN
  Filled 2024-07-11: qty 90, 90d supply, fill #0
  Filled 2024-08-04 – 2024-08-08 (×2): qty 30, 30d supply, fill #0
  Filled 2024-09-05 – 2024-10-07 (×3): qty 30, 30d supply, fill #1

## 2024-07-11 MED ORDER — SPIRONOLACTONE 25 MG PO TABS
12.5000 mg | ORAL_TABLET | Freq: Every day | ORAL | 3 refills | Status: DC
  Filled 2024-07-11: qty 45, 90d supply, fill #0
  Filled 2024-08-04 – 2024-08-08 (×2): qty 15, 30d supply, fill #0
  Filled 2024-09-05 (×2): qty 15, 30d supply, fill #1
  Filled 2024-09-21 – 2024-09-30 (×2): qty 15, 30d supply, fill #2

## 2024-07-11 MED ORDER — EMPAGLIFLOZIN 10 MG PO TABS
10.0000 mg | ORAL_TABLET | Freq: Every day | ORAL | 3 refills | Status: AC
  Filled 2024-07-11 – 2024-09-01 (×2): qty 90, 90d supply, fill #0
  Filled 2024-09-02 – 2024-09-05 (×2): qty 30, 30d supply, fill #0
  Filled 2024-09-05 – 2024-09-28 (×2): qty 90, 90d supply, fill #0
  Filled 2024-10-07: qty 30, 30d supply, fill #0
  Filled 2024-10-30: qty 30, 30d supply, fill #1

## 2024-07-11 MED ORDER — SACUBITRIL-VALSARTAN 49-51 MG PO TABS: 1.0000 | ORAL_TABLET | Freq: Two times a day (BID) | ORAL | 1 refills | Status: DC

## 2024-07-11 NOTE — Progress Notes (Unsigned)
Applied a 3 day Zio XT monitor to patient in the office 

## 2024-07-11 NOTE — Patient Instructions (Addendum)
 Medication Instructions:  Your physician recommends that you continue on your current medications as directed. Please refer to the Current Medication list given to you today.  *If you need a refill on your cardiac medications before your next appointment, please call your pharmacy*  Lab Work: BMET, MG TODAY If you have labs (blood work) drawn today and your tests are completely normal, you will receive your results only by: MyChart Message (if you have MyChart) OR A paper copy in the mail If you have any lab test that is abnormal or we need to change your treatment, we will call you to review the results.  Testing/Procedures:  ZIO XT- Long Term Monitor Instructions   Your physician has requested you wear your ZIO patch monitor___3____days.   This is a single patch monitor.  [employer] supplies one patch monitor per enrollment.  Additional stickers are not available.   Please do not apply patch if you will be having a Nuclear Stress Test, Echocardiogram, Cardiac CT, MRI, or Chest Xray during the time frame you would be wearing the monitor. The patch cannot be worn during these tests.  You cannot remove and re-apply the ZIO XT patch monitor.   Your ZIO patch monitor will be sent USPS Priority mail from [facility] directly to your home address. The monitor may also be mailed to a PO BOX if home delivery is not available.   It may take 3-5 days to receive your monitor after you have been enrolled.   Once you have received you monitor, please review enclosed instructions.  Your monitor has already been registered assigning a specific monitor serial # to you.   Applying the monitor   Shave hair from upper left chest.   Hold abrader disc by orange tab.  Rub abrader in 40 strokes over left upper chest as indicated in your monitor instructions.   Clean area with 4 enclosed alcohol pads .  Use all pads to assure are is cleaned thoroughly.  Let dry.   Apply patch as indicated in monitor  instructions.  Patch will be place under collarbone on left side of chest with arrow pointing upward.   Rub patch adhesive wings for 2 minutes.Remove white label marked 1.  Remove white label marked 2.  Rub patch adhesive wings for 2 additional minutes.   While looking in a mirror, press and release button in center of patch.  A small green light will flash 3-4 times .  This will be your only indicator the monitor has been turned on.     Do not shower for the first 24 hours.  You may shower after the first 24 hours.   Press button if you feel a symptom. You will hear a small click.  Record Date, Time and Symptom in the Patient Log Book.   When you are ready to remove patch, follow instructions on last 2 pages of Patient Log Book.  Stick patch monitor onto last page of Patient Log Book.   Place Patient Log Book in [place] box.  Use locking tab on box and tape box closed securely.  The Orange and [employer] has [employer] on it.  Please place in mailbox as soon as possible.  Your physician should have your test results approximately 7 days after the monitor has been mailed back to [facility].   Call [facility] Customer Care at [phone] if you have questions regarding your ZIO XT patch monitor.  Call them immediately if you see an orange light  blinking on your monitor.   If your monitor falls off in less than 4 days contact our Monitor department at [phone].  If your monitor becomes loose or falls off after 4 days call [employer] at [phone] for suggestions on securing your monitor.    Follow-Up: At [facility], you and your health needs are our priority.  As part of our continuing mission to provide you with exceptional heart care, our providers are all part of one team.  This team includes your primary Cardiologist (physician) and Advanced Practice Providers or APPs (Physician Assistants and Nurse Practitioners) who all work together to provide you with the care  you need, when you need it.  Your next appointment:   3 months  Provider:   Dr. Kate  We recommend signing up for the patient portal called MyChart.  Sign up information is provided on this After Visit Summary.  MyChart is used to connect with patients for Virtual Visits (Telemedicine).  Patients are able to view lab/test results, encounter notes, upcoming appointments, etc.  Non-urgent messages can be sent to your provider as well.   To learn more about what you can do with MyChart, go to [email or web address].   Other Instructions  none

## 2024-07-12 ENCOUNTER — Ambulatory Visit: Payer: Self-pay | Admitting: Cardiology

## 2024-07-12 ENCOUNTER — Other Ambulatory Visit: Payer: Self-pay

## 2024-07-12 DIAGNOSIS — I10 Essential (primary) hypertension: Secondary | ICD-10-CM

## 2024-07-12 LAB — BASIC METABOLIC PANEL WITH GFR
BUN/Creatinine Ratio: 15 (ref 10–24)
BUN: 21 mg/dL (ref 8–27)
CO2: 21 mmol/L (ref 20–29)
Calcium: 10.9 mg/dL — ABNORMAL HIGH (ref 8.6–10.2)
Chloride: 106 mmol/L (ref 96–106)
Creatinine, Ser: 1.38 mg/dL — ABNORMAL HIGH (ref 0.76–1.27)
Glucose: 113 mg/dL — ABNORMAL HIGH (ref 70–99)
Potassium: 4 mmol/L (ref 3.5–5.2)
Sodium: 142 mmol/L (ref 134–144)
eGFR: 50 mL/min/1.73 — ABNORMAL LOW (ref >59)

## 2024-07-12 LAB — MAGNESIUM: Magnesium: 2.3 mg/dL (ref 1.6–2.3)

## 2024-07-13 ENCOUNTER — Other Ambulatory Visit (HOSPITAL_BASED_OUTPATIENT_CLINIC_OR_DEPARTMENT_OTHER): Payer: Self-pay

## 2024-07-13 ENCOUNTER — Other Ambulatory Visit: Payer: Self-pay

## 2024-07-14 ENCOUNTER — Other Ambulatory Visit: Payer: Self-pay

## 2024-07-15 ENCOUNTER — Other Ambulatory Visit: Payer: Self-pay

## 2024-08-01 DIAGNOSIS — I493 Ventricular premature depolarization: Secondary | ICD-10-CM

## 2024-08-03 ENCOUNTER — Other Ambulatory Visit: Payer: Self-pay

## 2024-08-04 ENCOUNTER — Other Ambulatory Visit: Payer: Self-pay

## 2024-08-04 ENCOUNTER — Other Ambulatory Visit (HOSPITAL_COMMUNITY): Payer: Self-pay

## 2024-08-04 MED ORDER — METOPROLOL SUCCINATE ER 50 MG PO TB24
50.0000 mg | ORAL_TABLET | Freq: Every day | ORAL | 3 refills | Status: AC
  Filled 2024-08-08: qty 90, 90d supply, fill #0
  Filled 2024-10-07: qty 30, 30d supply, fill #0

## 2024-08-05 ENCOUNTER — Other Ambulatory Visit: Payer: Self-pay

## 2024-08-05 ENCOUNTER — Other Ambulatory Visit: Payer: Self-pay | Admitting: Cardiology

## 2024-08-05 DIAGNOSIS — R079 Chest pain, unspecified: Secondary | ICD-10-CM

## 2024-08-08 ENCOUNTER — Other Ambulatory Visit: Payer: Self-pay

## 2024-08-08 ENCOUNTER — Other Ambulatory Visit (HOSPITAL_COMMUNITY): Payer: Self-pay

## 2024-08-09 ENCOUNTER — Other Ambulatory Visit: Payer: Self-pay

## 2024-08-09 ENCOUNTER — Other Ambulatory Visit (HOSPITAL_COMMUNITY): Payer: Self-pay

## 2024-08-09 MED ORDER — METOPROLOL SUCCINATE ER 50 MG PO TB24
50.0000 mg | ORAL_TABLET | Freq: Every day | ORAL | 3 refills | Status: AC
  Filled 2024-09-01 – 2024-09-05 (×3): qty 30, 30d supply, fill #0
  Filled 2024-09-28: qty 90, 90d supply, fill #0
  Filled 2024-10-30: qty 30, 30d supply, fill #0

## 2024-08-10 ENCOUNTER — Other Ambulatory Visit: Payer: Self-pay

## 2024-08-27 ENCOUNTER — Other Ambulatory Visit (HOSPITAL_COMMUNITY): Payer: Self-pay

## 2024-08-29 ENCOUNTER — Other Ambulatory Visit: Payer: Self-pay

## 2024-09-01 ENCOUNTER — Other Ambulatory Visit: Payer: Self-pay

## 2024-09-02 ENCOUNTER — Other Ambulatory Visit: Payer: Self-pay

## 2024-09-02 ENCOUNTER — Other Ambulatory Visit (HOSPITAL_COMMUNITY): Payer: Self-pay

## 2024-09-02 ENCOUNTER — Encounter: Payer: Self-pay | Admitting: Pharmacist

## 2024-09-05 ENCOUNTER — Other Ambulatory Visit (HOSPITAL_COMMUNITY): Payer: Self-pay

## 2024-09-05 ENCOUNTER — Other Ambulatory Visit: Payer: Self-pay

## 2024-09-15 ENCOUNTER — Ambulatory Visit: Attending: Cardiology | Admitting: Cardiology

## 2024-09-15 ENCOUNTER — Encounter: Payer: Self-pay | Admitting: Cardiology

## 2024-09-15 VITALS — BP 104/64 | HR 75 | Ht 73.0 in | Wt 163.2 lb

## 2024-09-15 DIAGNOSIS — I5042 Chronic combined systolic (congestive) and diastolic (congestive) heart failure: Secondary | ICD-10-CM | POA: Insufficient documentation

## 2024-09-15 DIAGNOSIS — I493 Ventricular premature depolarization: Secondary | ICD-10-CM | POA: Insufficient documentation

## 2024-09-15 DIAGNOSIS — E785 Hyperlipidemia, unspecified: Secondary | ICD-10-CM | POA: Diagnosis present

## 2024-09-15 DIAGNOSIS — I10 Essential (primary) hypertension: Secondary | ICD-10-CM | POA: Insufficient documentation

## 2024-09-15 DIAGNOSIS — I7121 Aneurysm of the ascending aorta, without rupture: Secondary | ICD-10-CM | POA: Insufficient documentation

## 2024-09-15 DIAGNOSIS — I2510 Atherosclerotic heart disease of native coronary artery without angina pectoris: Secondary | ICD-10-CM | POA: Diagnosis present

## 2024-09-15 NOTE — Progress Notes (Signed)
 Cardiology Office Note:    Date:  09/15/2024  ID:  Theodore Frank, DOB 1935-10-16, MRN [id number]  PCP:  [facility], [facility]  Cardiologist:  None  Electrophysiologist:  None   Referring MD: [facility], [facility] Complaint  Patient presents with   Congestive Heart Failure    History of Present Illness:    Theodore Frank is a 88 y.o. male with a hx of heart failure with reduced ejection fraction, colon cancer, T2DM, HTN, HLD, CKD III, COPD who presents for follow-up.[name]  He was seen in ED 01/2024 with chest pain and dyspnea.  Troponin 24>31.  BNP 417.  CTA chest showed no PE, ascending aorta aneurysm measuring 46mm, LAD calcifications, 2 small pulmonary nodules.  Echocardiogram 08/2021 showed EF 35-40%, normal RV function, mild to moderate mitral regurgitation.  Does not appear that he had any follow-up for this.  Seen for initial visit 02/17/2024.  Coronary CTA on 03/04/2024 showed mild nonobstructive CAD, calcium  score 79 (31st percentile).  Echocardiogram 03/22/2024 showed EF 25 to 30%, G2 DD, normal RV function, moderate mitral regurgitation, dilated ascending aorta measuring 43 mm.  Cardiac MRI on 04/11/2024 showed LVEF 20%, RVEF 38%, basal lateral subepicardial LGE and RV insertion site LGE; differential includes prior myocarditis or sarcoidosis (suspect prior myocarditis).  Underwent FDG PET on 06/23/2024, which showed no evidence of sarcoidosis.  Zio patch x 3 days 06/2024 showed 1 episode of NSVT lasting 4 beats, 1 episode of SVT lasting 5 minutes 45 seconds with average rate 163 bpm, frequent PVCs (9.3%)  Since last clinic visit, he reports he is doing okay. Denies any chest pain, dyspnea, lightheadedness, syncope, or palpitations.  Does report some mild swelling in his legs   Wt Readings from Last 3 Encounters:  09/15/24 163 lb 3.2 oz (74 kg)  07/11/24 161 lb 9.6 oz (73.3 kg)  04/06/24 163 lb 6.4 oz (74.1 kg)     Past Medical History:  Diagnosis Date   Acute encephalopathy 04/23/2016    Anemia    Arthritis    Cancer (HCC) 10/14/1999   colon cancer (surgery; no chemo; no radiation)   Diabetes mellitus    Hyperlipidemia    Hypertension    Prostate cancer (HCC) 10/13/1192   s/p prostatectomy   Right inguinal hernia 02/25/2012   Shortness of breath dyspnea    with exertion    Past Surgical History:  Procedure Laterality Date   COLON SURGERY  2001   colon resection for colon cancer   HERNIA REPAIR Right    INGUINAL HERNIA REPAIR  03/05/2012   Procedure: HERNIA REPAIR INGUINAL ADULT;  Surgeon: Dann FORBES Hummer, MD;  Location: [facility];  Service: General;  Laterality: Right;  Repair right inguinal hernia with mesh   LUMBAR LAMINECTOMY/DECOMPRESSION MICRODISCECTOMY Left 11/06/2014   Procedure: LEFT LUMBAR THREE-FOUR LAMINECTOMY/DECOMPRESSION MICRODISCECTOMY 1 LEVEL;  Surgeon: Arley SHAUNNA Helling, MD;  Location: [facility];  Service: Neurosurgery;  Laterality: Left;   PROSTATE SURGERY     prostatectomy for prostate cancer    Current Medications: Current Meds  Medication Sig   atorvastatin  (LIPITOR) 20 MG tablet Take 1 tablet (20 mg total) by mouth daily.   empagliflozin  (JARDIANCE ) 10 MG TABS tablet Take 1 tablet (10 mg total) by mouth daily before breakfast.   ferrous sulfate 325 (65 FE) MG EC tablet Take 650 mg by mouth daily at 6 (six) AM.   gabapentin  (NEURONTIN ) 100 MG capsule Take 200 mg by mouth at bedtime.   metoprolol   succinate (TOPROL -XL) 50 MG 24 hr tablet Take 1 tablet (50 mg total) by mouth daily. Take with or immediately following a meal.   sacubitril -valsartan  (ENTRESTO ) 49-51 MG Take 1 tablet by mouth 2 (two) times daily.   spironolactone  (ALDACTONE ) 25 MG tablet Take 1/2 tablets (12.5 mg total) by mouth daily.   vitamin B-12 (CYANOCOBALAMIN) 1000 MCG tablet Take 1 tablet (1,000 mcg total) by mouth daily.   [DISCONTINUED] amLODipine  (NORVASC ) 10 MG tablet Take 10 mg by mouth daily.     Allergies:   Patient has no known allergies.   Social  History   Socioeconomic History   Marital status: Divorced    Spouse name: Not on file   Number of children: 4   Years of education: 12   Highest education level: Not on file  Occupational History   Occupation: Research Scientist (physical Sciences)  Tobacco Use   Smoking status: Former    Current packs/day: 0.00    Types: Cigarettes    Quit date: 10/13/1965    Years since quitting: 58.9   Smokeless tobacco: Never  Vaping Use   Vaping status: Never Used  Substance and Sexual Activity   Alcohol use: No   Drug use: No   Sexual activity: Yes    Birth control/protection: None  Other Topics Concern   Not on file  Social History Narrative   Marital status: divorced; single in 2018; not dating      Children: 4 total children; 1 son died of AIDS; 3 living children       Live:Lives alone      Employment: truck hospital doctor; loss adjuster, chartered; psychologist, clinical; local      Tobacco: previous smoker; quit in 1967      Alcohol: none      Exercise: none; scared to exercise      ADLs: independent with ADLs; drives      Advanced Directives:  None; desires FULL CODE         Right-handed   Only occasional coffee or hot tea   Social Drivers of Corporate Investment Banker Strain: Low Risk  (11/17/2017)   Overall Financial Resource Strain (CARDIA)    Difficulty of Paying Living Expenses: Not hard at all  Food Insecurity: No Food Insecurity (11/17/2017)   Hunger Vital Sign    Worried About Running Out of Food in the Last Year: Never true    Ran Out of Food in the Last Year: Never true  Transportation Needs: No Transportation Needs (11/17/2017)   PRAPARE - Administrator, Civil Service (Medical): No    Lack of Transportation (Non-Medical): No  Physical Activity: Inactive (11/17/2017)   Exercise Vital Sign    Days of Exercise per Week: 0 days    Minutes of Exercise per Session: 0 min  Stress: No Stress Concern Present (11/17/2017)   [employer] of Occupational Health - Occupational Stress Questionnaire     Feeling of Stress : Not at all  Social Connections: Moderately Isolated (11/17/2017)   Social Connection and Isolation Panel    Frequency of Communication with Friends and Family: Never    Frequency of Social Gatherings with Friends and Family: Never    Attends Religious Services: More than 4 times per year    Active Member of [employer] or Organizations: No    Attends Banker Meetings: Never    Marital Status: Divorced     Family History: The patient's family history includes Diabetes in an other family member; Hypertension  in his mother; Thyroid  disease in his sister.  ROS:   Please see the history of present illness.     All other systems reviewed and are negative.  EKGs/Labs/Other Studies Reviewed:    The following studies were reviewed today:   EKG:   02/17/2024: Normal sinus rhythm, LVH with repolarization abnormalities, rate 88 07/12/2024: Sinus rhythm, PVCs, LVH with repolarization abnormalities, rate 75   Recent Labs: 02/09/2024: B Natriuretic Peptide 417.2 04/12/2024: Hemoglobin 14.3; Platelets 179 07/11/2024: BUN 21; Creatinine, Ser 1.38; Magnesium 2.3; Potassium 4.0; Sodium 142  Recent Lipid Panel    Component Value Date/Time   CHOL 164 02/18/2024 0810   TRIG 107 02/18/2024 0810   HDL 57 02/18/2024 0810   CHOLHDL 2.9 02/18/2024 0810   CHOLHDL 3.5 02/29/2016 0959   VLDL 18 02/29/2016 0959   LDLCALC 88 02/18/2024 0810    Physical Exam:    VS:  BP 104/64 (BP Location: Left Arm, Patient Position: Sitting, Cuff Size: Normal)   Pulse 75   Ht 6' 1 (1.854 m)   Wt 163 lb 3.2 oz (74 kg)   SpO2 97%   BMI 21.53 kg/m     Wt Readings from Last 3 Encounters:  09/15/24 163 lb 3.2 oz (74 kg)  07/11/24 161 lb 9.6 oz (73.3 kg)  04/06/24 163 lb 6.4 oz (74.1 kg)     GEN:  Well nourished, well developed in no acute distress HEENT: Normal NECK: No JVD; No carotid bruits LYMPHATICS: No lymphadenopathy CARDIAC: RRR, no murmurs, rubs, gallops RESPIRATORY:  Clear to  auscultation without rales, wheezing or rhonchi  ABDOMEN: Soft, non-tender, non-distended MUSCULOSKELETAL:  No edema; No deformity  SKIN: Warm and dry NEUROLOGIC:  Alert and oriented x 3 PSYCHIATRIC:  Normal affect   ASSESSMENT:    1. Chronic combined systolic and diastolic heart failure (HCC)   2. Coronary artery disease involving native coronary artery of native heart without angina pectoris   3. Frequent PVCs   4. Aneurysm of ascending aorta without rupture   5. Essential hypertension   6. Hyperlipidemia, unspecified hyperlipidemia type     PLAN:    Chronic combined heart failure: Echocardiogram 08/2021 showed EF 35-40%, normal RV function, mild to moderate mitral regurgitation.  Does not appear he has not had heart failure follow-up.  Coronary CTA on 03/04/2024 showed mild nonobstructive CAD, calcium  score 79 (31st percentile).  Echocardiogram 03/22/2024 showed EF 25 to 30%, G2 DD, normal RV function, moderate mitral regurgitation, dilated ascending aorta measuring 43 mm.  Cardiac MRI on 04/11/2024 showed LVEF 20%, RVEF 38%, basal lateral subepicardial LGE and RV insertion site LGE; differential includes prior myocarditis or sarcoidosis (suspect prior myocarditis).  Underwent FDG PET on 06/23/2024, which showed no evidence of sarcoidosis. -Appears euvolemic.  On as needed lasix  20 mg.  Recommend monitoring daily weights and can take Lasix  20 mg as needed if gains more than 3 pounds in 1 day or 5 pounds in 1 week.  Reports has not needed to take Lasix  -Heart failure regimen is supposed to be Entresto  49 to 51 mg twice daily, Toprol -XL 50 mg daily, Jardiance  10 mg daily, and spironolactone  12.5 mg daily.  He is unsure though what medications he is taking.  Have previously asked him to bring his medications with him to appointment.  Recommend scheduling in pharmacy heart failure clinic, asked him to bring his medications with him so we can ensure he is taking medications appropriately -Check BMET,  magnesium -Will update echocardiogram prior to next clinic  visit  CAD: Coronary CTA on 03/04/2024 showed mild nonobstructive CAD, calcium  score 79 (31st percentile).  -Continue atorvastatin   Frequent PVCs: Zio patch x 3 days 06/2024 showed 1 episode of NSVT lasting 4 beats, 1 episode of SVT lasting 5 minutes 45 seconds with average rate 163 bpm, frequent PVCs (9.3%) - Continue Toprol -XL 50 mg daily  Ascending aortic aneurysm: Measured 46 mm on CTA 01/2024.  Will follow  Hypertension: on Entresto , Toprol -XL, spironolactone  as above  Hyperlipidemia: on atorvastatin  20 mg daily.  LDL 88 on 02/18/24  T2DM: on metformin .  A1c 5.9% on 02/18/24   RTC in 3 months   Medication Adjustments/Labs and Tests Ordered: Current medicines are reviewed at length with the patient today.  Concerns regarding medicines are outlined above.  Orders Placed This Encounter  Procedures   Basic metabolic panel with GFR   Magnesium   AMB Referral to [facility] Pharm-D   ECHOCARDIOGRAM COMPLETE   No orders of the defined types were placed in this encounter.   Patient Instructions  Medication Instructions:  No changes *If you need a refill on your cardiac medications before your next appointment, please call your pharmacy*  Lab Work: BMP, Mag If you have labs (blood work) drawn today and your tests are completely normal, you will receive your results only by: MyChart Message (if you have MyChart) OR A paper copy in the mail If you have any lab test that is abnormal or we need to change your treatment, we will call you to review the results.  Testing/Procedures: Your physician has requested that you have an echocardiogram in March 2026. Echocardiography is a painless test that uses sound waves to create images of your heart. It provides your doctor with information about the size and shape of your heart and how well your heart's chambers and valves are working. This procedure takes approximately one hour. There  are no restrictions for this procedure. Please do NOT wear cologne, perfume, aftershave, or lotions (deodorant is allowed). Please arrive 15 minutes prior to your appointment time.  Please note: We ask at that you not bring children with you during ultrasound (echo/ vascular) testing. Due to room size and safety concerns, children are not allowed in the ultrasound rooms during exams. Our front office staff cannot provide observation of children in our lobby area while testing is being conducted. An adult accompanying a patient to their appointment will only be allowed in the ultrasound room at the discretion of the ultrasound technician under special circumstances. We apologize for any inconvenience.   Follow-Up: At [facility], you and your health needs are our priority.  As part of our continuing mission to provide you with exceptional heart care, our providers are all part of one team.  This team includes your primary Cardiologist (physician) and Advanced Practice Providers or APPs (Physician Assistants and Nurse Practitioners) who all work together to provide you with the care you need, when you need it.  Your next appointment:   Pharm D appointment for Heart Failure Clinic- Bring all of your medications to this appointment  Dr Kate- 3 Months (After Echo)  We recommend signing up for the patient portal called MyChart.  Sign up information is provided on this After Visit Summary.  MyChart is used to connect with patients for Virtual Visits (Telemedicine).  Patients are able to view lab/test results, encounter notes, upcoming appointments, etc.  Non-urgent messages can be sent to your provider as well.   To learn more about what you  can do with MyChart, go to [email or web address].      Signed, Lonni LITTIE Nanas, MD  09/15/2024 3:15 PM    [facility]

## 2024-09-15 NOTE — Patient Instructions (Signed)
 Medication Instructions:  No changes *If you need a refill on your cardiac medications before your next appointment, please call your pharmacy*  Lab Work: BMP, Mag If you have labs (blood work) drawn today and your tests are completely normal, you will receive your results only by: MyChart Message (if you have MyChart) OR A paper copy in the mail If you have any lab test that is abnormal or we need to change your treatment, we will call you to review the results.  Testing/Procedures: Your physician has requested that you have an echocardiogram in March 2026. Echocardiography is a painless test that uses sound waves to create images of your heart. It provides your doctor with information about the size and shape of your heart and how well your heart's chambers and valves are working. This procedure takes approximately one hour. There are no restrictions for this procedure. Please do NOT wear cologne, perfume, aftershave, or lotions (deodorant is allowed). Please arrive 15 minutes prior to your appointment time.  Please note: We ask at that you not bring children with you during ultrasound (echo/ vascular) testing. Due to room size and safety concerns, children are not allowed in the ultrasound rooms during exams. Our front office staff cannot provide observation of children in our lobby area while testing is being conducted. An adult accompanying a patient to their appointment will only be allowed in the ultrasound room at the discretion of the ultrasound technician under special circumstances. We apologize for any inconvenience.   Follow-Up: At [facility], you and your health needs are our priority.  As part of our continuing mission to provide you with exceptional heart care, our providers are all part of one team.  This team includes your primary Cardiologist (physician) and Advanced Practice Providers or APPs (Physician Assistants and Nurse Practitioners) who all work together to  provide you with the care you need, when you need it.  Your next appointment:   Pharm D appointment for Heart Failure Clinic- Bring all of your medications to this appointment  Dr Kate- 3 Months (After Echo)  We recommend signing up for the patient portal called MyChart.  Sign up information is provided on this After Visit Summary.  MyChart is used to connect with patients for Virtual Visits (Telemedicine).  Patients are able to view lab/test results, encounter notes, upcoming appointments, etc.  Non-urgent messages can be sent to your provider as well.   To learn more about what you can do with MyChart, go to [email or web address].

## 2024-09-16 ENCOUNTER — Ambulatory Visit: Payer: Self-pay | Admitting: Cardiology

## 2024-09-16 LAB — BASIC METABOLIC PANEL WITH GFR
BUN/Creatinine Ratio: 15 (ref 10–24)
BUN: 24 mg/dL (ref 8–27)
CO2: 18 mmol/L — ABNORMAL LOW (ref 20–29)
Calcium: 11 mg/dL — ABNORMAL HIGH (ref 8.6–10.2)
Chloride: 107 mmol/L — ABNORMAL HIGH (ref 96–106)
Creatinine, Ser: 1.63 mg/dL — ABNORMAL HIGH (ref 0.76–1.27)
Glucose: 108 mg/dL — ABNORMAL HIGH (ref 70–99)
Potassium: 4.7 mmol/L (ref 3.5–5.2)
Sodium: 145 mmol/L — ABNORMAL HIGH (ref 134–144)
eGFR: 41 mL/min/1.73 — ABNORMAL LOW (ref >59)

## 2024-09-16 LAB — MAGNESIUM: Magnesium: 2.3 mg/dL (ref 1.6–2.3)

## 2024-09-21 ENCOUNTER — Other Ambulatory Visit (HOSPITAL_COMMUNITY): Payer: Self-pay

## 2024-09-23 ENCOUNTER — Telehealth: Payer: Self-pay | Admitting: Cardiology

## 2024-09-23 ENCOUNTER — Other Ambulatory Visit (HOSPITAL_COMMUNITY): Payer: Self-pay

## 2024-09-23 NOTE — Telephone Encounter (Signed)
°*  STAT* If patient is at the pharmacy, call can be transferred to refill team.   1. Which medications need to be refilled? (please list name of each medication and dose if known)   atorvastatin  (LIPITOR) 20 MG tablet    spironolactone  (ALDACTONE ) 25 MG tablet    2. Which pharmacy/location (including street and city if local pharmacy) is medication to be sent to?  [place] [facility]      3. Do they need a 30 day or 90 day supply? 90 day

## 2024-09-23 NOTE — Telephone Encounter (Signed)
 Returned call to pt, he has been made aware that his refills was sent to [facility] 07/11/24.  He said he contacted them asking for a refill and was told it was too early.  Called pharmacy, 09/06/24, he got 30 days filled on both.  Called pt back and he verified his bottle said 09/06/2024.  He will call back if he needs a week or 2 supply to be sent in for him.

## 2024-09-26 ENCOUNTER — Other Ambulatory Visit: Payer: Self-pay

## 2024-09-27 ENCOUNTER — Other Ambulatory Visit (HOSPITAL_COMMUNITY): Payer: Self-pay

## 2024-09-28 ENCOUNTER — Telehealth (HOSPITAL_COMMUNITY): Payer: Self-pay

## 2024-09-28 ENCOUNTER — Other Ambulatory Visit: Payer: Self-pay

## 2024-09-28 ENCOUNTER — Other Ambulatory Visit (HOSPITAL_COMMUNITY): Payer: Self-pay

## 2024-09-28 NOTE — Telephone Encounter (Signed)
 Verifying eligibility

## 2024-09-29 ENCOUNTER — Other Ambulatory Visit (HOSPITAL_COMMUNITY): Payer: Self-pay

## 2024-09-30 ENCOUNTER — Other Ambulatory Visit: Payer: Self-pay

## 2024-09-30 ENCOUNTER — Other Ambulatory Visit (HOSPITAL_COMMUNITY): Payer: Self-pay

## 2024-10-03 ENCOUNTER — Other Ambulatory Visit: Payer: Self-pay | Admitting: *Deleted

## 2024-10-03 DIAGNOSIS — I10 Essential (primary) hypertension: Secondary | ICD-10-CM

## 2024-10-03 LAB — BASIC METABOLIC PANEL WITH GFR
BUN/Creatinine Ratio: 13 (ref 10–24)
BUN: 22 mg/dL (ref 8–27)
CO2: 22 mmol/L (ref 20–29)
Calcium: 11.1 mg/dL — ABNORMAL HIGH (ref 8.6–10.2)
Chloride: 108 mmol/L — ABNORMAL HIGH (ref 96–106)
Creatinine, Ser: 1.64 mg/dL — ABNORMAL HIGH (ref 0.76–1.27)
Glucose: 99 mg/dL (ref 70–99)
Potassium: 4.1 mmol/L (ref 3.5–5.2)
Sodium: 143 mmol/L (ref 134–144)
eGFR: 40 mL/min/1.73 — ABNORMAL LOW

## 2024-10-04 ENCOUNTER — Other Ambulatory Visit (HOSPITAL_COMMUNITY): Payer: Self-pay

## 2024-10-04 ENCOUNTER — Ambulatory Visit: Payer: Self-pay | Admitting: Cardiology

## 2024-10-05 ENCOUNTER — Other Ambulatory Visit: Payer: Self-pay

## 2024-10-07 ENCOUNTER — Other Ambulatory Visit: Payer: Self-pay

## 2024-10-10 ENCOUNTER — Other Ambulatory Visit: Payer: Self-pay

## 2024-10-18 ENCOUNTER — Other Ambulatory Visit (HOSPITAL_COMMUNITY): Payer: Self-pay

## 2024-10-26 ENCOUNTER — Telehealth: Payer: Self-pay | Admitting: Cardiology

## 2024-10-26 NOTE — Telephone Encounter (Signed)
 Pt c/o medication issue:  1. Name of Medication: empagliflozin  (JARDIANCE ) 10 MG TABS tablet   2. How are you currently taking this medication (dosage and times per day)?    3. Are you having a reaction (difficulty breathing--STAT)? no  4. What is your medication issue? States a prior shara is needed.Fax # [phone] ref [id number]. Please advise

## 2024-10-27 ENCOUNTER — Other Ambulatory Visit (HOSPITAL_COMMUNITY): Payer: Self-pay

## 2024-10-27 ENCOUNTER — Telehealth: Payer: Self-pay | Admitting: Pharmacy Technician

## 2024-10-27 NOTE — Telephone Encounter (Signed)
 Faxed [facility]

## 2024-10-27 NOTE — Telephone Encounter (Signed)
 Theodore Frank

## 2024-10-28 ENCOUNTER — Other Ambulatory Visit: Payer: Self-pay

## 2024-10-28 ENCOUNTER — Other Ambulatory Visit (HOSPITAL_COMMUNITY): Payer: Self-pay

## 2024-10-30 ENCOUNTER — Other Ambulatory Visit: Payer: Self-pay

## 2024-10-31 ENCOUNTER — Other Ambulatory Visit: Payer: Self-pay

## 2024-11-07 ENCOUNTER — Other Ambulatory Visit (HOSPITAL_COMMUNITY): Payer: Self-pay

## 2024-11-07 NOTE — Telephone Encounter (Signed)
 Pharmacy Patient Advocate Encounter  Received notification from [facility] that Prior Authorization for Jardiance  has been APPROVED from 11/07/24 to 11/07/25. Unable to obtain price due to refill too soon rejection, last fill date 11/07/24 next available fill date02/10/26   PA #/Case ID/Reference #: fax

## 2024-11-13 NOTE — Progress Notes (Unsigned)
 Patient ID: Theodore Frank                 DOB: [birth year]                      MRN: [id number]     HPI: Theodore Frank is a 89 y.o. male referred by Dr. PIERRETTE to pharmacy clinic for HF medication management. PMH is significant for ***. Most recent LVEF *** on ***.  Discussion with patient today included the following: cardiac medication indications, introduction to GDMT clinic, reasoning behind medication titration, importance of medication adherence, and patient engagement. . At last visit with MD ***. Symptomatically, she is feeling ***, *** dizziness, lightheadedness, and fatigue. *** chest pain or palpitations. Feels SOB when ***. Able to complete all ADLs. Activity level ***. She *** checks her weight at home (normal range *** - *** lbs). *** [name], PND, or orthopnea. Appetite has been ***. She *** adheres to a low-salt diet.      Current CHF meds:  Previously tried:  Adherence Assessment  Do you ever forget to take your medication? [] Yes [] No  Do you ever skip doses due to side effects? [] Yes [] No  Do you have trouble affording your medicines? [] Yes [] No  Are you ever unable to pick up your medication due to transportation difficulties? [] Yes [] No  Do you ever stop taking your medications because you don't believe they are helping? [] Yes [] No  Do you check your weight daily? [] Yes [] No   Adherence strategy: ***  Barriers to obtaining medications: ***  BP goal:   Family History:   Social History:   Diet:   Exercise:   Home BP readings:   Wt Readings from Last 3 Encounters:  09/15/24 163 lb 3.2 oz (74 kg)  07/11/24 161 lb 9.6 oz (73.3 kg)  04/06/24 163 lb 6.4 oz (74.1 kg)   BP Readings from Last 3 Encounters:  09/15/24 104/64  07/11/24 138/70  05/09/24 122/80   Pulse Readings from Last 3 Encounters:  09/15/24 75  07/11/24 84  05/09/24 84    Renal function: CrCl cannot be calculated (Patient's most recent lab result is older than the maximum 21 days  allowed.).  Past Medical History:  Diagnosis Date   Acute encephalopathy 04/23/2016   Anemia    Arthritis    Cancer (HCC) 10/14/1999   colon cancer (surgery; no chemo; no radiation)   Diabetes mellitus    Hyperlipidemia    Hypertension    Prostate cancer (HCC) 10/13/1192   s/p prostatectomy   Right inguinal hernia 02/25/2012   Shortness of breath dyspnea    with exertion    Medications Ordered Prior to Encounter[1]  Allergies[2]   Assessment/Plan:  1. CHF -  No problems updated. No problem-specific Assessment & Plan notes found for this encounter.      Thank you   Robbi Blanch, Pharm.D [facility]. [facility] [address], [place], [place] [address] Phone: [phone]; Fax: [phone]     [1]  Current Outpatient Medications on File Prior to Visit  Medication Sig Dispense Refill   atorvastatin  (LIPITOR) 20 MG tablet Take 1 tablet (20 mg total) by mouth daily. 90 tablet 3   empagliflozin  (JARDIANCE ) 10 MG TABS tablet Take 1 tablet (10 mg total) by mouth daily before breakfast. 90 tablet 3   ferrous sulfate 325 (65 FE) MG EC tablet Take 650 mg by mouth daily at 6 (six) AM.  furosemide  (LASIX ) 20 MG tablet Take 1 tablet (20 mg total) by mouth as needed for edema (As needed for shortness of breath at night while laying flat or leg swelling). (Patient not taking: Reported on 09/15/2024) 90 tablet 1   gabapentin  (NEURONTIN ) 100 MG capsule Take 200 mg by mouth at bedtime.     metoprolol  succinate (TOPROL -XL) 50 MG 24 hr tablet Take 1 tablet (50 mg total) by mouth daily. Take with or immediately following a meal. 90 tablet 3   metoprolol  succinate (TOPROL -XL) 50 MG 24 hr tablet Take 1 tablet (50 mg total) by mouth daily. Take with or immediately following a meal. 90 tablet 3   potassium chloride  SA (KLOR-CON  M) 20 MEQ tablet Take 1 tablet (20 mEq total) by mouth as needed. (Take 1 tablet of potassium anytime that you take  20mg  of lasix ). (Patient not taking: Reported on 09/15/2024) 90 tablet 1   sacubitril -valsartan  (ENTRESTO ) 49-51 MG Take 1 tablet by mouth 2 (two) times daily. 180 tablet 3   spironolactone  (ALDACTONE ) 25 MG tablet Take 1/2 tablets (12.5 mg total) by mouth daily. 45 tablet 3   vitamin B-12 (CYANOCOBALAMIN) 1000 MCG tablet Take 1 tablet (1,000 mcg total) by mouth daily. 90 tablet 1   No current facility-administered medications on file prior to visit.  [2] No Known Allergies

## 2024-11-14 ENCOUNTER — Telehealth: Payer: Self-pay | Admitting: Pharmacy Technician

## 2024-11-14 ENCOUNTER — Other Ambulatory Visit: Payer: Self-pay

## 2024-11-14 ENCOUNTER — Other Ambulatory Visit (HOSPITAL_COMMUNITY): Payer: Self-pay

## 2024-11-14 ENCOUNTER — Ambulatory Visit: Admitting: Pharmacist

## 2024-11-14 ENCOUNTER — Encounter: Payer: Self-pay | Admitting: Pharmacist

## 2024-11-14 VITALS — BP 104/67 | HR 69

## 2024-11-14 DIAGNOSIS — I5020 Unspecified systolic (congestive) heart failure: Secondary | ICD-10-CM

## 2024-11-14 MED ORDER — SPIRONOLACTONE 25 MG PO TABS
25.0000 mg | ORAL_TABLET | Freq: Every day | ORAL | 3 refills | Status: AC
  Filled 2024-11-14: qty 90, 90d supply, fill #0

## 2024-11-17 NOTE — Telephone Encounter (Signed)
 Pt informed about Lorrene.

## 2024-11-18 ENCOUNTER — Telehealth: Payer: Self-pay | Admitting: Pharmacist

## 2024-11-18 NOTE — Telephone Encounter (Signed)
 Patient call requesting to speak to me. Was busy with patient. Call him back on mobil and home phone, N/A on both LVM on home phone number

## 2024-12-12 ENCOUNTER — Ambulatory Visit (HOSPITAL_COMMUNITY)
